# Patient Record
Sex: Female | Born: 1950
Health system: Southern US, Community
[De-identification: ages and names within clinical notes are randomized; demographics above are authoritative.]

## PROBLEM LIST (undated history)

## (undated) DIAGNOSIS — R51 Headache: Secondary | ICD-10-CM

## (undated) DIAGNOSIS — F419 Anxiety disorder, unspecified: Secondary | ICD-10-CM

## (undated) DIAGNOSIS — K219 Gastro-esophageal reflux disease without esophagitis: Secondary | ICD-10-CM

## (undated) DIAGNOSIS — R0602 Shortness of breath: Secondary | ICD-10-CM

## (undated) DIAGNOSIS — M1990 Unspecified osteoarthritis, unspecified site: Secondary | ICD-10-CM

## (undated) DIAGNOSIS — E079 Disorder of thyroid, unspecified: Secondary | ICD-10-CM

## (undated) HISTORY — DX: Disorder of thyroid, unspecified: E07.9

## (undated) HISTORY — DX: Gastro-esophageal reflux disease without esophagitis: K21.9

## (undated) HISTORY — DX: Unspecified osteoarthritis, unspecified site: M19.90

---
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
## 1998-07-25 ENCOUNTER — Emergency Department (HOSPITAL_COMMUNITY): Admission: EM | Admit: 1998-07-25 | Discharge: 1998-07-25 | Payer: Self-pay | Admitting: Emergency Medicine

## 1998-07-26 ENCOUNTER — Observation Stay (HOSPITAL_COMMUNITY): Admission: EM | Admit: 1998-07-26 | Discharge: 1998-07-27 | Payer: Self-pay | Admitting: Emergency Medicine

## 1998-10-12 ENCOUNTER — Ambulatory Visit (HOSPITAL_COMMUNITY): Admission: RE | Admit: 1998-10-12 | Discharge: 1998-10-12 | Payer: Self-pay | Admitting: Cardiology

## 1998-11-12 ENCOUNTER — Ambulatory Visit (HOSPITAL_COMMUNITY): Admission: RE | Admit: 1998-11-12 | Discharge: 1998-11-12 | Payer: Self-pay | Admitting: Family Medicine

## 1998-12-15 HISTORY — PX: MYOMECTOMY: SHX85

## 2000-03-03 ENCOUNTER — Other Ambulatory Visit: Admission: RE | Admit: 2000-03-03 | Discharge: 2000-03-03 | Payer: Self-pay | Admitting: *Deleted

## 2000-04-15 ENCOUNTER — Ambulatory Visit (HOSPITAL_COMMUNITY): Admission: RE | Admit: 2000-04-15 | Discharge: 2000-04-15 | Payer: Self-pay | Admitting: Family Medicine

## 2000-04-15 ENCOUNTER — Encounter: Payer: Self-pay | Admitting: Family Medicine

## 2002-05-23 ENCOUNTER — Encounter: Payer: Self-pay | Admitting: Family Medicine

## 2002-05-23 ENCOUNTER — Ambulatory Visit (HOSPITAL_COMMUNITY): Admission: RE | Admit: 2002-05-23 | Discharge: 2002-05-23 | Payer: Self-pay | Admitting: Family Medicine

## 2003-03-03 ENCOUNTER — Ambulatory Visit (HOSPITAL_COMMUNITY): Admission: RE | Admit: 2003-03-03 | Discharge: 2003-03-03 | Payer: Self-pay | Admitting: Gynecology

## 2003-03-03 ENCOUNTER — Encounter (INDEPENDENT_AMBULATORY_CARE_PROVIDER_SITE_OTHER): Payer: Self-pay

## 2003-09-04 ENCOUNTER — Ambulatory Visit (HOSPITAL_COMMUNITY): Admission: RE | Admit: 2003-09-04 | Discharge: 2003-09-04 | Payer: Self-pay | Admitting: Family Medicine

## 2003-09-04 ENCOUNTER — Encounter: Payer: Self-pay | Admitting: Family Medicine

## 2004-08-16 ENCOUNTER — Other Ambulatory Visit: Admission: RE | Admit: 2004-08-16 | Discharge: 2004-08-16 | Payer: Self-pay | Admitting: *Deleted

## 2005-11-20 ENCOUNTER — Other Ambulatory Visit: Admission: RE | Admit: 2005-11-20 | Discharge: 2005-11-20 | Payer: Self-pay | Admitting: *Deleted

## 2006-12-24 ENCOUNTER — Other Ambulatory Visit: Admission: RE | Admit: 2006-12-24 | Discharge: 2006-12-24 | Payer: Self-pay | Admitting: *Deleted

## 2008-01-04 ENCOUNTER — Other Ambulatory Visit: Admission: RE | Admit: 2008-01-04 | Discharge: 2008-01-04 | Payer: Self-pay | Admitting: *Deleted

## 2009-01-25 ENCOUNTER — Other Ambulatory Visit: Admission: RE | Admit: 2009-01-25 | Discharge: 2009-01-25 | Payer: Self-pay | Admitting: Family Medicine

## 2010-01-31 ENCOUNTER — Other Ambulatory Visit: Admission: RE | Admit: 2010-01-31 | Discharge: 2010-01-31 | Payer: Self-pay | Admitting: Family Medicine

## 2010-02-19 ENCOUNTER — Other Ambulatory Visit: Admission: RE | Admit: 2010-02-19 | Discharge: 2010-02-19 | Payer: Self-pay | Admitting: Family Medicine

## 2011-01-06 ENCOUNTER — Other Ambulatory Visit: Payer: Self-pay | Admitting: Family Medicine

## 2011-01-06 ENCOUNTER — Other Ambulatory Visit
Admission: RE | Admit: 2011-01-06 | Discharge: 2011-01-06 | Payer: Self-pay | Source: Home / Self Care | Admitting: Family Medicine

## 2011-01-29 ENCOUNTER — Other Ambulatory Visit: Payer: Self-pay | Admitting: Family Medicine

## 2011-01-29 DIAGNOSIS — Z1231 Encounter for screening mammogram for malignant neoplasm of breast: Secondary | ICD-10-CM

## 2011-01-29 DIAGNOSIS — Z780 Asymptomatic menopausal state: Secondary | ICD-10-CM

## 2011-05-02 NOTE — H&P (Signed)
NAME:  Kimberly Ryan, Kimberly Ryan                     ACCOUNT NO.:  [id number]   MEDICAL RECORD NO.:  [id number]                   PATIENT TYPE:   LOCATION:                                       FACILITY:   PHYSICIAN:  Ivor Costa. Farrel Gobble, M.D.              DATE OF BIRTH:   DATE OF ADMISSION:  DATE OF DISCHARGE:                                HISTORY & PHYSICAL   CHIEF COMPLAINT:  Postmenopausal bleeding.   HISTORY OF PRESENT ILLNESS:  The patient is a 60 year old [place] who had  been menopausal since December of 2002 and began having scattered red  painless spotting in the ensuing year and then began to have a very heavy  cycle back in November of 2003, which lasted for eight days. She had an  ultrasound performed that showed two uterine fibroids that measured 2.4 and  2.2 cm. She also was noted to have slightly thickened endometrium. She had  an ultrasound with a sono histogram performed in our office which confirmed  the presence of the two fibroids. The sono histogram, however, showed an  anterior wall polyp that measured 25 x 10 x 12 mm with a thickened base. ENB  performed and she had proliferative endometrium with areas of hyperplasia  without atypia and benign polyp. The patient had presented to the office for  laminaria placement on the 18th, in order to undergo D&C hysteroscopy on the  19th.   OB/GYN HISTORY:  She had regular menses and menopause. She has never been  pregnant. She has never been on hormone replacement.   PAST MEDICAL HISTORY:  Significant for some insomnia and left leg numbness.   PAST SURGICAL HISTORY:  Tonsillectomy in 1953 and rhinoplasty in 1952.   MEDICATIONS:  Remeron, Ambien, Zantac, Xanax, multivitamin, calcium, fish  oil and zinc.   ALLERGIES:  No known drug allergies.   SOCIAL HISTORY:  She is married. She drinks alcohol socially. No tobacco.  Light caffeine. Exercises five times per week.   FAMILY HISTORY:  Negative for breast, ovarian,  uterine, and colon cancer.   PHYSICAL EXAMINATION:  GENERAL: A well appearing female in no acute  distress.  HEART: Regular rate and rhythm.  LUNGS: Clear to auscultation and percussion.  ABDOMEN: Soft without mass or hernia.  GU: On GYN examination, she has normal female genitalia with slight  hypopigmentation. The BUS is negative. Vagina pink and moist. Bladder was  supported. Cervix without lesions. Bimanual examination reveals the uterus  is slightly bulky, mobile and nontender. Perineum is intact. By ultrasound,  her uterus measures 9.4 x 4.8. It is anteverted. Adnexa are negative.    ASSESSMENT:  Postmenopausal bleeding with evidence of an endometrial polyp.  The patient will present for [facility], hysteroscopy on the 19th. All questions  were addressed. Again, she had laminaria placed the day before.  Ivor Costa. Farrel Gobble, M.D.    THL/MEDQ  D:  03/02/2003  T:  03/02/2003  Job:  [id number]

## 2011-05-02 NOTE — Op Note (Signed)
NAME:  Kimberly Ryan, Kimberly Ryan                   ACCOUNT NO.:  [id number]   MEDICAL RECORD NO.:  [id number]                   PATIENT TYPE:  AMB   LOCATION:  [facility]                                  FACILITY:  [facility]   PHYSICIAN:  Ivor Costa. Farrel Gobble, M.D.              DATE OF BIRTH:  February 11, 1951   DATE OF PROCEDURE:  03/03/2003  DATE OF DISCHARGE:                                 OPERATIVE REPORT   PREOPERATIVE DIAGNOSES:  1. Postmenopausal bleeding.  2. Simple hyperplasia without atypia.  3. Endometrial polyp.   POSTOPERATIVE DIAGNOSES:  1. Postmenopausal bleeding.  2. Simple hyperplasia without atypia.  3. Endometrial polyp.   PROCEDURE:  1. Dilatation and curettage.  2. Hysteroscopy with resectoscope.   SURGEON:  Ivor Costa. Farrel Gobble, M.D.   ANESTHESIA:  General.   I&O DEFICIT:  3% sorbitol solution was 20 mL.   FINDINGS:  The uterine cavity was noted to be hyperemic sounding to 10.  There was a large anterior wall polyp.   PATHOLOGY:  Endometrial curettings and endometrial polyp.   COMPLICATIONS:  None.   PROCEDURE:  The patient was taken to the operating room.  Placed in a dorsal  lithotomy position prior to anesthesia secondary to leg/hip issue.  Once  comfortable then general anesthesia was induced.  Prior to prepping the  vagina the laminaria placed the night before was removed.  She was prepped  and draped in usual sterile fashion.  A sterile weighted speculum was placed  in the vagina.  The cervix was visualized, stabilized with a single tooth  tenaculum.  The cervix was noted to be dilated beyond the 22 [place] required  for the operative scope.  Therefore, the operative scope was advanced  through the cervix.  Immediately upon entering the cavity a large  endometrial polyp was noted on the anterior wall.  We were unable to  visualize the fundus.  The polyp was quite obstructive.  The polyp was then  removed with double loop cut cautery system.  After inspection and  confirmation that the polyp had been removed, the resectoscope was placed  back in the cavity.  The cavity was noted to be markedly hyperemic to the  point that the uterine fundus could not be visualized.  Unfortunately,  suboptimal seal because of over dilation from the laminaria would not allow  for good clearance of the blood with good uterine distention but the lining  did appear to be quite thickened.  An endometrial curetting was then  performed using a curette.  The resectoscope was advanced back through.  However, the cavity still remained dark and hyperemic.  However, the polyp  appeared to have been resected in its entirety and there was no bleeding  from the operative site.  Because we were unable to  clear the blood adequately and adequate samples were taken, we decided to  abort procedure at this point.  The instruments were removed.  The cervix  was stable.  The patient was extubated in the OR and transferred to the PACU  in stable condition.                                               Ivor Costa. Farrel Gobble, M.D.    THL/MEDQ  D:  03/03/2003  T:  03/03/2003  Job:  [id number]

## 2011-11-05 ENCOUNTER — Other Ambulatory Visit: Payer: Self-pay | Admitting: Family Medicine

## 2011-11-05 DIAGNOSIS — R599 Enlarged lymph nodes, unspecified: Secondary | ICD-10-CM

## 2011-11-12 ENCOUNTER — Ambulatory Visit
Admission: RE | Admit: 2011-11-12 | Discharge: 2011-11-12 | Disposition: A | Discharge status: Home / Self Care | Payer: BC Managed Care – PPO | Source: Ambulatory Visit | Attending: Family Medicine | Admitting: Family Medicine

## 2011-11-12 DIAGNOSIS — R599 Enlarged lymph nodes, unspecified: Secondary | ICD-10-CM

## 2011-12-04 ENCOUNTER — Ambulatory Visit (INDEPENDENT_AMBULATORY_CARE_PROVIDER_SITE_OTHER): Payer: BC Managed Care – PPO | Admitting: General Surgery

## 2011-12-17 ENCOUNTER — Encounter (INDEPENDENT_AMBULATORY_CARE_PROVIDER_SITE_OTHER): Payer: Self-pay | Admitting: General Surgery

## 2011-12-18 ENCOUNTER — Telehealth (INDEPENDENT_AMBULATORY_CARE_PROVIDER_SITE_OTHER): Payer: Self-pay

## 2011-12-18 NOTE — Telephone Encounter (Signed)
I have patient's ultrasound in my notes for tomorrow. US shows thyroid gland normal in size with multiple nodules, largest 1.3 cm. Palpable abnormality, normal lymph node. No adenopathy. This will be available for clinic tomorrow.

## 2011-12-18 NOTE — Telephone Encounter (Signed)
Dr. Andrey Campanile is requesting you try to track down a thyroid US report on patient done end of November.  Completed results are not in Epic.

## 2011-12-19 ENCOUNTER — Encounter (INDEPENDENT_AMBULATORY_CARE_PROVIDER_SITE_OTHER): Payer: Self-pay | Admitting: General Surgery

## 2011-12-19 ENCOUNTER — Ambulatory Visit (INDEPENDENT_AMBULATORY_CARE_PROVIDER_SITE_OTHER): Payer: BC Managed Care – PPO | Admitting: General Surgery

## 2011-12-19 VITALS — BP 112/82 | HR 72 | Temp 97.4°F | Resp 18 | Ht 66.5 in | Wt 220.0 lb

## 2011-12-19 DIAGNOSIS — E042 Nontoxic multinodular goiter: Secondary | ICD-10-CM

## 2011-12-19 NOTE — Progress Notes (Signed)
Patient ID: Kimberly Ryan, female   DOB: [birth year], 61 y.o.   MRN: [id number]  Chief Complaint  Patient presents with  . Other    new pt- eval thyroid nodules    HPI Kimberly Ryan is a 61 y.o. female.   HPI 61 year old Caucasian female referred by Dr. Juluis Rainier for evaluation of thyroid nodules. The patient states that it all started back in August when she noticed a knot on the back of her neck at the base of her hairline. She was told it was an enlarged lymph node. She was given a trial of antibiotics for 10 days which decreased the size of the lymph node however it never went back down to normal size. She ultimately underwent an ultrasound of her neck which did not reveal any lymphadenopathy however, it did show bilateral thyroid nodules.  She denies any fevers, chills, weight change, night sweats, trauma to the area, palpitations, diarrhea, constipation, voice changes, trouble swallowing, neck or chest irradiation, or family history of thyroid cancer. She states that she had a hyperfunctioning thyroid several years ago and was seen by an endocrinologist. She states that endocrinologist told her that her thyroid level was abnormal because of the iodine in her multivitamin. Her thyroid function normalized in several months after she stop taking that multivitamin.   Past Medical History  Diagnosis Date  . Arthritis   . GERD (gastroesophageal reflux disease)   . Thyroid disease     Past Surgical History  Procedure Date  . Rhinoplasty 1953  . Tonsillectomy 1954  . Myomectomy 2000    Family History  Problem Relation Age of Onset  . Heart disease Father     Social History History  Substance Use Topics  . Smoking status: Former Games developer  . Smokeless tobacco: Not on file   Comment: quit 25 yrs ago  . Alcohol Use: Yes    Allergies  Allergen Reactions  . Keflex     Current Outpatient Prescriptions  Medication Sig Dispense Refill  . ALPRAZolam (XANAX)  0.5 MG tablet Take 0.5 mg by mouth as needed.        Marland Kitchen b complex vitamins tablet Take 1 tablet by mouth daily.        . cetirizine (ZYRTEC) 10 MG tablet Take 10 mg by mouth daily.        . cholecalciferol (VITAMIN D) 400 UNITS TABS Take by mouth.        . diphenhydrAMINE (SOMINEX) 25 MG tablet Take 25 mg by mouth as needed.        . fish oil-omega-3 fatty acids 1000 MG capsule Take 2 g by mouth daily.        Marland Kitchen ibandronate (BONIVA) 150 MG tablet Take 150 mg by mouth every 30 (thirty) days. Take in the morning with a full glass of water, on an empty stomach, and do not take anything else by mouth or lie down for the next 30 min.       Marland Kitchen ibuprofen (ADVIL,MOTRIN) 400 MG tablet Take 400 mg by mouth every 6 (six) hours as needed.        . mirtazapine (REMERON) 7.5 MG tablet Take 7.5 mg by mouth at bedtime.        . Multiple Vitamin (MULTIVITAMIN) tablet Take 1 tablet by mouth daily.        . ranitidine (ZANTAC) 150 MG tablet Take 150 mg by mouth 2 (two) times daily.        . Testosterone Propionate  2 % CREA Place 1 application onto the skin 3 (three) times a week.        . vitamin C (ASCORBIC ACID) 500 MG tablet Take 500 mg by mouth daily.        Marland Kitchen zolpidem (AMBIEN) 10 MG tablet Take 10 mg by mouth at bedtime as needed.          Review of Systems Review of Systems  Constitutional: Negative for fever, chills, activity change, appetite change, fatigue and unexpected weight change.  HENT: Negative for congestion, sneezing, trouble swallowing, neck pain, neck stiffness and voice change.   Eyes: Negative for photophobia, discharge and visual disturbance.  Respiratory: Negative for apnea, chest tightness and shortness of breath.   Cardiovascular: Negative for chest pain and palpitations.  Gastrointestinal: Negative for nausea, abdominal pain, diarrhea, constipation and abdominal distention.  Genitourinary: Negative for frequency and difficulty urinating.  Musculoskeletal: Positive for back pain (some  intermittent low back pain).  Skin: Negative for color change and rash.  Neurological: Negative for dizziness, tremors, seizures, weakness and light-headedness.  Hematological: Negative for adenopathy.  Psychiatric/Behavioral: The patient is nervous/anxious (h/o of anxiety ).     Blood pressure 112/82, pulse 72, temperature 97.4 F (36.3 C), temperature source Temporal, resp. rate 18, height 5' 6.5" (1.689 m), weight 220 lb (99.791 kg).  Physical Exam Physical Exam  Vitals reviewed. Constitutional: She is oriented to person, place, and time. She appears well-developed and well-nourished. No distress.  HENT:  Head: Normocephalic and atraumatic.  Nose: Nose normal.  Mouth/Throat: No oropharyngeal exudate.  Eyes: Conjunctivae and EOM are normal. No scleral icterus.  Neck: Normal range of motion. Neck supple. No JVD present. No tracheal deviation present. No thyromegaly present.         At base of Rt hairline is where pt reports enlarged lump- no overlying skin changes, no palpable abnormality  Cardiovascular: Normal rate, regular rhythm, normal heart sounds and intact distal pulses.   Pulmonary/Chest: Effort normal and breath sounds normal. No respiratory distress. She has no wheezes.  Abdominal: Soft. Bowel sounds are normal. She exhibits no distension. There is no tenderness.       obese  Musculoskeletal: Normal range of motion. She exhibits no edema and no tenderness.  Lymphadenopathy:       Head (right side): No posterior auricular adenopathy present.       Head (left side): No posterior auricular adenopathy present.    She has no cervical adenopathy.    She has no axillary adenopathy.       Right: No inguinal and no supraclavicular adenopathy present.       Left: No inguinal and no supraclavicular adenopathy present.  Neurological: She is alert and oriented to person, place, and time. She exhibits normal muscle tone.  Skin: Skin is warm and dry. No rash noted. She is not  diaphoretic. No erythema.  Psychiatric: She has a normal mood and affect. Her behavior is normal. Judgment and thought content normal.    Data Reviewed Dr Zachery Dauer note from 11/21  THYROID ULTRASOUND  Technique: Ultrasound examination of the thyroid gland and adjacent  soft tissues was performed.  Comparison: None.  Findings:  Right thyroid lobe: 6.0 x 1.1 x 1.8 cm.  Left thyroid lobe: 5.3 x 1.6 x 1.9 cm.  Isthmus: 3 mm in thickness.  Focal nodules: The echogenicity of the thyroid gland is slightly  inhomogeneous. There are thyroid nodules bilaterally. The largest  solid nodule is in the lower pole of the  left lobe measuring 1.3 x  1.0 x 1.2 cm. Additional nodules on the left are present of no  more than 8 mm in diameter. On the right the largest solid nodule  is in the lower pole measuring 1.3 x 0.7 x 1.0 cm with possible  small calcifications. Multiple small nodular areas are present  throughout the right lobe of less than 6 mm in diameter.  Lymphadenopathy: There is an apparent lymph node at the palpable  site measuring no more than 4 mm in short axis diameter. No  enlarged nodes are seen.  IMPRESSION:  1. The thyroid gland is normal in size with multiple nodules, the  largest measuring 1.3 cm as described above.  2. The palpable abnormality represents a normal-sized lymph node.  No adenopathy is seen.  Labs from 2/12  Assessment    B/l thyroid nodules    Plan    We discussed thyroid disease. The patient was given education material. We discussed the management and workup and evaluation of thyroid nodules. The patient has 2 nodules that are larger than 1 cm in size. The one on the right is possibly associated with some microcalcifications. We discussed several management options.  Regardless, the patient needs her thyroid function level checked. She states that she is due to have a complete physical in several weeks. I will ask Dr. Zachery Dauer to check her TSH level at that  time.  We also discussed proceeding with an FNA biopsy of the right sided thyroid nodule versus observation. We discussed the risks and benefits of fine needle aspiration. My suspicion for a malignancy is low. However, we discussed that since her thyroid nodule is larger than 1 cm as well as the fact that there are some possible microcalcifications she will need either to proceed with an FNA biopsy or a short interval followup ultrasound of her neck. The patient has elected for short interval followup ultrasound of her neck. We will schedule her for a neck ultrasound in 6 months to monitor her thyroid nodules.  If her TSH level is abnormal then she will need to be referred back to Dr. Talmage Nap for evaluation and management.  [name]. Andrey Campanile, MD, FACS General, Bariatric, & Minimally Invasive Surgery [facility], [place]        [facility] M 12/19/2011, 10:21 AM

## 2011-12-19 NOTE — Patient Instructions (Signed)
We will ask Dr Zachery Dauer to check your TSH level when you have your physical performed in several weeks to make sure your thyroid is functioning normally and not causing the thyroid nodules  Thyroid Diseases Your thyroid is a butterfly-shaped gland in your neck. It is located just above your collarbone. It is one of your endocrine glands, which make hormones. The thyroid helps set your metabolism. Metabolism is how your body gets energy from the foods you eat.  Millions of people have thyroid diseases. Women experience thyroid problems more often than men. In fact, overactive thyroid problems (hyperthyroidism) occur in 1% of all women. If you have a thyroid disease, your body may use energy more slowly or quickly than it should.  Thyroid problems also include an immune disease where your body reacts against your thyroid gland (called thyroiditis). A different problem involves lumps and bumps (called nodules) that develop in the gland. The nodules are usually, but not always, noncancerous. THE MOST COMMON THYROID PROBLEMS AND CAUSES ARE DISCUSSED BELOW There are many causes for thyroid problems. Treatment depends upon the exact diagnosis and includes trying to reset your body's metabolism to a normal rate. Hyperthyroidism Too much thyroid hormone from an overactive thyroid gland is called hyperthyroidism. In hyperthyroidism, the body's metabolism speeds up. One of the most frequent forms of hyperthyroidism is known as Graves' disease. Graves' disease tends to run in families. Although Graves' is thought to be caused by a problem with the immune system, the exact nature of the genetic problem is unknown. Hypothyroidism Too little thyroid hormone from an underactive thyroid gland is called hypothyroidism. In hypothyroidism, the body's metabolism is slowed. Several things can cause this condition. Most causes affect the thyroid gland directly and hurt its ability to make enough hormone.  Rarely, there may be a  pituitary gland tumor (located near the base of the brain). The tumor can block the pituitary from producing thyroid-stimulating hormone (TSH). Your body makes TSH to stimulate the thyroid to work properly. If the pituitary does not make enough TSH, the thyroid fails to make enough hormones needed for good health. Whether the problem is caused by thyroid conditions or by the pituitary gland, the result is that the thyroid is not making enough hormones. Hypothyroidism causes many physical and mental processes to become sluggish. The body consumes less oxygen and produces less body heat. Thyroid Nodules A thyroid nodule is a small swelling or lump in the thyroid gland. They are common. These nodules represent either a growth of thyroid tissue or a fluid-filled cyst. Both form a lump in the thyroid gland. Almost half of all people will have tiny thyroid nodules at some point in their lives. Typically, these are not noticeable until they become large and affect normal thyroid size. Larger nodules that are greater than a half inch across (about 1 centimeter) occur in about 5 percent of people. Although most nodules are not cancerous, people who have them should seek medical care to rule out cancer. Also, some thyroid nodules may produce too much thyroid hormone or become too large. Large nodules or a large gland can interfere with breathing or swallowing or may cause neck discomfort. Other problems Other thyroid problems include cancer and thyroiditis. Thyroiditis is a malfunction of the body's immune system. Normally, the immune system works to defend the body against infection and other problems. When the immune system is not working properly, it may mistakenly attack normal cells, tissues, and organs. Examples of autoimmune diseases are Hashimoto's thyroiditis (  which causes low thyroid function) and Graves' disease (which causes excess thyroid function). SYMPTOMS  Symptoms vary greatly depending upon the exact  type of problem with the thyroid. Hyperthyroidism-is when your thyroid is too active and makes more thyroid hormone than your body needs. The most common cause is Graves' Disease. Too much thyroid hormone can cause some or all of the following symptoms:  Anxiety.   Irritability.   Difficulty sleeping.   Fatigue.   A rapid or irregular heartbeat.   A fine tremor of your hands or fingers.   An increase in perspiration.   Sensitivity to heat.   Weight loss, despite normal food intake.   Brittle hair.   Enlargement of your thyroid gland (goiter).   Light menstrual periods.   Frequent bowel movements.  Graves' disease can specifically cause eye and skin problems. The skin problems involve reddening and swelling of the skin, often on your shins and on the top of your feet. Eye problems can include the following:  Excess tearing and sensation of grit or sand in either or both eyes.   Reddened or inflamed eyes.   Widening of the space between your eyelids.   Swelling of the lids and tissues around the eyes.   Light sensitivity.   Ulcers on the cornea.   Double vision.   Limited eye movements.   Blurred or reduced vision.  Hypothyroidism- is when your thyroid gland is not active enough. This is more common than hyperthyroidism. Symptoms can vary a lot depending of the severity of the hormone deficiency. Symptoms may develop over a long period of time and can include several of the following:  Fatigue.   Sluggishness.   Increased sensitivity to cold.   Constipation.   Pale, dry skin.   A puffy face.   Hoarse voice.   High blood cholesterol level.   Unexplained weight gain.   Muscle aches, tenderness and stiffness.   Pain, stiffness or swelling in your joints.   Muscle weakness.   Heavier than normal menstrual periods.   Brittle fingernails and hair.   Depression.  Thyroid Nodules - most do not cause signs or symptoms. Occasionally, some may become  so large that you can feel or even see the swelling at the base of your neck. You may realize a lump or swelling is there when you are shaving or putting on makeup. Men might become aware of a nodule when shirt collars suddenly feel too tight. Some nodules produce too much thyroid hormone. This can produce the same symptoms as hyperthyroidism (see above). Thyroid nodules are seldom cancerous. However, a nodule is more likely to be malignant (cancerous) if it:  Grows quickly or feels hard.   Causes you to become hoarse or to have trouble swallowing or breathing.   Causes enlarged lymph nodes under your jaw or in your neck.  DIAGNOSIS  Because there are so many possible thyroid conditions, your caregiver may ask for a number of tests. They will do this in order to narrow down the exact diagnosis. These tests can include:  Blood and antibody tests.   Special thyroid scans using small, safe amounts of radioactive iodine.   Ultrasound of the thyroid gland (particularly if there is a nodule or lump).   Biopsy. This is usually done with a special needle. A needle biopsy is a procedure to obtain a sample of cells from the thyroid. The tissue will be tested in a lab and examined under a microscope.  TREATMENT  Treatment  depends on the exact diagnosis. Hyperthyroidism  Beta-blockers help relieve many of the symptoms.   Anti-thyroid medications prevent the thyroid from making excess hormones.   Radioactive iodine treatment can destroy overactive thyroid cells. The iodine can permanently decrease the amount of hormone produced.   Surgery to remove the thyroid gland.   Treatments for eye problems that come from Graves' disease also include medications and special eye surgery, if felt to be appropriate.  Hypothyroidism Thyroid replacement with levothyroxine is the mainstay of treatment. Treatment with thyroid replacement is usually lifelong and will require monitoring and adjustment from time to  time. Thyroid Nodules  Watchful waiting. If a small nodule causes no symptoms or signs of cancer on biopsy, then no treatment may be chosen at first. Re-exam and re-checking blood tests would be the recommended follow-up.   Anti-thyroid medications or radioactive iodine treatment may be recommended if the nodules produce too much thyroid hormone (see Treatment for Hyperthyroidism above).   Alcohol ablation. Injections of small amounts of ethyl alcohol (ethanol) can cause a non-cancerous nodule to shrink in size.   Surgery (see Treatment for Hyperthyroidism above).  HOME CARE INSTRUCTIONS   Take medications as instructed.   Follow through on recommended testing.  SEEK MEDICAL CARE IF:   You feel that you are developing symptoms of Hyperthyroidism or Hypothyroidism as described above.   You develop a new lump/nodule in the neck/thyroid area that you had not noticed before.   You feel that you are having side effects from medicines prescribed.   You develop trouble breathing or swallowing.  SEEK IMMEDIATE MEDICAL CARE IF:   You develop a fever of 102 F (38.9 C) or higher.   You develop severe sweating.   You develop palpitations and/or rapid heart beat.   You develop shortness of breath.   You develop nausea and vomiting.   You develop extreme shakiness.   You develop agitation.   You develop lightheadedness or have a fainting episode.  Document Released: 09/28/2007 Document Revised: 08/13/2011 Document Reviewed: 09/28/2007 [employer] Patient Information 2012 [place], [place].

## 2011-12-23 ENCOUNTER — Encounter (INDEPENDENT_AMBULATORY_CARE_PROVIDER_SITE_OTHER): Payer: Self-pay

## 2012-01-16 ENCOUNTER — Encounter (INDEPENDENT_AMBULATORY_CARE_PROVIDER_SITE_OTHER): Payer: Self-pay

## 2012-05-17 ENCOUNTER — Encounter (INDEPENDENT_AMBULATORY_CARE_PROVIDER_SITE_OTHER): Payer: Self-pay | Admitting: General Surgery

## 2012-06-28 ENCOUNTER — Telehealth (INDEPENDENT_AMBULATORY_CARE_PROVIDER_SITE_OTHER): Payer: Self-pay | Admitting: General Surgery

## 2012-06-28 ENCOUNTER — Ambulatory Visit
Admission: RE | Admit: 2012-06-28 | Discharge: 2012-06-28 | Disposition: A | Discharge status: Home / Self Care | Payer: BC Managed Care – PPO | Source: Ambulatory Visit | Attending: General Surgery | Admitting: General Surgery

## 2012-06-28 DIAGNOSIS — E041 Nontoxic single thyroid nodule: Secondary | ICD-10-CM

## 2012-06-28 NOTE — Telephone Encounter (Signed)
Message copied by Liliana Cline on Mon Jun 28, 2012  5:55 PM ------      Message from: Andrey Campanile, ERIC M      Created: Mon Jun 28, 2012  4:51 PM       Overall Thyroid is same except one of the nodules on the left has grown slightly. It now measures 1.6cm. I recommend u/s guided FNA of this nodule since it is greater than 1.

## 2012-06-29 NOTE — Telephone Encounter (Signed)
Patient aware we are recommending biopsy of left thyroid nodule due to slight increase in size. Order placed and message left with Tami at [facility]. Patient prefers Wed 07/07/12 am or Thursday 07/08/12 am.

## 2012-06-30 ENCOUNTER — Other Ambulatory Visit: Payer: BC Managed Care – PPO

## 2012-07-07 ENCOUNTER — Other Ambulatory Visit (HOSPITAL_COMMUNITY)
Admission: RE | Admit: 2012-07-07 | Discharge: 2012-07-07 | Disposition: A | Discharge status: Home / Self Care | Payer: BC Managed Care – PPO | Source: Ambulatory Visit | Attending: Interventional Radiology | Admitting: Interventional Radiology

## 2012-07-07 ENCOUNTER — Ambulatory Visit
Admission: RE | Admit: 2012-07-07 | Discharge: 2012-07-07 | Disposition: A | Discharge status: Home / Self Care | Payer: BC Managed Care – PPO | Source: Ambulatory Visit | Attending: General Surgery | Admitting: General Surgery

## 2012-07-07 DIAGNOSIS — E049 Nontoxic goiter, unspecified: Secondary | ICD-10-CM | POA: Insufficient documentation

## 2012-07-07 DIAGNOSIS — E041 Nontoxic single thyroid nodule: Secondary | ICD-10-CM

## 2012-07-09 ENCOUNTER — Telehealth (INDEPENDENT_AMBULATORY_CARE_PROVIDER_SITE_OTHER): Payer: Self-pay | Admitting: General Surgery

## 2012-07-09 NOTE — Telephone Encounter (Signed)
Had message to contact pt regarding thyroid bx results. Got voicemail. I LM that I would call her on Monday.

## 2012-07-15 ENCOUNTER — Ambulatory Visit (INDEPENDENT_AMBULATORY_CARE_PROVIDER_SITE_OTHER): Payer: BC Managed Care – PPO | Admitting: General Surgery

## 2012-07-15 ENCOUNTER — Encounter (INDEPENDENT_AMBULATORY_CARE_PROVIDER_SITE_OTHER): Payer: Self-pay | Admitting: General Surgery

## 2012-07-15 VITALS — BP 118/70 | HR 76 | Temp 97.9°F | Resp 19 | Ht 66.5 in | Wt 225.0 lb

## 2012-07-15 DIAGNOSIS — D449 Neoplasm of uncertain behavior of unspecified endocrine gland: Secondary | ICD-10-CM

## 2012-07-15 DIAGNOSIS — D440 Neoplasm of uncertain behavior of thyroid gland: Secondary | ICD-10-CM

## 2012-07-15 NOTE — Addendum Note (Signed)
Addended byLiliana Cline on: 07/15/2012 01:53 PM   Modules accepted: Orders

## 2012-07-15 NOTE — Progress Notes (Signed)
Patient ID: Kimberly Ryan, female   DOB: Apr 06, 1951, 61 y.o.   MRN: [id number]  Chief Complaint  Patient presents with  . Thyroid Nodule    HPI Kimberly Ryan is a 60 y.o. female.   HPI 61 year old Caucasian female comes in for followup after undergoing a repeat thyroid ultrasound in July. I initially met her in January 2013 after she had a thyroid ultrasound which revealed bilateral thyroid nodules. She had one nodule on each side that was a little bit over 1 cm in size. She had no lymphadenopathy. There is no other concerning ultrasound features. She also had normal thyroid function at that time. We decided that we would perform a six-month followup ultrasound to monitor the nodules. The left nodule increased slightly in size now 1.6 cm. She underwent an ultrasound-guided FNA biopsy which revealed a follicular lesion with hyperplastic cells. She is here today to discuss the results. She denies any new medical problems. She denies any sore throat, voice change, lymphadenopathy, fever, chills, difficulty swallowing, depression, constipation, sweats, or palpitations.   Past Medical History  Diagnosis Date  . Arthritis   . GERD (gastroesophageal reflux disease)   . Thyroid disease     Past Surgical History  Procedure Date  . Rhinoplasty 1953  . Tonsillectomy 1954  . Myomectomy 2000    Family History  Problem Relation Age of Onset  . Heart disease Father     Social History History  Substance Use Topics  . Smoking status: Former Games developer  . Smokeless tobacco: Not on file   Comment: quit 25 yrs ago  . Alcohol Use: Yes    Allergies  Allergen Reactions  . Cephalexin     Current Outpatient Prescriptions  Medication Sig Dispense Refill  . ALPRAZolam (XANAX) 0.5 MG tablet Take 0.5 mg by mouth as needed.        Marland Kitchen b complex vitamins tablet Take 1 tablet by mouth daily.        . cetirizine (ZYRTEC) 10 MG tablet Take 10 mg by mouth daily.        . cholecalciferol  (VITAMIN D) 400 UNITS TABS Take by mouth.        . diphenhydrAMINE (SOMINEX) 25 MG tablet Take 25 mg by mouth as needed.        . fish oil-omega-3 fatty acids 1000 MG capsule Take 2 g by mouth daily.        Marland Kitchen ibuprofen (ADVIL,MOTRIN) 400 MG tablet Take 400 mg by mouth every 6 (six) hours as needed.        . mirtazapine (REMERON) 7.5 MG tablet Take 7.5 mg by mouth at bedtime.        . Multiple Vitamin (MULTIVITAMIN) tablet Take 1 tablet by mouth daily.        . ranitidine (ZANTAC) 150 MG tablet Take 150 mg by mouth 2 (two) times daily.        . Testosterone Propionate 2 % CREA Place 1 application onto the skin 3 (three) times a week.        . vitamin C (ASCORBIC ACID) 500 MG tablet Take 500 mg by mouth daily.        Marland Kitchen zolpidem (AMBIEN) 10 MG tablet Take 10 mg by mouth at bedtime as needed.          Review of Systems Review of Systems  Constitutional: Negative for fever, chills and unexpected weight change.  HENT: Negative for hearing loss, congestion, sore throat, trouble swallowing and voice change.  Eyes: Negative for visual disturbance.  Respiratory: Negative for cough and wheezing.   Cardiovascular: Negative for chest pain, palpitations and leg swelling.  Gastrointestinal: Negative for nausea, vomiting, abdominal pain, diarrhea, constipation, blood in stool, abdominal distention and anal bleeding.  Genitourinary: Negative for hematuria, vaginal bleeding and difficulty urinating.  Musculoskeletal: Negative for arthralgias.  Skin: Negative for rash and wound.  Neurological: Negative for seizures, syncope and headaches.  Hematological: Negative for adenopathy. Does not bruise/bleed easily.  Psychiatric/Behavioral: Negative for confusion.    Blood pressure 118/70, pulse 76, temperature 97.9 F (36.6 C), temperature source Temporal, resp. rate 19, height 5' 6.5" (1.689 m), weight 225 lb (102.059 kg).  Physical Exam Physical Exam  Vitals reviewed. Constitutional: She is oriented to  person, place, and time. She appears well-developed and well-nourished. No distress.  HENT:  Head: Normocephalic and atraumatic.  Right Ear: External ear normal.  Left Ear: External ear normal.  Eyes: Conjunctivae are normal. No scleral icterus.  Neck: Normal range of motion. Neck supple. No tracheal deviation present. No thyromegaly present.  Cardiovascular: Normal rate, regular rhythm and normal heart sounds.   Pulmonary/Chest: Effort normal and breath sounds normal. No respiratory distress. She has no wheezes.  Abdominal: She exhibits no distension.  Musculoskeletal: She exhibits no edema and no tenderness.  Lymphadenopathy:       Head (right side): No submental, no submandibular, no preauricular and no posterior auricular adenopathy present.       Head (left side): No submental, no submandibular, no preauricular and no posterior auricular adenopathy present.    She has no cervical adenopathy.       Right: No supraclavicular adenopathy present.       Left: No supraclavicular adenopathy present.  Neurological: She is alert and oriented to person, place, and time.  Skin: Skin is warm and dry. No rash noted. She is not diaphoretic. No erythema. No pallor.  Psychiatric: She has a normal mood and affect. Her behavior is normal. Judgment and thought content normal.    Data Reviewed My note from Jan 2013 Labs from PCP - normal TSH, FT4  THYROID ULTRASOUND 06/2012  Technique: Ultrasound examination of the thyroid gland and adjacent  soft tissues was performed.   Comparison: Ultrasound of the thyroid of 11/12/2011   Findings:   Right thyroid lobe: 5.9 x 1.3 x 2.0 cm. (Previously 6.0 x 1.1 x  1.8 cm).  Left thyroid lobe: 5.3 x 1.6 x 1.7 cm. (Previously 5.3 x 1.6 x  1.9 cm).  Isthmus: 2.6 mm in thickness.   Focal nodules: The echogenicity of the thyroid gland is  inhomogeneous. There are multiple nodules bilaterally. The  dominant solid nodule is in the lower pole of the left lobe    measuring 1.6 x 1.1 x 1.3 cm compared to prior measurements of 1.3  x 1.0 x 1.2 cm. Findings meet consensus criteria for biopsy.  Ultrasound-guided fine needle aspiration should be considered, as  per the consensus statement: Management of Thyroid Nodules Detected  at [place]: Society of Radiologists in Ultrasound Consensus Conference  Statement. Radiology 2005; [id number]. A dominant solid nodule in  the lower pole of the right measures 1.4 x 0.8 x 1.3 cm compared to  prior measurements of 1.3 x 0.7 x 1.0 cm. Multiple smaller nodules  are present bilaterally of no more than 10 mm in diameter.  Lymphadenopathy: None visualized.   IMPRESSION:  The dominant nodule in the lower pole of the left lobe now measures  1.6 cm in  maximum diameter and does meet criteria for biopsy.  Consider percutaneous biopsy.   Assessment    Bilateral thyroid nodules Left Follicular lesion - FNA c/w hyperplastic cells    Plan    We reviewed the ultrasound findings from January as well as from July. We also discussed the results of her FNA biopsy. Based on her biopsy results with hyperplastic cells, I recommended a left thyroid lobectomy. We discussed the risk and benefits of surgery. We discussed the risk of bleeding, infection, injury to surrounding structures such as the laryngeal  Nerve, hematoma formation, blood clot formation, general anesthesia risk, scarring, and possible need for additional procedures. We also discussed the possibility of having to return to the operating room for completion thyroidectomy if her pathology report confirms an aggressive cancer. We also discussed the option of continued observation with a short followup ultrasound and repeat biopsy. I explained that her FNA biopsy did reveal some abnormal cells. I explained that this does raise her risk of a malignancy. We discussed the typical postoperative care. we also discussed getting a second opinion from an endocrinologist. She is leaning  toward surgery however she is interested in hearing and listening to an endocrinologist. We will refer her to endocrinology. She has requested that we go ahead and schedule her surgery but she would like to see the endocrinologist prior to her surgical date.  [name]. Andrey Campanile, MD, FACS General, Bariatric, & Minimally Invasive Surgery [facility], [place]        [facility] M 07/15/2012, 1:02 PM

## 2012-07-15 NOTE — Patient Instructions (Signed)
Please call our office to schedule your surgery once you have learned the date of your endocrine appt

## 2012-08-03 ENCOUNTER — Encounter (HOSPITAL_COMMUNITY): Payer: Self-pay | Admitting: Pharmacy Technician

## 2012-08-06 ENCOUNTER — Ambulatory Visit (INDEPENDENT_AMBULATORY_CARE_PROVIDER_SITE_OTHER): Payer: BC Managed Care – PPO | Admitting: Endocrinology

## 2012-08-06 ENCOUNTER — Encounter: Payer: Self-pay | Admitting: Endocrinology

## 2012-08-06 VITALS — BP 102/62 | HR 80 | Temp 97.8°F | Ht 66.0 in | Wt 226.0 lb

## 2012-08-06 DIAGNOSIS — E042 Nontoxic multinodular goiter: Secondary | ICD-10-CM

## 2012-08-06 NOTE — Patient Instructions (Addendum)
i would undergo the surgery if i was you. i would be happy to see you back here whenever you want.  The ultrasound of the remaining right lobe should be rechecked in 1 year.

## 2012-08-06 NOTE — Progress Notes (Signed)
Subjective:    Patient ID: Kimberly Ryan, female    DOB: [birth year], 61 y.o.   MRN: [id number]  HPI Pt was noted to have 1 year of a slight nodule at the right posterior neck, but no assoc pain.  Dr Zachery Dauer noted a nodular thyroid.  She had ultrasound then.  A repeat US recently noted interval growth of the largest nodule, and bx was then done.  Past Medical History  Diagnosis Date  . Arthritis   . GERD (gastroesophageal reflux disease)   . Thyroid disease     Past Surgical History  Procedure Date  . Rhinoplasty 1953  . Tonsillectomy 1954  . Myomectomy 2000    History   Social History  . Marital Status: Married    Spouse Name: N/A    Number of Children: N/A  . Years of Education: N/A   Occupational History  . Not on file.   Social History Main Topics  . Smoking status: Former Games developer  . Smokeless tobacco: Not on file   Comment: quit 25 yrs ago  . Alcohol Use: Yes  . Drug Use: No  . Sexually Active:    Other Topics Concern  . Not on file   Social History Narrative  . No narrative on file    Current Outpatient Prescriptions on File Prior to Visit  Medication Sig Dispense Refill  . ALPRAZolam (XANAX) 0.5 MG tablet Take 0.5 mg by mouth 2 (two) times daily as needed. Once during the day and at bedtime for anxiety and sleep      . b complex vitamins tablet Take 1 tablet by mouth daily.        . beta carotene w/minerals (OCUVITE) tablet Take 1 tablet by mouth daily.      . calcium carbonate (OS-CAL) 600 MG TABS Take 600 mg by mouth daily.      . cholecalciferol (VITAMIN D) 1000 UNITS tablet Take 1,000 Units by mouth daily.      Marland Kitchen desonide (DESOWEN) 0.05 % ointment Apply 1 application topically 2 (two) times daily. Apply to vulva region      . diphenhydrAMINE (BENADRYL) 25 mg capsule Take 25 mg by mouth at bedtime as needed. For sleep      . Docosahexaenoic Acid (DHA OMEGA 3 PO) Take 2 capsules by mouth.      Marland Kitchen ibuprofen (ADVIL,MOTRIN) 400 MG tablet Take 400 mg  by mouth every 6 (six) hours as needed. For pain      . mirtazapine (REMERON) 7.5 MG tablet Take 7.5 mg by mouth at bedtime.       . Multiple Vitamin (MULTIVITAMIN) tablet Take 0.5 tablets by mouth daily.       Marland Kitchen OVER THE COUNTER MEDICATION Take 1 tablet by mouth daily. Ultra jarro-dophilus      . OVER THE COUNTER MEDICATION Take 1 tablet by mouth 4 (four) times daily. Immpower immune booster - mushroom      . ranitidine (ZANTAC) 150 MG tablet Take 150 mg by mouth at bedtime as needed. For acid reflux at bedtime      . vitamin C (ASCORBIC ACID) 500 MG tablet Take 500 mg by mouth daily as needed.       . zolpidem (AMBIEN) 10 MG tablet Take 5 mg by mouth at bedtime as needed. For sleep        Allergies  Allergen Reactions  . Cephalexin Itching, Rash and Other (See Comments)    Eyes were tearing up    Family  History  Problem Relation Age of Onset  . Heart disease Father   both parents had hypothyroidism (both deceased)  BP 102/62  Pulse 80  Temp 97.8 F (36.6 C) (Oral)  Ht 5\' 6"  (1.676 m)  Wt 226 lb (102.513 kg)  BMI 36.48 kg/m2  SpO2 95%  Review of Systems  Constitutional: Negative for fever and unexpected weight change.  HENT:       Recent sore throat is better now  Eyes: Negative for visual disturbance.  Respiratory: Negative for shortness of breath.   Cardiovascular: Negative for chest pain.  Gastrointestinal: Negative for anal bleeding.  Genitourinary: Negative for hematuria.  denies headache, hoarseness, palpitations, sob, diarrhea, polyuria, myalgias, excessive diaphoresis, numbness, tremor, anxiety, menopausal sxs, easy bruising, and rhinorrhea.    Objective:   Physical Exam VS: see vs page GEN: no distress HEAD: head: no deformity eyes: no periorbital swelling, no proptosis external nose and ears are normal mouth: no lesion seen NECK: pt may have a palpable goiter, but i can't tell details.  i do not appreciate the swollen area at the right posterior neck.    CHEST WALL: no deformity LUNGS:  Clear to auscultation CV: reg rate and rhythm, no murmur MUSCULOSKELETAL: muscle bulk and strength are grossly normal.  no obvious joint swelling.  gait is normal and steady EXTEMITIES: no deformity.  no edema. PULSES: dorsalis pedis intact bilat.   NEURO:  cn 2-12 grossly intact.   readily moves all 4's.  sensation is intact to touch on all 4's.  No tremor.   SKIN:  Normal texture and temperature.  No rash or suspicious lesion is visible.   NODES:  None palpable at the neck. PSYCH: alert, oriented x3.  Does not appear anxious nor depressed.   (i reviewed [place] and cytology reports) (pt says thyroid blood test was normal in late 2012, at [place]).      Assessment & Plan:  Multinodular goiter.  Due to inconclusive cytology and interval increase in size, i recommend diagnostic left lobectomy. Sore throat, not thyroid-related Right posterior neck nodule, not thyroid-related

## 2012-08-10 ENCOUNTER — Telehealth (INDEPENDENT_AMBULATORY_CARE_PROVIDER_SITE_OTHER): Payer: Self-pay | Admitting: General Surgery

## 2012-08-10 NOTE — Telephone Encounter (Signed)
Message copied by Liliana Cline on Tue Aug 10, 2012  9:33 AM ------      Message from: [place], [place]      Created: Mon Aug 09, 2012  3:10 PM      Regarding: Dr Andrey Campanile      Contact: [phone]       Pt has question regarding her sx please call her concerning this matter.            Thanks

## 2012-08-10 NOTE — Telephone Encounter (Signed)
Patient called after speaking with Dr Everardo All and wanted to make sure that during her thyroid surgery they will perform a frozen section on the left and if it looks like cancer they will go ahead and remove the right. I told her based on Dr Tawana Scale last office note that looks like the plan and she should sign a consent form at her pre-op appt that states this. She will call with any other questions.

## 2012-08-11 NOTE — Patient Instructions (Signed)
20 Kimberly Ryan  08/11/2012   Your procedure is scheduled on:  08/17/12 [id number]  Report to [facility] at 0715 AM.  Call this number if you have problems the morning of surgery: [phone]   Remember:   Do not eat food:After Midnight.  May have clear liquids:until Midnight .  Marland Kitchen  Take these medicines the morning of surgery with A SIP OF WATER:    Do not wear jewelry, make-up or nail polish.  Do not wear lotions, powders, or perfumes. .  Do not shave 48 hours prior to surgery. .  Do not bring valuables to the hospital.  Contacts, dentures or bridgework may not be worn into surgery.  Leave suitcase in the car. After surgery it may be brought to your room.  For patients admitted to the hospital, checkout time is 11:00 AM the day of discharge.     Special Instructions: CHG Shower Use Special Wash: 1/2 bottle night before surgery and 1/2 bottle morning of surgery. shower chin to toes with CHG. Wash face and private parts with regular soap.    Please read over the following fact sheets that you were given: MRSA Information, coughing and deep breathing exercises, leg exercises

## 2012-08-12 ENCOUNTER — Encounter (HOSPITAL_COMMUNITY)
Admission: RE | Admit: 2012-08-12 | Discharge: 2012-08-12 | Disposition: A | Discharge status: Home / Self Care | Payer: BC Managed Care – PPO | Source: Ambulatory Visit | Attending: General Surgery | Admitting: General Surgery

## 2012-08-12 ENCOUNTER — Ambulatory Visit (HOSPITAL_COMMUNITY)
Admission: RE | Admit: 2012-08-12 | Discharge: 2012-08-12 | Disposition: A | Discharge status: Home / Self Care | Payer: BC Managed Care – PPO | Source: Ambulatory Visit | Attending: General Surgery | Admitting: General Surgery

## 2012-08-12 ENCOUNTER — Encounter (HOSPITAL_COMMUNITY): Payer: Self-pay

## 2012-08-12 DIAGNOSIS — E041 Nontoxic single thyroid nodule: Secondary | ICD-10-CM | POA: Insufficient documentation

## 2012-08-12 DIAGNOSIS — Z01812 Encounter for preprocedural laboratory examination: Secondary | ICD-10-CM | POA: Insufficient documentation

## 2012-08-12 DIAGNOSIS — K219 Gastro-esophageal reflux disease without esophagitis: Secondary | ICD-10-CM | POA: Insufficient documentation

## 2012-08-12 DIAGNOSIS — Z79899 Other long term (current) drug therapy: Secondary | ICD-10-CM | POA: Insufficient documentation

## 2012-08-12 HISTORY — DX: Anxiety disorder, unspecified: F41.9

## 2012-08-12 HISTORY — DX: Headache: R51

## 2012-08-12 HISTORY — DX: Shortness of breath: R06.02

## 2012-08-12 LAB — BASIC METABOLIC PANEL
GFR calc Af Amer: 85 mL/min — ABNORMAL LOW (ref >90)
GFR calc non Af Amer: 74 mL/min — ABNORMAL LOW (ref >90)
Potassium: 4 mEq/L (ref 3.5–5.1)
Sodium: 139 mEq/L (ref 135–145)

## 2012-08-12 LAB — CBC WITH DIFFERENTIAL/PLATELET
Hemoglobin: 13 g/dL (ref 12.0–15.0)
Lymphs Abs: 1.6 10*3/uL (ref 0.7–4.0)
Monocytes Relative: 8 % (ref 3–12)
Neutro Abs: 5.1 10*3/uL (ref 1.7–7.7)
Neutrophils Relative %: 69 % (ref 43–77)
RBC: 4.23 MIL/uL (ref 3.87–5.11)
WBC: 7.4 10*3/uL (ref 4.0–10.5)

## 2012-08-12 LAB — SURGICAL PCR SCREEN: Staphylococcus aureus: NEGATIVE

## 2012-08-17 ENCOUNTER — Encounter (HOSPITAL_COMMUNITY): Payer: Self-pay | Admitting: Anesthesiology

## 2012-08-17 ENCOUNTER — Ambulatory Visit (HOSPITAL_COMMUNITY)
Admission: RE | Admit: 2012-08-17 | Discharge: 2012-08-18 | Disposition: A | Discharge status: Home / Self Care | Payer: BC Managed Care – PPO | Source: Ambulatory Visit | Attending: General Surgery | Admitting: General Surgery

## 2012-08-17 ENCOUNTER — Ambulatory Visit (HOSPITAL_COMMUNITY): Payer: BC Managed Care – PPO | Admitting: Anesthesiology

## 2012-08-17 ENCOUNTER — Encounter (HOSPITAL_COMMUNITY): Payer: Self-pay | Admitting: *Deleted

## 2012-08-17 ENCOUNTER — Encounter (HOSPITAL_COMMUNITY)
Admission: RE | Disposition: A | Discharge status: Home / Self Care | Payer: Self-pay | Source: Ambulatory Visit | Attending: General Surgery

## 2012-08-17 DIAGNOSIS — Z79899 Other long term (current) drug therapy: Secondary | ICD-10-CM | POA: Insufficient documentation

## 2012-08-17 DIAGNOSIS — E063 Autoimmune thyroiditis: Secondary | ICD-10-CM

## 2012-08-17 DIAGNOSIS — M1990 Unspecified osteoarthritis, unspecified site: Secondary | ICD-10-CM | POA: Insufficient documentation

## 2012-08-17 DIAGNOSIS — D34 Benign neoplasm of thyroid gland: Secondary | ICD-10-CM

## 2012-08-17 DIAGNOSIS — K219 Gastro-esophageal reflux disease without esophagitis: Secondary | ICD-10-CM | POA: Insufficient documentation

## 2012-08-17 DIAGNOSIS — D440 Neoplasm of uncertain behavior of thyroid gland: Secondary | ICD-10-CM

## 2012-08-17 HISTORY — PX: THYROID LOBECTOMY: SHX420

## 2012-08-17 SURGERY — LOBECTOMY, THYROID
Anesthesia: General | Site: Neck | Laterality: Left | Wound class: Clean

## 2012-08-17 MED ORDER — MENTHOL 3 MG MT LOZG: 1.0000 | LOZENGE | OROMUCOSAL | Status: DC | PRN

## 2012-08-17 MED ORDER — CHLORHEXIDINE GLUCONATE 4 % EX LIQD
1.0000 "application " | Freq: Once | CUTANEOUS | Status: DC
  Filled 2012-08-17: qty 15

## 2012-08-17 MED ORDER — KCL IN DEXTROSE-NACL 20-5-0.45 MEQ/L-%-% IV SOLN
INTRAVENOUS | Status: DC
  Administered 2012-08-17: 15:00:00 via INTRAVENOUS
  Filled 2012-08-17 (×3): qty 1000

## 2012-08-17 MED ORDER — PROPOFOL 10 MG/ML IV BOLUS
INTRAVENOUS | Status: DC | PRN
  Administered 2012-08-17: 200 mg via INTRAVENOUS

## 2012-08-17 MED ORDER — NEOSTIGMINE METHYLSULFATE 1 MG/ML IJ SOLN
INTRAMUSCULAR | Status: DC | PRN
  Administered 2012-08-17: 3 mg via INTRAVENOUS

## 2012-08-17 MED ORDER — CALCIUM CITRATE 950 (200 CA) MG PO TABS
1.0000 | ORAL_TABLET | Freq: Three times a day (TID) | ORAL | Status: DC
  Administered 2012-08-17 – 2012-08-18 (×3): 200 mg via ORAL
  Filled 2012-08-17 (×5): qty 1

## 2012-08-17 MED ORDER — LACTATED RINGERS IV SOLN
INTRAVENOUS | Status: DC
  Administered 2012-08-17: 1000 mL via INTRAVENOUS

## 2012-08-17 MED ORDER — FAMOTIDINE 10 MG PO TABS
10.0000 mg | ORAL_TABLET | Freq: Every day | ORAL | Status: DC
  Administered 2012-08-17: 10 mg via ORAL
  Filled 2012-08-17 (×2): qty 1

## 2012-08-17 MED ORDER — HYDROMORPHONE HCL PF 1 MG/ML IJ SOLN
INTRAMUSCULAR | Status: DC | PRN
  Administered 2012-08-17: 1 mg via INTRAVENOUS

## 2012-08-17 MED ORDER — 0.9 % SODIUM CHLORIDE (POUR BTL) OPTIME
TOPICAL | Status: DC | PRN
  Administered 2012-08-17: 1000 mL

## 2012-08-17 MED ORDER — ONDANSETRON HCL 4 MG/2ML IJ SOLN
INTRAMUSCULAR | Status: DC | PRN
  Administered 2012-08-17: 4 mg via INTRAVENOUS

## 2012-08-17 MED ORDER — MIRTAZAPINE 7.5 MG PO TABS
3.7500 mg | ORAL_TABLET | Freq: Every day | ORAL | Status: DC
  Administered 2012-08-17: 3.75 mg via ORAL
  Filled 2012-08-17 (×2): qty 1

## 2012-08-17 MED ORDER — ACETAMINOPHEN 10 MG/ML IV SOLN
INTRAVENOUS | Status: AC
  Filled 2012-08-17: qty 100

## 2012-08-17 MED ORDER — KETAMINE HCL 10 MG/ML IJ SOLN
INTRAMUSCULAR | Status: DC | PRN
  Administered 2012-08-17 (×3): 1 mg via INTRAVENOUS
  Administered 2012-08-17: 25 mg via INTRAVENOUS
  Administered 2012-08-17 (×2): 1 mg via INTRAVENOUS

## 2012-08-17 MED ORDER — HYDROMORPHONE HCL PF 1 MG/ML IJ SOLN: 0.2500 mg | INTRAMUSCULAR | Status: DC | PRN

## 2012-08-17 MED ORDER — VITAMIN D3 25 MCG (1000 UNIT) PO TABS
1000.0000 [IU] | ORAL_TABLET | Freq: Every day | ORAL | Status: DC
  Administered 2012-08-17 – 2012-08-18 (×2): 1000 [IU] via ORAL
  Filled 2012-08-17 (×2): qty 1

## 2012-08-17 MED ORDER — ONDANSETRON HCL 4 MG/2ML IJ SOLN
4.0000 mg | Freq: Four times a day (QID) | INTRAMUSCULAR | Status: DC | PRN
  Administered 2012-08-17: 4 mg via INTRAVENOUS
  Filled 2012-08-17: qty 2

## 2012-08-17 MED ORDER — ALPRAZOLAM 0.5 MG PO TABS
0.5000 mg | ORAL_TABLET | Freq: Every day | ORAL | Status: DC
  Administered 2012-08-17: 0.5 mg via ORAL
  Filled 2012-08-17: qty 1

## 2012-08-17 MED ORDER — ONDANSETRON HCL 4 MG PO TABS: 4.0000 mg | ORAL_TABLET | Freq: Four times a day (QID) | ORAL | Status: DC | PRN

## 2012-08-17 MED ORDER — PROMETHAZINE HCL 25 MG/ML IJ SOLN: 6.2500 mg | INTRAMUSCULAR | Status: DC | PRN

## 2012-08-17 MED ORDER — OCUVITE PO TABS
1.0000 | ORAL_TABLET | Freq: Every day | ORAL | Status: DC
  Administered 2012-08-17 – 2012-08-18 (×2): 1 via ORAL
  Filled 2012-08-17 (×2): qty 1

## 2012-08-17 MED ORDER — ACETAMINOPHEN 10 MG/ML IV SOLN
INTRAVENOUS | Status: DC | PRN
  Administered 2012-08-17: 1000 mg via INTRAVENOUS

## 2012-08-17 MED ORDER — BUPIVACAINE-EPINEPHRINE PF 0.25-1:200000 % IJ SOLN
INTRAMUSCULAR | Status: DC | PRN
  Administered 2012-08-17: 30 mL

## 2012-08-17 MED ORDER — METOCLOPRAMIDE HCL 5 MG/ML IJ SOLN
INTRAMUSCULAR | Status: DC | PRN
  Administered 2012-08-17: 10 mg via INTRAVENOUS

## 2012-08-17 MED ORDER — MIDAZOLAM HCL 5 MG/5ML IJ SOLN
INTRAMUSCULAR | Status: DC | PRN
  Administered 2012-08-17: 2 mg via INTRAVENOUS

## 2012-08-17 MED ORDER — CIPROFLOXACIN IN D5W 400 MG/200ML IV SOLN
INTRAVENOUS | Status: AC
  Filled 2012-08-17: qty 200

## 2012-08-17 MED ORDER — OXYCODONE-ACETAMINOPHEN 5-325 MG PO TABS: 1.0000 | ORAL_TABLET | ORAL | Status: DC | PRN

## 2012-08-17 MED ORDER — BUPIVACAINE-EPINEPHRINE PF 0.25-1:200000 % IJ SOLN
INTRAMUSCULAR | Status: AC
  Filled 2012-08-17: qty 30

## 2012-08-17 MED ORDER — DEXAMETHASONE SODIUM PHOSPHATE 4 MG/ML IJ SOLN
INTRAMUSCULAR | Status: DC | PRN
  Administered 2012-08-17: 10 mg via INTRAVENOUS

## 2012-08-17 MED ORDER — GLYCOPYRROLATE 0.2 MG/ML IJ SOLN
INTRAMUSCULAR | Status: DC | PRN
  Administered 2012-08-17: 0.4 mg via INTRAVENOUS

## 2012-08-17 MED ORDER — FENTANYL CITRATE 0.05 MG/ML IJ SOLN
INTRAMUSCULAR | Status: DC | PRN
  Administered 2012-08-17 (×2): 50 ug via INTRAVENOUS

## 2012-08-17 MED ORDER — MORPHINE SULFATE 2 MG/ML IJ SOLN: 1.0000 mg | INTRAMUSCULAR | Status: DC | PRN

## 2012-08-17 MED ORDER — CIPROFLOXACIN IN D5W 400 MG/200ML IV SOLN
400.0000 mg | INTRAVENOUS | Status: AC
  Administered 2012-08-17: 400 mg via INTRAVENOUS

## 2012-08-17 MED ORDER — ZOLPIDEM TARTRATE 5 MG PO TABS
5.0000 mg | ORAL_TABLET | Freq: Every evening | ORAL | Status: DC | PRN
  Administered 2012-08-17: 5 mg via ORAL
  Filled 2012-08-17: qty 1

## 2012-08-17 MED ORDER — PHENOL 1.4 % MT LIQD
1.0000 | OROMUCOSAL | Status: DC | PRN
  Filled 2012-08-17: qty 177

## 2012-08-17 MED ORDER — ROCURONIUM BROMIDE 100 MG/10ML IV SOLN
INTRAVENOUS | Status: DC | PRN
  Administered 2012-08-17: 50 mg via INTRAVENOUS

## 2012-08-17 SURGICAL SUPPLY — 49 items
APL SKNCLS STERI-STRIP NONHPOA (GAUZE/BANDAGES/DRESSINGS) ×1
ATTRACTOMAT 16X20 MAGNETIC DRP (DRAPES) ×2 IMPLANT
BENZOIN TINCTURE PRP APPL 2/3 (GAUZE/BANDAGES/DRESSINGS) ×2 IMPLANT
BLADE HEX COATED 2.75 (ELECTRODE) ×1 IMPLANT
BLADE SURG 15 STRL LF DISP TIS (BLADE) ×1 IMPLANT
BLADE SURG 15 STRL SS (BLADE) ×2
CANISTER SUCTION 2500CC (MISCELLANEOUS) ×2 IMPLANT
CHLORAPREP W/TINT 10.5 ML (MISCELLANEOUS) ×2 IMPLANT
CLIP TI MEDIUM 6 (CLIP) ×4 IMPLANT
CLIP TI WIDE RED SMALL 6 (CLIP) ×5 IMPLANT
CLOTH BEACON ORANGE TIMEOUT ST (SAFETY) ×2 IMPLANT
CLSR STERI-STRIP ANTIMIC 1/2X4 (GAUZE/BANDAGES/DRESSINGS) ×1 IMPLANT
DISSECTOR ROUND CHERRY 3/8 STR (MISCELLANEOUS) ×2 IMPLANT
DRAPE PED LAPAROTOMY (DRAPES) ×2 IMPLANT
DRAPE UTILITY XL STRL (DRAPES) ×3 IMPLANT
DRESSING SURGICEL FIBRLLR 1X2 (HEMOSTASIS) ×1 IMPLANT
DRSG SURGICEL FIBRILLAR 1X2 (HEMOSTASIS) ×2
DRSG TEGADERM 4X4.75 (GAUZE/BANDAGES/DRESSINGS) ×1 IMPLANT
ELECT COATED BLADE 2.86 ST (ELECTRODE) ×2 IMPLANT
ELECT REM PT RETURN 9FT ADLT (ELECTROSURGICAL) ×2
ELECTRODE REM PT RTRN 9FT ADLT (ELECTROSURGICAL) ×1 IMPLANT
GAUZE SPONGE 4X4 16PLY XRAY LF (GAUZE/BANDAGES/DRESSINGS) ×3 IMPLANT
GLOVE BIOGEL M STRL SZ7.5 (GLOVE) ×2 IMPLANT
GLOVE BIOGEL PI IND STRL 7.0 (GLOVE) ×1 IMPLANT
GLOVE BIOGEL PI INDICATOR 7.0 (GLOVE)
GLOVE INDICATOR 8.0 STRL GRN (GLOVE) ×2 IMPLANT
GLOVE SURG SIGNA 7.5 PF LTX (GLOVE) ×1 IMPLANT
GOWN STRL NON-REIN LRG LVL3 (GOWN DISPOSABLE) ×2 IMPLANT
GOWN STRL REIN XL XLG (GOWN DISPOSABLE) ×4 IMPLANT
KIT BASIN OR (CUSTOM PROCEDURE TRAY) ×2 IMPLANT
NEEDLE HYPO 22GX1.5 SAFETY (NEEDLE) ×2 IMPLANT
NS IRRIG 1000ML POUR BTL (IV SOLUTION) ×2 IMPLANT
PACK BASIC VI WITH GOWN DISP (CUSTOM PROCEDURE TRAY) ×2 IMPLANT
PEN SKIN MARKING BROAD (MISCELLANEOUS) ×2 IMPLANT
PENCIL BUTTON HOLSTER BLD 10FT (ELECTRODE) ×2 IMPLANT
SHEARS HARMONIC 9CM CVD (BLADE) ×2 IMPLANT
SPONGE GAUZE 4X4 12PLY (GAUZE/BANDAGES/DRESSINGS) ×2 IMPLANT
STAPLER VISISTAT 35W (STAPLE) ×1 IMPLANT
STRIP CLOSURE SKIN 1/2X4 (GAUZE/BANDAGES/DRESSINGS) ×2 IMPLANT
SUT MNCRL AB 4-0 PS2 18 (SUTURE) ×2 IMPLANT
SUT SILK 2 0 (SUTURE) ×2
SUT SILK 2-0 18XBRD TIE 12 (SUTURE) ×1 IMPLANT
SUT SILK 3 0 (SUTURE)
SUT SILK 3-0 18XBRD TIE 12 (SUTURE) IMPLANT
SUT VIC AB 3-0 SH 18 (SUTURE) ×3 IMPLANT
SYR BULB IRRIGATION 50ML (SYRINGE) ×2 IMPLANT
SYR CONTROL 10ML LL (SYRINGE) ×2 IMPLANT
TOWEL OR 17X26 10 PK STRL BLUE (TOWEL DISPOSABLE) ×2 IMPLANT
YANKAUER SUCT BULB TIP 10FT TU (MISCELLANEOUS) ×2 IMPLANT

## 2012-08-17 NOTE — Anesthesia Postprocedure Evaluation (Signed)
  Anesthesia Post-op Note  Patient: Kimberly Ryan  Procedure(s) Performed: Procedure(s) (LRB): THYROID LOBECTOMY (Left)  Patient Location: PACU  Anesthesia Type: General  Level of Consciousness: awake and alert   Airway and Oxygen Therapy: Patient Spontanous Breathing  Post-op Pain: mild  Post-op Assessment: Post-op Vital signs reviewed, Patient's Cardiovascular Status Stable, Respiratory Function Stable, Patent Airway and No signs of Nausea or vomiting  Post-op Vital Signs: stable  Complications: No apparent anesthesia complications

## 2012-08-17 NOTE — Preoperative (Signed)
Beta Blockers   Reason not to administer Beta Blockers:Not Applicable, not on home BB 

## 2012-08-17 NOTE — Transfer of Care (Signed)
Immediate Anesthesia Transfer of Care Note  Patient: Kimberly Ryan  Procedure(s) Performed: Procedure(s) (LRB) with comments: THYROID LOBECTOMY (Left) - Left Thyroid Lobectomy  Patient Location: PACU  Anesthesia Type: General  Level of Consciousness: awake, follows commands  Airway & Oxygen Therapy: Patient Spontanous Breathing and Patient connected to face mask oxygen  Post-op Assessment: Report given to PACU RN, Post -op Vital signs reviewed and stable and Patient moving all extremities  Post vital signs: Reviewed and stable  Complications: No apparent anesthesia complications

## 2012-08-17 NOTE — H&P (Signed)
Kimberly Ryan is an 61 y.o. female.   Chief Complaint: here for surgery HPI: 61 year old Caucasian female comes in for followup after undergoing a repeat thyroid ultrasound in July. I initially met her in January 2013 after she had a thyroid ultrasound which revealed bilateral thyroid nodules. She had one nodule on each side that was a little bit over 1 cm in size. She had no lymphadenopathy. There is no other concerning ultrasound features. She also had normal thyroid function at that time. We decided that we would perform a six-month followup ultrasound to monitor the nodules. The left nodule increased slightly in size now 1.6 cm. She underwent an ultrasound-guided FNA biopsy which revealed a follicular lesion with hyperplastic cells. She is here today to discuss the results. She denies any new medical problems. She denies any sore throat, voice change, lymphadenopathy, fever, chills, difficulty swallowing, depression, constipation, sweats, or palpitations.   Past Medical History  Diagnosis Date  . Arthritis   . GERD (gastroesophageal reflux disease)   . Thyroid disease   . Anxiety   . Shortness of breath     with exertion   . Headache     Past Surgical History  Procedure Date  . Rhinoplasty 1953  . Tonsillectomy 1954  . Myomectomy 2000    Family History  Problem Relation Age of Onset  . Heart disease Father    Social History:  reports that she quit smoking about 26 years ago. She has never used smokeless tobacco. She reports that she drinks alcohol. She reports that she does not use illicit drugs.  Allergies:  Allergies  Allergen Reactions  . Cephalexin Itching, Rash and Other (See Comments)    Eyes were tearing up    Medications Prior to Admission  Medication Sig Dispense Refill  . ALPRAZolam (XANAX) 0.5 MG tablet Take 0.5 mg by mouth at bedtime. Once at bed time      . b complex vitamins tablet Take 1 tablet by mouth 3 (three) times a week.       . beta carotene  w/minerals (OCUVITE) tablet Take 1 tablet by mouth daily.      . calcium carbonate (OS-CAL) 600 MG TABS Take 200 mg by mouth 3 (three) times daily.       . calcium citrate (CALCITRATE - DOSED IN MG ELEMENTAL CALCIUM) 950 MG tablet Take 1 tablet by mouth 3 (three) times daily.      . cholecalciferol (VITAMIN D) 1000 UNITS tablet Take 1,000 Units by mouth daily.      Marland Kitchen desonide (DESOWEN) 0.05 % ointment Apply 1 application topically 2 (two) times daily. Apply to vulva region      . diphenhydrAMINE (BENADRYL) 25 mg capsule Take 25 mg by mouth at bedtime as needed. For sleep      . Docosahexaenoic Acid (DHA OMEGA 3 PO) Take 1 capsule by mouth.       Marland Kitchen ibuprofen (ADVIL,MOTRIN) 400 MG tablet Take 400 mg by mouth every 6 (six) hours as needed. For pain      . mirtazapine (REMERON) 7.5 MG tablet Take 3.75 mg by mouth at bedtime. Pt takes 1/4 of 15 mg pill at bedtime      . Multiple Vitamin (MULTIVITAMIN) tablet Take 0.5 tablets by mouth daily.       Marland Kitchen OVER THE COUNTER MEDICATION Take 1 tablet by mouth 3 (three) times a week. Ultra jarro-dophilus      . OVER THE COUNTER MEDICATION Take 1 tablet by mouth 4 (  four) times daily. Immpower immune booster - mushroom pt takes only as needed      . ranitidine (ZANTAC) 150 MG tablet Take 150 mg by mouth at bedtime as needed. For acid reflux at bedtime      . vitamin C (ASCORBIC ACID) 500 MG tablet Take 500 mg by mouth daily as needed.       . zolpidem (AMBIEN) 10 MG tablet Take 5 mg by mouth at bedtime as needed. For sleep        No results found for this or any previous visit (from the past 48 hour(s)). No results found.  Review of Systems  Constitutional: Negative for fever and chills.  HENT: Negative for sore throat.   Respiratory: Negative for shortness of breath, wheezing and stridor.   Cardiovascular: Negative for chest pain and leg swelling.  All other systems reviewed and are negative.    Blood pressure 128/79, pulse 83, temperature 97.7 F (36.5  C), resp. rate 20, SpO2 96.00%. Physical Exam  Vitals reviewed. Constitutional: She is oriented to person, place, and time. She appears well-developed and well-nourished. No distress.  HENT:  Head: Normocephalic and atraumatic.  Right Ear: External ear normal.  Left Ear: External ear normal.  Eyes: Conjunctivae are normal. No scleral icterus.  Neck: Normal range of motion. Neck supple. No tracheal deviation present. No thyromegaly present.  Cardiovascular: Normal rate.   Respiratory: Effort normal. No stridor.  GI: Soft. She exhibits no distension.  Musculoskeletal: She exhibits no edema and no tenderness.  Lymphadenopathy:    She has no cervical adenopathy.  Neurological: She is alert and oriented to person, place, and time. She exhibits normal muscle tone.  Skin: Skin is warm and dry. No rash noted. She is not diaphoretic. No erythema. No pallor.  Psychiatric: She has a normal mood and affect. Her behavior is normal. Judgment and thought content normal.     Assessment/Plan Left thyroid nodule - FNA c/w follicular lesion with hyperplastic cells  To OR for left thyroid lobectomy All questions asked and answered.  Discussed rare possibility of total thyroidectomy  [name]. Andrey Campanile, MD, FACS General, Bariatric, & Minimally Invasive Surgery [facility] Surgery, [place]   [facility] M 08/17/2012, 9:21 AM

## 2012-08-17 NOTE — Brief Op Note (Signed)
08/17/2012  11:16 AM  PATIENT:  Kimberly Ryan  61 y.o. female  PRE-OPERATIVE DIAGNOSIS:  LEFT THYROID NODULE (Follicular lesion with hyperplastic cells on FNA)  POST-OPERATIVE DIAGNOSIS:  same  PROCEDURE:  Procedure(s) (LRB) with comments: THYROID LOBECTOMY (Left) - Left Thyroid Lobectomy  SURGEON:  Surgeon(s) and Role:    * Atilano Ina, MD,FACS - Primary    * Velora Heckler, MD - Assisting  PHYSICIAN ASSISTANT: none  ASSISTANTS: see above   ANESTHESIA:   general  EBL:  Total I/O In: 600 [I.V.:600] Out: -   BLOOD ADMINISTERED:none  DRAINS: none   LOCAL MEDICATIONS USED:  MARCAINE     SPECIMEN:  Source of Specimen:  left thyroid lobe - stitch superior pole  DISPOSITION OF SPECIMEN:  PATHOLOGY  COUNTS:  YES  TOURNIQUET:  * No tourniquets in log *  DICTATION: .Other Dictation: Dictation Number [id number]  PLAN OF CARE: Admit for overnight observation  PATIENT DISPOSITION:  PACU - hemodynamically stable.   Delay start of Pharmacological VTE agent (>24hrs) due to surgical blood loss or risk of bleeding: no [name]. Andrey Campanile, MD, FACS General, Bariatric, & Minimally Invasive Surgery [facility], [place]

## 2012-08-17 NOTE — Anesthesia Preprocedure Evaluation (Signed)
Anesthesia Evaluation  Patient identified by MRN, date of birth, ID band Patient awake    Reviewed: Allergy & Precautions, H&P , NPO status , Patient's Chart, lab work & pertinent test results  Airway Mallampati: II TM Distance: <3 FB Neck ROM: Full    Dental No notable dental hx.    Pulmonary neg pulmonary ROS,  breath sounds clear to auscultation  Pulmonary exam normal       Cardiovascular negative cardio ROS  Rhythm:Regular Rate:Normal     Neuro/Psych negative neurological ROS  negative psych ROS   GI/Hepatic Neg liver ROS, GERD-  Medicated,  Endo/Other  Morbid obesity  Renal/GU negative Renal ROS  negative genitourinary   Musculoskeletal negative musculoskeletal ROS (+)   Abdominal   Peds negative pediatric ROS (+)  Hematology negative hematology ROS (+)   Anesthesia Other Findings   Reproductive/Obstetrics negative OB ROS                           Anesthesia Physical Anesthesia Plan  ASA: II  Anesthesia Plan: General   Post-op Pain Management:    Induction: Intravenous  Airway Management Planned: Oral ETT  Additional Equipment:   Intra-op Plan:   Post-operative Plan: Extubation in OR  Informed Consent: I have reviewed the patients History and Physical, chart, labs and discussed the procedure including the risks, benefits and alternatives for the proposed anesthesia with the patient or authorized representative who has indicated his/her understanding and acceptance.   Dental advisory given  Plan Discussed with: CRNA and Surgeon  Anesthesia Plan Comments:         Anesthesia Quick Evaluation

## 2012-08-18 ENCOUNTER — Telehealth (INDEPENDENT_AMBULATORY_CARE_PROVIDER_SITE_OTHER): Payer: Self-pay | Admitting: General Surgery

## 2012-08-18 ENCOUNTER — Encounter (HOSPITAL_COMMUNITY): Payer: Self-pay | Admitting: General Surgery

## 2012-08-18 MED ORDER — OXYCODONE-ACETAMINOPHEN 5-325 MG PO TABS: 1.0000 | ORAL_TABLET | ORAL | Status: AC | PRN

## 2012-08-18 NOTE — Op Note (Signed)
NAMEMARIONETTE, MESKILL         ACCOUNT NO.:  [id number]  MEDICAL RECORD NO.:  [id number]  LOCATION:  1526                         FACILITY:  [facility]  PHYSICIAN:  [name]. Andrey Campanile, MD, FACSDATE OF BIRTH:  1951-08-07  DATE OF PROCEDURE:  08/17/2012 DATE OF DISCHARGE:                              OPERATIVE REPORT   PREOPERATIVE DIAGNOSIS:  Left thyroid nodule (follicular lesion with hyperplastic cells on fine-needle aspiration).  POSTOPERATIVE DIAGNOSIS:  Left thyroid nodule (follicular lesion with hyperplastic cells on fine-needle aspiration).  PROCEDURE:  Left thyroid lobectomy.  SURGEON:  [name]. Andrey Campanile, MD, FACS.  ASSISTANT SURGEON:  Velora Heckler, MD FACS  ANESTHESIA:  General plus local consisting of 4% Marcaine with epi.  ESTIMATED BLOOD LOSS:  Minimal.  SPECIMEN:  Left thyroid lobe, stitch marks superior pole.  DRAINS:  None.  INDICATIONS FOR PROCEDURE:  The patient is a very pleasant 61 year old female, who initially complained of a right posterior lateral neck nodule.  An ultrasound was done of her neck, which revealed bilateral thyroid nodules, one on the left, was slightly larger than 1 cm. Followup ultrasound in early July revealed that this lesion had an interval increase in size.  She underwent ultrasound-guided FNA biopsy of the left thyroid nodule, which demonstrated follicular lesion with hyperplastic cells.  Based on the finding of hyperplastic cells, we discussed pros and cons of continued observation versus surgical left thyroid lobectomy.  We discussed the risks and benefits of surgery in detail.  The patient also had a second opinion with an endocrinologist, who also concurred with a left thyroid lobectomy.  The patient elected to proceed to the operating room for left thyroid lobectomy.  DESCRIPTION OF PROCEDURE:  After obtaining informed consent and marking the patient's left side of the neck, she was taken to operating room 6 at [facility].  She was placed in supine on the operating table.  General endotracheal anesthesia was established. Both arms were tucked by her side.  She had appropriate padding.  Her neck was slightly hyperextended.  She also had a roll placed between her shoulder blades.  Her neck and upper chest were prepped and draped with ChloraPrep.  She received ciprofloxacin prior to skin incision.  Surgical time-out was performed. The surgical incision was drawn with a marking pen about 1 fingerbreadth below above the sternal notch and making a total length of 5 cm skin incision, extending more towards the left with a 15 blade.  The subcutaneous tissue was divided with electrocautery.  The platysma was divided with electrocautery.  We then raised superior and inferior flaps.  A Weitlaner retractor was placed.  The median raphae was divided with electrocautery.  We then lifted up the strap muscles off the left thyroid lobe.  It was palpated.  There was no suspicious findings.  The jugular chain on gross palpation felt normal as well as  the right lobe of the thyroid.  An [employer] retractor was placed underneath the strap muscles and lifted up.  I then dissected started dissecting around the superior pole of the thyroid hugging and staying very close to the thyroid gland.  The superior pole was taken down in sequential manner with a combination of small  clips, the medium clips as well as the Harmonic scalpel.  I then took down the middle thyroid vein with clips and Harmonic scalpel.  I then turned my attention down to the inferior pole and isolated it from surrounding tissues again staying very close to the thyroid lobe and again isolated that taking it down with a combination of clips and Harmonic scalpel.  I had identified the superior parathyroid gland, was able to preserve it and it was dissected out away from the thyroid lobe.  We were then able to start medializing the thyroid lobe towards the  midline.  I continued gentle tedious dissection with the aid of the right angle staying right on the thyroid capsule.  The recurrent laryngeal nerve was identified and we stayed well above and away from it.  Then using the Bovie electrocautery, I lifted up the lobe from the trachea and was able to get it past the midline and lifted the isthmus up off the thyroid off the trachea as well.  We completely mobilized the left lobe of the thyroid as well as the isthmus.  I then divided the isthmus with the Harmonic scalpel thus freeing the thyroid gland.  A marking stitch was used to mark the superior pole.  A 4x4 was placed into the left neck for few minutes.  We then removed it and irrigated the left neck with saline.  There was no evidence of bleeding.  Several pieces of Fibrillar were placed into the left neck.  I then reapproximated the muscles in the midline with interrupted figure-of-eight 3-0 Vicryl sutures.  I then infiltrated local in the subcutaneous tissue.  The platysma was then reapproximated with inverted interrupted 3-0 Vicryl sutures.  The skin was reapproximated with 4-0 Monocryl in a subcuticular fashion.  I then used benzoin Steri-Strips, 4 x 4, and a Tegaderm as a dressing.  The patient was extubated and taken to the recovery room in stable condition.  All needle, instrument, sponge counts were correct x2.  There were no immediate complications.  The patient tolerated the procedure well.     [name]. Andrey Campanile, MD, FACS     EMW/MEDQ  D:  08/17/2012  T:  08/18/2012  Job:  [id number]

## 2012-08-18 NOTE — Discharge Summary (Signed)
Physician Discharge Summary  Patient ID: Kimberly Ryan MRN: [id number] DOB/AGE: Aug 14, 1951 61 y.o.  Admit date: 08/17/2012 Discharge date: 08/18/2012  Admission Diagnoses: Left follicular thyroid nodule with hyperplastic cells OA GERD  Discharge Diagnoses:  Same S/p Left thyroid lobectomy  Discharged Condition: good  Hospital Course: 61 yo WF admitted for plan Left thyroid lobectomy for FNA bx consistent with follicular lesion with hyperplastic cells. Her postoperative course was unremarkable. She was kept overnight for observation. On POD 1, she was tolerating a regular diet, vitals were stable, pain controlled, ambulating without difficulty. She had no complaints regarding her voice. She was deemed stable for discharge  Consults: None  Significant Diagnostic Studies: none  Treatments: IV hydration, analgesia: acetaminophen, Morphine and percocet and surgery: LEFT THYROID LOBECTOMY 08/17/12  Discharge Exam: Blood pressure 111/60, pulse 76, temperature 98.6 F (37 C), temperature source Oral, resp. rate 18, height 5\' 7"  (1.702 m), weight 225 lb 6.4 oz (102.241 kg), SpO2 94.00%. Alert, nad, smiling, eating cta Reg Neck - dressing c/d/i. No hematoma  Disposition:   Discharge Orders    Future Appointments: Provider: Department: Dept Phone: Center:   09/01/2012 9:30 AM Atilano Ina, MD,FACS [facility] [phone] None     Future Orders Please Complete By Expires   Diet - low sodium heart healthy      Increase activity slowly        Medication List  As of 08/18/2012  7:52 AM   TAKE these medications         ALPRAZolam 0.5 MG tablet   Commonly known as: XANAX   Take 0.5 mg by mouth at bedtime. Once at bed time      b complex vitamins tablet   Take 1 tablet by mouth 3 (three) times a week.      beta carotene w/minerals tablet   Take 1 tablet by mouth daily.      calcium carbonate 600 MG Tabs   Commonly known as: OS-CAL   Take 200 mg by mouth 3 (three) times  daily.      calcium citrate 950 MG tablet   Commonly known as: CALCITRATE - dosed in mg elemental calcium   Take 1 tablet by mouth 3 (three) times daily.      cholecalciferol 1000 UNITS tablet   Commonly known as: VITAMIN D   Take 1,000 Units by mouth daily.      desonide 0.05 % ointment   Commonly known as: DESOWEN   Apply 1 application topically 2 (two) times daily. Apply to vulva region      DHA OMEGA 3 PO   Take 1 capsule by mouth.      diphenhydrAMINE 25 mg capsule   Commonly known as: BENADRYL   Take 25 mg by mouth at bedtime as needed. For sleep      ibuprofen 400 MG tablet   Commonly known as: ADVIL,MOTRIN   Take 400 mg by mouth every 6 (six) hours as needed. For pain      mirtazapine 7.5 MG tablet   Commonly known as: REMERON   Take 3.75 mg by mouth at bedtime. Pt takes 1/4 of 15 mg pill at bedtime      multivitamin tablet   Take 0.5 tablets by mouth daily.      OVER THE COUNTER MEDICATION   Take 1 tablet by mouth 3 (three) times a week. Ultra jarro-dophilus      OVER THE COUNTER MEDICATION   Take 1 tablet by mouth 4 (four)  times daily. Immpower immune booster - mushroom pt takes only as needed      oxyCODONE-acetaminophen 5-325 MG per tablet   Commonly known as: PERCOCET/ROXICET   Take 1-2 tablets by mouth every 4 (four) hours as needed.      ranitidine 150 MG tablet   Commonly known as: ZANTAC   Take 150 mg by mouth at bedtime as needed. For acid reflux at bedtime      vitamin C 500 MG tablet   Commonly known as: ASCORBIC ACID   Take 500 mg by mouth daily as needed.      zolpidem 10 MG tablet   Commonly known as: AMBIEN   Take 5 mg by mouth at bedtime as needed. For sleep           Follow-up Information    Follow up with Atilano Ina, MD,FACS on 09/01/2012. (9:30 AM)    Contact information:   [address] [place] [address] [phone]          Signed: Atilano Ina 08/18/2012, 7:52 AM  [name]. Andrey Campanile, MD,  FACS General, Bariatric, & Minimally Invasive Surgery [facility] Surgery, [place]

## 2012-08-18 NOTE — Telephone Encounter (Signed)
Attempted to reach pt regarding surgical path. No answer. Path - benign. No cancer. Will ask nurse to contact pt.

## 2012-08-19 ENCOUNTER — Telehealth (INDEPENDENT_AMBULATORY_CARE_PROVIDER_SITE_OTHER): Payer: Self-pay | Admitting: General Surgery

## 2012-08-19 NOTE — Telephone Encounter (Signed)
Message copied by Liliana Cline on Thu Aug 19, 2012  8:48 AM ------      Message from: Andrey Campanile, ERIC M      Created: Wed Aug 18, 2012  3:41 PM       pls call pt with path report - benign - no cancer. i couldn't reach her

## 2012-08-19 NOTE — Telephone Encounter (Signed)
Patient made aware of path results. Will follow up at appt and call with any questions prior.  

## 2012-09-01 ENCOUNTER — Ambulatory Visit (INDEPENDENT_AMBULATORY_CARE_PROVIDER_SITE_OTHER): Payer: BC Managed Care – PPO | Admitting: General Surgery

## 2012-09-01 ENCOUNTER — Encounter (INDEPENDENT_AMBULATORY_CARE_PROVIDER_SITE_OTHER): Payer: Self-pay | Admitting: General Surgery

## 2012-09-01 VITALS — BP 124/74 | HR 84 | Temp 97.7°F | Resp 16 | Ht 66.5 in | Wt 222.0 lb

## 2012-09-01 DIAGNOSIS — E041 Nontoxic single thyroid nodule: Secondary | ICD-10-CM

## 2012-09-01 DIAGNOSIS — Z9889 Other specified postprocedural states: Secondary | ICD-10-CM

## 2012-09-01 DIAGNOSIS — Z09 Encounter for follow-up examination after completed treatment for conditions other than malignant neoplasm: Secondary | ICD-10-CM

## 2012-09-01 NOTE — Patient Instructions (Signed)
Get your TSH level checked

## 2012-09-01 NOTE — Addendum Note (Signed)
Addended byLiliana Cline on: 09/01/2012 11:27 AM   Modules accepted: Orders

## 2012-09-01 NOTE — Progress Notes (Signed)
Subjective:     Patient ID: Kimberly Ryan, female   DOB: 02/16/1951, 61 y.o.   MRN: [id number]  HPI 61 year old Caucasian female comes in for postoperative check after undergoing a left thyroid lobectomy for follicular lesion with hyperplastic cells based on an FNA biopsy. Her surgery was performed on September 3. She was discharged home the following day. This is her first followup appointment. She states that she has done well. She denies any fever or chills. She denies any difficulty swallowing. She reports a good appetite. She states that her energy level is almost back to normal. She states that she has not been able to hit high notes. She denies any hoarseness in her voice.  Review of Systems     Objective:   Physical Exam BP 124/74  Pulse 84  Temp 97.7 F (36.5 C) (Temporal)  Resp 16  Ht 5' 6.5" (1.689 m)  Wt 222 lb (100.699 kg)  BMI 35.30 kg/m2 Alert, no apparent distress Pulmonary-lungs are clear Cardiac-regular rhythm Neck-well healed Kocher incision. No cellulitis, induration or fluctuance. No hematoma.    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 She was given a copy of that for her records. There is no atypia or malignancy. I explained that we do need to followup on the right thyroid nodule in 6 months with a followup ultrasound. I would also like to check her TSH level next few days to evaluate her remaining thyroid function. We discussed the change in the pitch of her voice. I explained that that was a known potential side effect of surgery. I believe it's too early to know for sure whether or not the change in the pitch of her voice is permanent. Followup 6 months after neck ultrasound  [name]. Andrey Campanile, MD, FACS General, Bariatric, & Minimally Invasive Surgery [facility], [place]

## 2012-10-01 ENCOUNTER — Telehealth (INDEPENDENT_AMBULATORY_CARE_PROVIDER_SITE_OTHER): Payer: Self-pay | Admitting: General Surgery

## 2012-10-01 NOTE — Telephone Encounter (Signed)
Message copied by Liliana Cline on Fri Oct 01, 2012  2:37 PM ------      Message from: Andrey Campanile, ERIC M      Created: Fri Oct 01, 2012 12:37 PM       pls call pt and let her know that dr ellison's office will be contacting her to arrange f/u - based on her TSH level - it looks like she may need a low dose of synthroid for replacement since we took out part of her gland

## 2012-10-01 NOTE — Telephone Encounter (Signed)
Patient made aware. She will await call from Dr George Hugh office for an appt.

## 2012-10-04 ENCOUNTER — Ambulatory Visit (INDEPENDENT_AMBULATORY_CARE_PROVIDER_SITE_OTHER): Payer: BC Managed Care – PPO | Admitting: Endocrinology

## 2012-10-04 ENCOUNTER — Encounter: Payer: Self-pay | Admitting: Endocrinology

## 2012-10-04 VITALS — BP 110/72 | HR 80 | Temp 97.7°F | Resp 16 | Wt 221.1 lb

## 2012-10-04 DIAGNOSIS — Z23 Encounter for immunization: Secondary | ICD-10-CM

## 2012-10-04 DIAGNOSIS — E042 Nontoxic multinodular goiter: Secondary | ICD-10-CM

## 2012-10-04 MED ORDER — LEVOTHYROXINE SODIUM 50 MCG PO TABS: 50.0000 ug | ORAL_TABLET | Freq: Every day | ORAL | Status: DC

## 2012-10-04 NOTE — Patient Instructions (Addendum)
i have sent a prescription to your pharmacy, for a small amount of thyroid hormone Please redo the blood test in 1 month.   Please return in 1 year. most of the time, a "lumpy thyroid" will eventually become overactive.  this is usually a slow process, happening over the span of many years.

## 2012-10-04 NOTE — Progress Notes (Signed)
Subjective:    Patient ID: Kimberly Ryan, female    DOB: Feb 01, 1951, 61 y.o.   MRN: [id number]  HPI 7 weeks ago, pt underwent a left thyroid lobectomy, for a nodule which was suspicious on fine-needle cytology.  pt states she feels well in general. Past Medical History  Diagnosis Date  . Arthritis   . GERD (gastroesophageal reflux disease)   . Thyroid disease   . Anxiety   . Shortness of breath     with exertion   . Headache     Past Surgical History  Procedure Date  . Rhinoplasty 1953  . Tonsillectomy 1954  . Myomectomy 2000  . Thyroid lobectomy 08/17/2012    Procedure: THYROID LOBECTOMY;  Surgeon: Atilano Ina, MD,FACS;  Location: [facility];  Service: General;  Laterality: Left;  Left Thyroid Lobectomy    History   Social History  . Marital Status: Married    Spouse Name: N/A    Number of Children: N/A  . Years of Education: N/A   Occupational History  . Not on file.   Social History Main Topics  . Smoking status: Former Smoker    Quit date: 12/15/1985  . Smokeless tobacco: Never Used   Comment: quit 25 yrs ago  . Alcohol Use: Yes     3 cocktails per week   . Drug Use: No  . Sexually Active:    Other Topics Concern  . Not on file   Social History Narrative  . No narrative on file    Current Outpatient Prescriptions on File Prior to Visit  Medication Sig Dispense Refill  . ALPRAZolam (XANAX) 0.5 MG tablet Take 0.5 mg by mouth at bedtime. Once at bed time      . b complex vitamins tablet Take 1 tablet by mouth 3 (three) times a week.       . beta carotene w/minerals (OCUVITE) tablet Take 1 tablet by mouth daily.      . calcium carbonate (OS-CAL) 600 MG TABS Take 200 mg by mouth 3 (three) times daily.       . calcium citrate (CALCITRATE - DOSED IN MG ELEMENTAL CALCIUM) 950 MG tablet Take 1 tablet by mouth 3 (three) times daily.      . cholecalciferol (VITAMIN D) 1000 UNITS tablet Take 1,000 Units by mouth daily.      Marland Kitchen desonide (DESOWEN) 0.05 %  ointment Apply 1 application topically 2 (two) times daily. Apply to vulva region      . diphenhydrAMINE (BENADRYL) 25 mg capsule Take 25 mg by mouth at bedtime as needed. For sleep      . Docosahexaenoic Acid (DHA OMEGA 3 PO) Take 1 capsule by mouth.       Marland Kitchen ibuprofen (ADVIL,MOTRIN) 400 MG tablet Take 400 mg by mouth every 6 (six) hours as needed. For pain      . mirtazapine (REMERON) 7.5 MG tablet Take 3.75 mg by mouth at bedtime. Pt takes 1/4 of 15 mg pill at bedtime      . Multiple Vitamin (MULTIVITAMIN) tablet Take 0.5 tablets by mouth daily.       Marland Kitchen OVER THE COUNTER MEDICATION Take 1 tablet by mouth 3 (three) times a week. Ultra jarro-dophilus      . OVER THE COUNTER MEDICATION Take 1 tablet by mouth 4 (four) times daily. Immpower immune booster - mushroom pt takes only as needed      . ranitidine (ZANTAC) 150 MG tablet Take 150 mg by mouth at bedtime  as needed. For acid reflux at bedtime      . vitamin C (ASCORBIC ACID) 500 MG tablet Take 500 mg by mouth daily as needed.       . zolpidem (AMBIEN) 10 MG tablet Take 5 mg by mouth at bedtime as needed. For sleep        Allergies  Allergen Reactions  . Cephalexin Itching, Rash and Other (See Comments)    Eyes were tearing up    Family History  Problem Relation Age of Onset  . Heart disease Father     BP 110/72  Pulse 80  Temp 97.7 F (36.5 C) (Oral)  Resp 16  Wt 221 lb 1 oz (100.273 kg)  SpO2 95%  Review of Systems No weight change    Objective:   Physical Exam VITAL SIGNS:  See vs page GENERAL: no distress Neck: a healing scar is present.  i do not appreciate a nodule in the thyroid or elsewhere in the neck.   Lab Results  Component Value Date   TSH 4.726* 09/01/2012      Assessment & Plan:  S/p lobectomy for what proved on pathol to be a benign adenoma, with mildly elev TSH

## 2012-10-06 ENCOUNTER — Other Ambulatory Visit: Payer: Self-pay | Admitting: Endocrinology

## 2012-10-06 ENCOUNTER — Ambulatory Visit: Payer: BC Managed Care – PPO | Admitting: Endocrinology

## 2012-10-06 ENCOUNTER — Telehealth: Payer: Self-pay | Admitting: Endocrinology

## 2012-10-06 MED ORDER — LEVOTHYROXINE SODIUM 50 MCG PO TABS: 50.0000 ug | ORAL_TABLET | Freq: Every day | ORAL | Status: DC

## 2012-10-06 NOTE — Telephone Encounter (Signed)
Pt would like to know if she can have a paper script for her levothyroxine mailed to her house. She uses a [facility], but wants to fill the first bottle at a local drug store. Please call and advise.

## 2012-10-06 NOTE — Telephone Encounter (Signed)
i printed 

## 2012-10-06 NOTE — Telephone Encounter (Signed)
Mailed to pt

## 2012-11-04 ENCOUNTER — Telehealth: Payer: Self-pay | Admitting: Endocrinology

## 2012-11-04 DIAGNOSIS — E890 Postprocedural hypothyroidism: Secondary | ICD-10-CM | POA: Insufficient documentation

## 2012-11-04 NOTE — Telephone Encounter (Signed)
Pt stated she is coming in today for labs

## 2012-11-04 NOTE — Telephone Encounter (Signed)
i ordered

## 2012-11-04 NOTE — Telephone Encounter (Signed)
Pt states that during last appointment, Dr. Everardo All had her start Synthroid and told her that in about a month she would need to have blood work done. Her PCP told her that they needed orders in the system before they could draw her blood. She wonders if she should just come to our office to have blood work done? Please advise.

## 2012-12-21 ENCOUNTER — Telehealth (INDEPENDENT_AMBULATORY_CARE_PROVIDER_SITE_OTHER): Payer: Self-pay | Admitting: General Surgery

## 2012-12-21 ENCOUNTER — Ambulatory Visit
Admission: RE | Admit: 2012-12-21 | Discharge: 2012-12-21 | Disposition: A | Discharge status: Home / Self Care | Payer: BC Managed Care – PPO | Source: Ambulatory Visit | Attending: General Surgery | Admitting: General Surgery

## 2012-12-21 DIAGNOSIS — E041 Nontoxic single thyroid nodule: Secondary | ICD-10-CM

## 2012-12-21 DIAGNOSIS — Z9889 Other specified postprocedural states: Secondary | ICD-10-CM

## 2012-12-21 NOTE — Telephone Encounter (Signed)
Left message for patient to call back and ask for me. To make her aware ultrasound is stable.

## 2012-12-21 NOTE — Telephone Encounter (Signed)
Spoke with patient and made her aware ultrasound is stable. She will keep follow up with Dr Andrey Campanile for a 6 month physical exam.

## 2012-12-21 NOTE — Telephone Encounter (Signed)
Message copied by Liliana Cline on Tue Dec 21, 2012 12:56 PM ------      Message from: Andrey Campanile, ERIC M      Created: Tue Dec 21, 2012 12:10 PM       Neck u/s ok. Nodules on right are stable and same size

## 2013-01-21 ENCOUNTER — Encounter (INDEPENDENT_AMBULATORY_CARE_PROVIDER_SITE_OTHER): Payer: BC Managed Care – PPO | Admitting: General Surgery

## 2013-01-27 ENCOUNTER — Encounter (INDEPENDENT_AMBULATORY_CARE_PROVIDER_SITE_OTHER): Payer: BC Managed Care – PPO | Admitting: General Surgery

## 2013-02-04 ENCOUNTER — Telehealth: Payer: Self-pay | Admitting: Endocrinology

## 2013-02-04 MED ORDER — LEVOTHYROXINE SODIUM 50 MCG PO TABS: 50.0000 ug | ORAL_TABLET | Freq: Every day | ORAL | Status: DC

## 2013-02-04 NOTE — Telephone Encounter (Signed)
The patient called to request a 3 month rx for Levothyroxine 50 mcg mailed to her home address or printed for her to pick up.  The patient may be reached at [phone].

## 2013-02-10 ENCOUNTER — Encounter (INDEPENDENT_AMBULATORY_CARE_PROVIDER_SITE_OTHER): Payer: BC Managed Care – PPO | Admitting: General Surgery

## 2013-03-03 ENCOUNTER — Encounter (INDEPENDENT_AMBULATORY_CARE_PROVIDER_SITE_OTHER): Payer: BC Managed Care – PPO | Admitting: General Surgery

## 2013-03-07 ENCOUNTER — Other Ambulatory Visit: Payer: Self-pay | Admitting: *Deleted

## 2013-03-07 MED ORDER — LEVOTHYROXINE SODIUM 50 MCG PO TABS: 50.0000 ug | ORAL_TABLET | Freq: Every day | ORAL | Status: DC

## 2013-04-20 ENCOUNTER — Ambulatory Visit (INDEPENDENT_AMBULATORY_CARE_PROVIDER_SITE_OTHER): Payer: BC Managed Care – PPO | Admitting: General Surgery

## 2013-04-20 ENCOUNTER — Encounter (INDEPENDENT_AMBULATORY_CARE_PROVIDER_SITE_OTHER): Payer: Self-pay | Admitting: General Surgery

## 2013-04-20 VITALS — BP 108/72 | HR 75 | Temp 97.2°F | Resp 16 | Ht 66.5 in | Wt 222.6 lb

## 2013-04-20 DIAGNOSIS — E042 Nontoxic multinodular goiter: Secondary | ICD-10-CM

## 2013-04-20 NOTE — Progress Notes (Signed)
Subjective:     Patient ID: Kimberly Ryan, female   DOB: 22-Apr-1951, 62 y.o.   MRN: [id number]  HPI 62 year old Caucasian female comes in for postoperative check after undergoing a left thyroid lobectomy for follicular lesion with hyperplastic cells based on an FNA biopsy. Her surgery was performed on September 3. She was last seen in office in 08/2012. She states that she has done well. She denies any fever or chills. She denies any difficulty swallowing. She reports a good appetite. She states her voice is back to normal. She had a followup neck ultrasound in Jan 2014. She is taking her synthroid.  She denies any hoarseness in her voice. She denies any constipation, palpitations, heat/cold intolerance  PMHx, PSHx, SOCHx, FAMHx, ALL reviewed  THYROID ULTRASOUND 12/2012 Technique: Ultrasound examination of the thyroid gland and adjacent  soft tissues was performed.  Comparison: Ultrasound of the thyroid of 06/28/2012  Findings:  Right thyroid lobe: 6.2 x 1.4 x 2.1 cm. (Previously 5.9 x 1.3 x  2.0 cm).  Left thyroid lobe: The left lobe has in the interval been  resected.  Isthmus: 2.6 mm in thickness.  Focal nodules: The echogenicity of the right lobe of thyroid is  inhomogeneous. Right-sided nodules again are noted. The dominant  nodule is in the lower pole measuring 1.4 x 0.8 x 1.3 cm compared  to prior measurements of 1.4 x 0.8 x 1.3 cm. Other smaller nodules  are present of no more than 4 mm in diameter.  Lymphadenopathy: None visualized.  IMPRESSION:  1. Left thyroidectomy.  2. Stable right thyroid nodules.   Review of Systems 10 point ROS performed and negative except for above    Objective:   Physical Exam BP 108/72  Pulse 75  Temp(Src) 97.2 F (36.2 C) (Temporal)  Resp 16  Ht 5' 6.5" (1.689 m)  Wt 222 lb 9.6 oz (100.971 kg)  BMI 35.39 kg/m2  SpO2 96% Alert, no apparent distress Pulmonary-lungs are clear Cardiac-regular rhythm Neck-well healed Kocher incision.  No cellulitis, induration or fluctuance. No hematoma. 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 F/u neck u/s in 1 yrs. Follow up with Dr Everardo All in fall to monitor TSH level. F/u with me 1 yr.   [name]. Andrey Campanile, MD, FACS General, Bariatric, & Minimally Invasive Surgery [facility], [place]

## 2013-04-20 NOTE — Patient Instructions (Signed)
You will need followup neck ultrasound in 1 year Follow up with Dr Everardo All in the fall

## 2013-04-20 NOTE — Addendum Note (Signed)
Addended byLiliana Cline on: 04/20/2013 01:45 PM   Modules accepted: Orders

## 2013-07-14 ENCOUNTER — Ambulatory Visit: Payer: BC Managed Care – PPO

## 2013-07-21 ENCOUNTER — Ambulatory Visit: Payer: Self-pay | Attending: Family Medicine | Admitting: Physical Therapy

## 2013-07-21 DIAGNOSIS — M25579 Pain in unspecified ankle and joints of unspecified foot: Secondary | ICD-10-CM | POA: Insufficient documentation

## 2013-07-21 DIAGNOSIS — M25673 Stiffness of unspecified ankle, not elsewhere classified: Secondary | ICD-10-CM | POA: Insufficient documentation

## 2013-07-21 DIAGNOSIS — IMO0001 Reserved for inherently not codable concepts without codable children: Secondary | ICD-10-CM | POA: Insufficient documentation

## 2013-07-21 DIAGNOSIS — M25676 Stiffness of unspecified foot, not elsewhere classified: Secondary | ICD-10-CM | POA: Insufficient documentation

## 2013-07-27 ENCOUNTER — Ambulatory Visit: Payer: Self-pay | Admitting: Physical Therapy

## 2013-07-28 ENCOUNTER — Ambulatory Visit: Payer: Self-pay | Admitting: Physical Therapy

## 2013-08-03 ENCOUNTER — Ambulatory Visit: Payer: Self-pay | Admitting: Physical Therapy

## 2013-08-10 ENCOUNTER — Ambulatory Visit: Payer: Self-pay | Admitting: Physical Therapy

## 2013-08-17 ENCOUNTER — Encounter: Payer: Self-pay | Admitting: Physical Therapy

## 2013-08-18 ENCOUNTER — Ambulatory Visit: Payer: BC Managed Care – PPO | Attending: Family Medicine | Admitting: Physical Therapy

## 2013-08-18 DIAGNOSIS — M25676 Stiffness of unspecified foot, not elsewhere classified: Secondary | ICD-10-CM | POA: Insufficient documentation

## 2013-08-18 DIAGNOSIS — M25673 Stiffness of unspecified ankle, not elsewhere classified: Secondary | ICD-10-CM | POA: Insufficient documentation

## 2013-08-18 DIAGNOSIS — M25579 Pain in unspecified ankle and joints of unspecified foot: Secondary | ICD-10-CM | POA: Insufficient documentation

## 2013-08-18 DIAGNOSIS — IMO0001 Reserved for inherently not codable concepts without codable children: Secondary | ICD-10-CM | POA: Insufficient documentation

## 2013-08-24 ENCOUNTER — Encounter: Payer: Self-pay | Admitting: Physical Therapy

## 2013-08-25 ENCOUNTER — Ambulatory Visit: Payer: BC Managed Care – PPO | Admitting: Physical Therapy

## 2013-08-31 ENCOUNTER — Ambulatory Visit: Payer: BC Managed Care – PPO | Admitting: Physical Therapy

## 2013-09-13 ENCOUNTER — Encounter (INDEPENDENT_AMBULATORY_CARE_PROVIDER_SITE_OTHER): Payer: Self-pay

## 2013-09-14 ENCOUNTER — Telehealth: Payer: Self-pay | Admitting: Endocrinology

## 2013-09-14 MED ORDER — LEVOTHYROXINE SODIUM 50 MCG PO TABS: 50.0000 ug | ORAL_TABLET | Freq: Every day | ORAL | Status: DC

## 2013-10-05 ENCOUNTER — Ambulatory Visit (INDEPENDENT_AMBULATORY_CARE_PROVIDER_SITE_OTHER): Payer: BC Managed Care – PPO | Admitting: Endocrinology

## 2013-10-05 ENCOUNTER — Ambulatory Visit: Payer: BC Managed Care – PPO | Admitting: Endocrinology

## 2013-10-05 ENCOUNTER — Encounter: Payer: Self-pay | Admitting: Endocrinology

## 2013-10-05 VITALS — BP 132/78 | HR 97 | Ht 66.0 in | Wt 225.0 lb

## 2013-10-05 DIAGNOSIS — E890 Postprocedural hypothyroidism: Secondary | ICD-10-CM

## 2013-10-05 MED ORDER — LEVOTHYROXINE SODIUM 50 MCG PO TABS: 50.0000 ug | ORAL_TABLET | Freq: Every day | ORAL | Status: DC

## 2013-10-05 NOTE — Progress Notes (Signed)
Subjective:    Patient ID: Kimberly Ryan, female    DOB: [birth year], 62 y.o.   MRN: [id number]  HPI In 2006, pt underwent a left thyroid lobectomy, for a nodule which was suspicious on fine-needle cytology.  Result was benign.  US-guided bx in the remaining right lobe in 2013 showed a low-risk result.  F/U US in early 2014 was unchanged.   Past Medical History  Diagnosis Date  . Arthritis   . GERD (gastroesophageal reflux disease)   . Thyroid disease   . Anxiety   . Shortness of breath     with exertion   . [id number](478.2)     Past Surgical History  Procedure Laterality Date  . Rhinoplasty  1953  . Tonsillectomy  1954  . Myomectomy  2000  . Thyroid lobectomy  08/17/2012    Procedure: THYROID LOBECTOMY;  Surgeon: Atilano Ina, MD,FACS;  Location: [facility];  Service: General;  Laterality: Left;  Left Thyroid Lobectomy    History   Social History  . Marital Status: Married    Spouse Name: N/A    Number of Children: N/A  . Years of Education: N/A   Occupational History  . Not on file.   Social History Main Topics  . Smoking status: Former Smoker    Quit date: 12/15/1985  . Smokeless tobacco: Never Used     Comment: quit 25 yrs ago  . Alcohol Use: Yes     Comment: 3 cocktails per week   . Drug Use: No  . Sexual Activity:    Other Topics Concern  . Not on file   Social History Narrative  . No narrative on file    Current Outpatient Prescriptions on File Prior to Visit  Medication Sig Dispense Refill  . ALPRAZolam (XANAX) 0.5 MG tablet Take 0.5 mg by mouth at bedtime. Once at bed time      . b complex vitamins tablet Take 1 tablet by mouth 3 (three) times a week.       . beta carotene w/minerals (OCUVITE) tablet Take 1 tablet by mouth daily.      . calcium carbonate (OS-CAL) 600 MG TABS Take 200 mg by mouth 3 (three) times daily.       . calcium citrate (CALCITRATE - DOSED IN MG ELEMENTAL CALCIUM) 950 MG tablet Take 1 tablet by mouth 3 (three) times daily.       . cholecalciferol (VITAMIN D) 1000 UNITS tablet Take 1,000 Units by mouth daily.      Marland Kitchen desonide (DESOWEN) 0.05 % ointment Apply 1 application topically 2 (two) times daily. Apply to vulva region      . diphenhydrAMINE (BENADRYL) 25 mg capsule Take 25 mg by mouth at bedtime as needed. For sleep      . Docosahexaenoic Acid (DHA OMEGA 3 PO) Take 1 capsule by mouth.       Marland Kitchen ibuprofen (ADVIL,MOTRIN) 400 MG tablet Take 400 mg by mouth every 6 (six) hours as needed. For pain      . mirtazapine (REMERON) 7.5 MG tablet Take 3.75 mg by mouth at bedtime. Pt takes 1/4 of 15 mg pill at bedtime      . Multiple Vitamin (MULTIVITAMIN) tablet Take 0.5 tablets by mouth daily.       Marland Kitchen OVER THE COUNTER MEDICATION Take 1 tablet by mouth 3 (three) times a week. Ultra jarro-dophilus      . OVER THE COUNTER MEDICATION Take 1 tablet by mouth 4 (four) times daily. Immpower  immune booster - mushroom pt takes only as needed      . ranitidine (ZANTAC) 150 MG tablet Take 150 mg by mouth at bedtime as needed. For acid reflux at bedtime      . vitamin C (ASCORBIC ACID) 500 MG tablet Take 500 mg by mouth daily as needed.       . zolpidem (AMBIEN) 10 MG tablet Take 5 mg by mouth at bedtime as needed. For sleep       No current facility-administered medications on file prior to visit.    Allergies  Allergen Reactions  . Cephalexin Itching, Rash and Other (See Comments)    Eyes were tearing up    Family History  Problem Relation Age of Onset  . Heart disease Father     BP 132/78  Pulse 97  Ht 5\' 6"  (1.676 m)  Wt 225 lb (102.059 kg)  BMI 36.33 kg/m2  SpO2 96%  Review of Systems Denies weight change    Objective:   Physical Exam VITAL SIGNS:  See vs page GENERAL: no distress Neck: a healed scar is present.  i do not appreciate a nodule in the thyroid or elsewhere in the neck   Lab Results  Component Value Date   TSH 2.29 10/05/2013  (i reviewed the 2014 [place] report)    Assessment & Plan:  Multinodular  goiter, clinically stable Post-i-131 hypothyroidism, well-replaced

## 2013-10-05 NOTE — Patient Instructions (Addendum)
blood tests are being requested for you today.  We'll contact you with results.  Please return in 1 year. most of the time, a "lumpy thyroid" will eventually become overactive.  this is usually a slow process, happening over the span of many years.  This would first be manifest as a decrease in your need for the medication you take.   When i get the results from today's blood test, i'll refill you [facility].

## 2013-10-10 ENCOUNTER — Ambulatory Visit: Payer: BC Managed Care – PPO | Admitting: Endocrinology

## 2013-11-29 ENCOUNTER — Telehealth (INDEPENDENT_AMBULATORY_CARE_PROVIDER_SITE_OTHER): Payer: Self-pay | Admitting: *Deleted

## 2013-11-29 NOTE — Telephone Encounter (Signed)
I spoke with pts husband to inform him of pts follow up thyroid US at [facility] on 12/16/13 with an arrival time of 10:45am.  He will relay this information to the pt.

## 2013-12-16 ENCOUNTER — Other Ambulatory Visit: Payer: BC Managed Care – PPO

## 2013-12-21 ENCOUNTER — Ambulatory Visit
Admission: RE | Admit: 2013-12-21 | Discharge: 2013-12-21 | Disposition: A | Discharge status: Home / Self Care | Payer: BC Managed Care – PPO | Source: Ambulatory Visit | Attending: General Surgery | Admitting: General Surgery

## 2013-12-21 DIAGNOSIS — E042 Nontoxic multinodular goiter: Secondary | ICD-10-CM

## 2013-12-23 ENCOUNTER — Telehealth (INDEPENDENT_AMBULATORY_CARE_PROVIDER_SITE_OTHER): Payer: Self-pay

## 2013-12-23 IMAGING — CR DG CHEST 2V
2 series · 2 of 2 positions shown · non-contrast
Comparison: None.

CLINICAL DATA: Preop.

CHEST - 2 VIEW

[w chest pa]
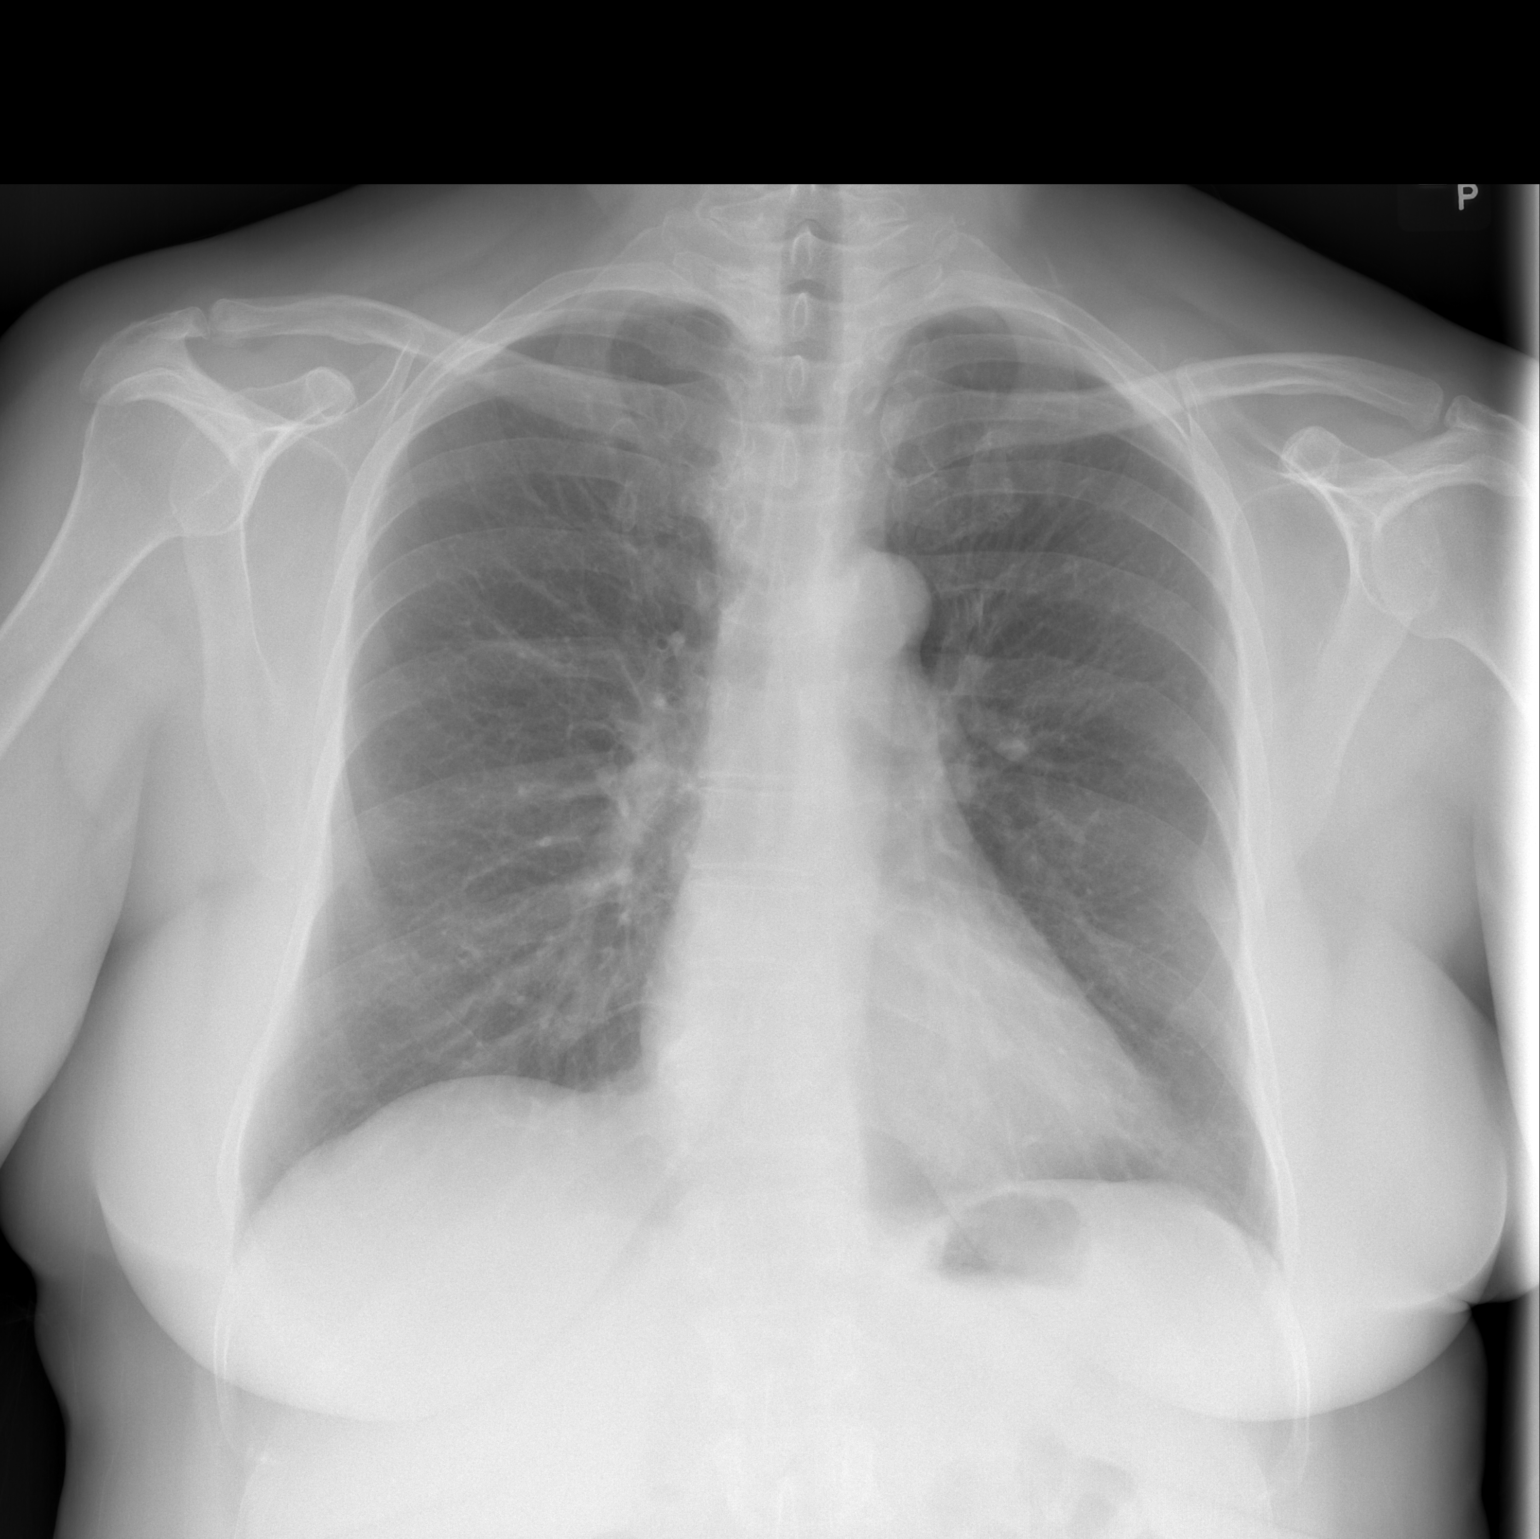

[w chest lat]
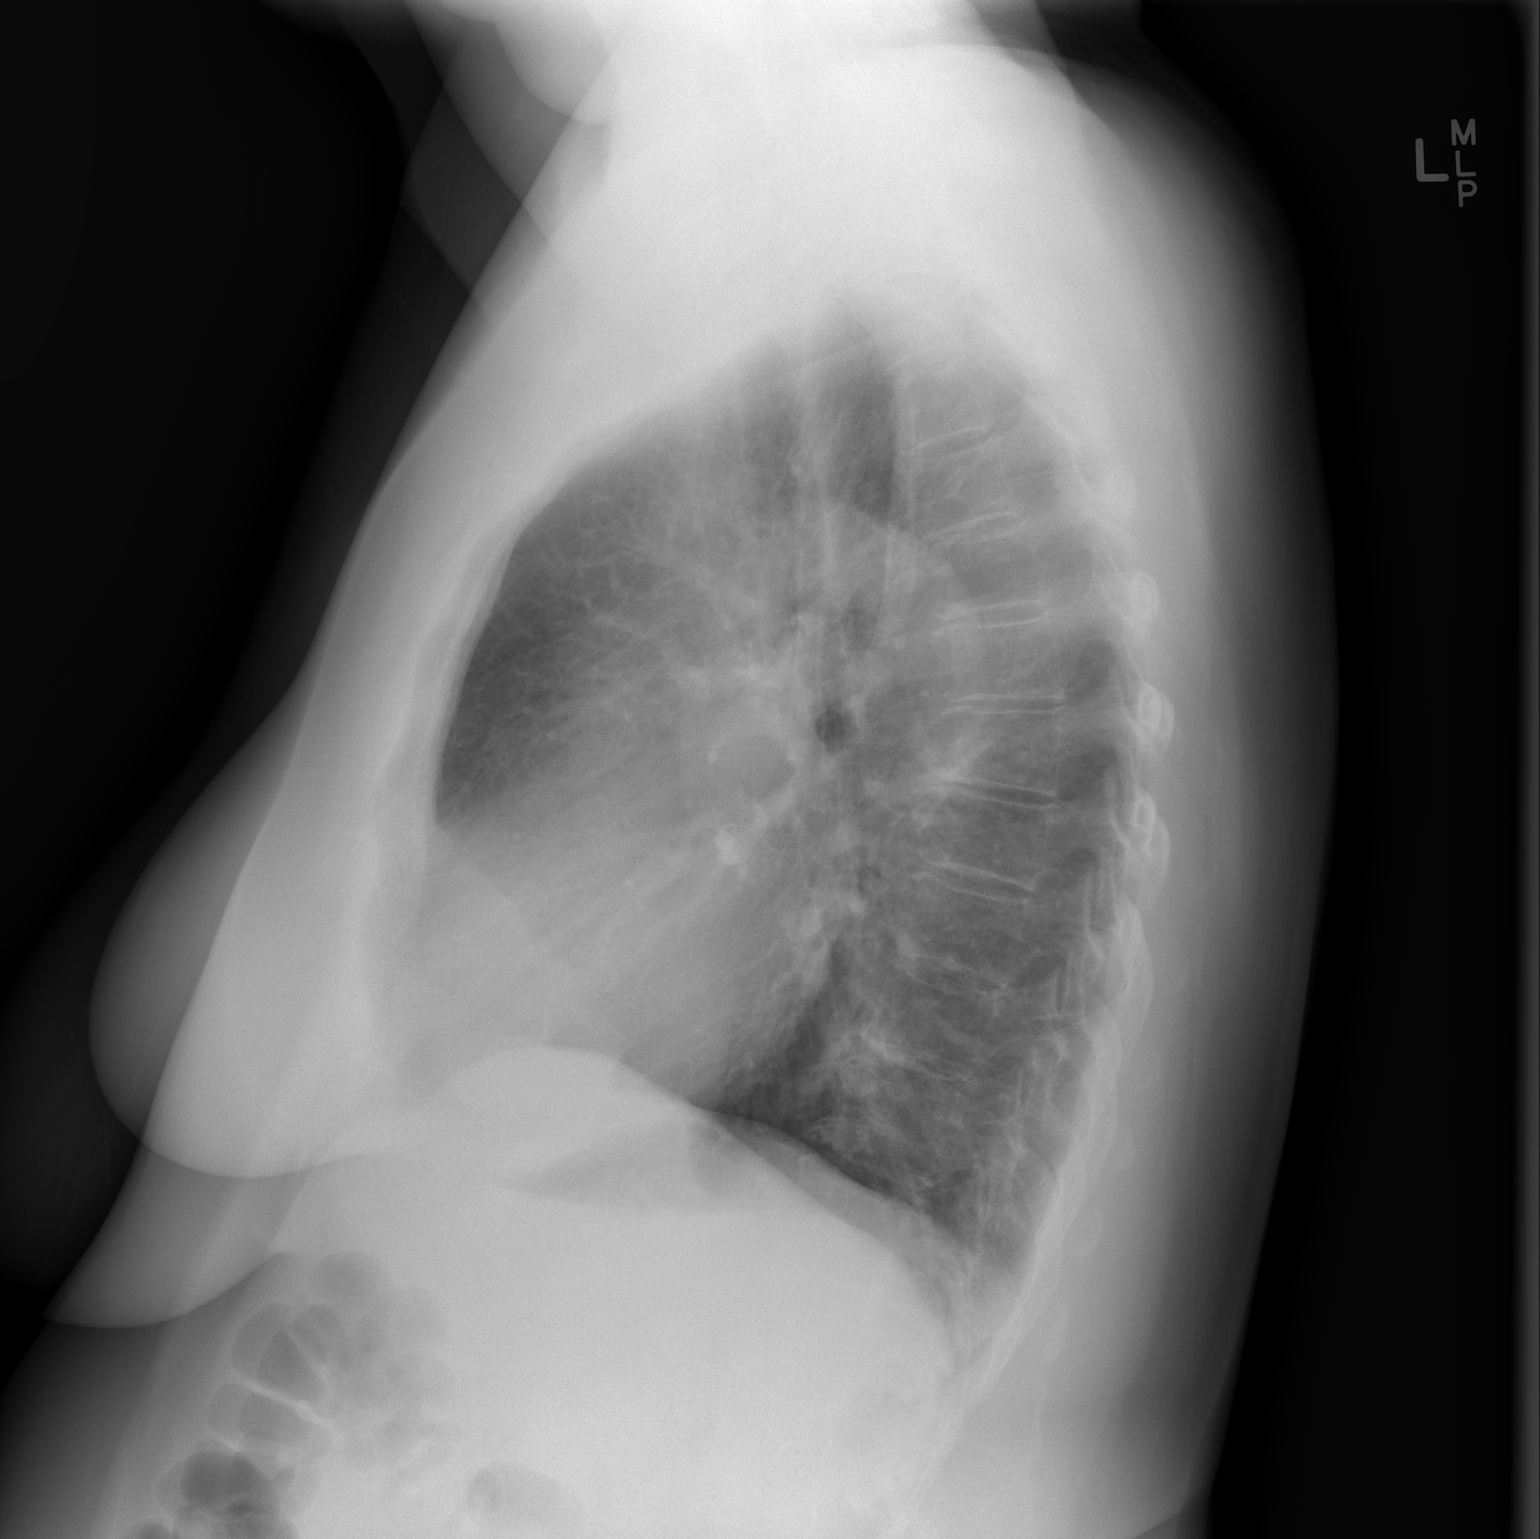

[2 of 2 positions shown; findings below may reference images not displayed]

FINDINGS: Trachea is midline.  Heart size normal.  Lungs are clear.
No pleural fluid.
IMPRESSION: No acute findings.

## 2013-12-23 NOTE — Telephone Encounter (Signed)
LMOM for pt to call back. I want to give her the results from the thyroid u/s that the right thyroid nodule is stable with no change in size per Dr Redmond Pulling.

## 2013-12-26 NOTE — Telephone Encounter (Signed)
Patient aware of thy nodule no change in size.

## 2014-02-20 ENCOUNTER — Telehealth: Payer: Self-pay | Admitting: Endocrinology

## 2014-02-20 DIAGNOSIS — E890 Postprocedural hypothyroidism: Secondary | ICD-10-CM

## 2014-02-20 NOTE — Telephone Encounter (Signed)
Pt informed and she states if she wants to do lab work she will call office back and let us know.

## 2014-02-20 NOTE — Telephone Encounter (Signed)
Pt states she may be having a reaction to her medication  Please call   Call back: [phone]  Thank You:)

## 2014-02-20 NOTE — Telephone Encounter (Signed)
You don't have to worry about the thyroid medication causing this, bu i would be happy to recheck the blood tests. Other options are: i could ref you to eye dr See your pcp. and ask her about this.

## 2014-02-20 NOTE — Telephone Encounter (Signed)
Pt states that she has been having issues with her eyes watering and multiple eye infections. She states that on Saturday she did not take her thyroid medication and she did not have any issues with her eyes watering.She wanted to know if she could come of the generic levothyroxine and go on the brand synthroid.  Please advise, Thanks!

## 2014-02-21 ENCOUNTER — Other Ambulatory Visit (INDEPENDENT_AMBULATORY_CARE_PROVIDER_SITE_OTHER): Payer: BC Managed Care – PPO

## 2014-02-21 DIAGNOSIS — E890 Postprocedural hypothyroidism: Secondary | ICD-10-CM

## 2014-02-21 NOTE — Telephone Encounter (Signed)
Orders placed, pt came to office on 3/10 to have labs.

## 2014-02-22 ENCOUNTER — Telehealth: Payer: Self-pay | Admitting: Endocrinology

## 2014-02-22 ENCOUNTER — Other Ambulatory Visit: Payer: BC Managed Care – PPO

## 2014-02-22 LAB — TSH: TSH: 1.85 u[IU]/mL (ref 0.35–5.50)

## 2014-02-22 NOTE — Telephone Encounter (Signed)
Pt would like lab results    Call back: [phone]   Thank you:)

## 2014-02-23 ENCOUNTER — Other Ambulatory Visit: Payer: Self-pay | Admitting: Endocrinology

## 2014-02-23 MED ORDER — LEVOTHYROXINE SODIUM 50 MCG PO TABS: 50.0000 ug | ORAL_TABLET | Freq: Every day | ORAL | Status: DC

## 2014-02-23 NOTE — Telephone Encounter (Signed)
Pt informed of lab results. 

## 2014-02-27 ENCOUNTER — Other Ambulatory Visit: Payer: Self-pay

## 2014-02-27 MED ORDER — LEVOTHYROXINE SODIUM 50 MCG PO TABS: 50.0000 ug | ORAL_TABLET | Freq: Every day | ORAL | Status: DC

## 2014-02-27 NOTE — Telephone Encounter (Signed)
Pt wants one month supply of brand name synthroid called into [facility]

## 2014-02-27 NOTE — Telephone Encounter (Signed)
Pt calling today wanted follow up regarding getting the brand name of synthroid.

## 2014-02-27 NOTE — Telephone Encounter (Signed)
Medication sent.

## 2014-03-15 ENCOUNTER — Other Ambulatory Visit (HOSPITAL_COMMUNITY)
Admission: RE | Admit: 2014-03-15 | Discharge: 2014-03-15 | Disposition: A | Discharge status: Home / Self Care | Payer: BC Managed Care – PPO | Source: Ambulatory Visit | Attending: Family Medicine | Admitting: Family Medicine

## 2014-03-15 ENCOUNTER — Other Ambulatory Visit: Payer: Self-pay | Admitting: Family Medicine

## 2014-03-15 DIAGNOSIS — Z124 Encounter for screening for malignant neoplasm of cervix: Secondary | ICD-10-CM | POA: Insufficient documentation

## 2014-03-27 NOTE — Telephone Encounter (Signed)
Error

## 2014-04-03 ENCOUNTER — Other Ambulatory Visit: Payer: Self-pay

## 2014-04-03 ENCOUNTER — Telehealth: Payer: Self-pay | Admitting: Endocrinology

## 2014-04-03 MED ORDER — LEVOTHYROXINE SODIUM 50 MCG PO TABS
50.0000 ug | ORAL_TABLET | Freq: Every day | ORAL | Status: DC
Start: 2014-04-03 — End: 2014-08-15

## 2014-05-03 IMAGING — US US SOFT TISSUE HEAD/NECK
1 series · 14 of 25 positions shown · non-contrast
Comparison: Ultrasound of the thyroid of 06/28/2012

CLINICAL DATA: Follow up thyroid nodule

THYROID ULTRASOUND
TECHNIQUE: Ultrasound examination of the thyroid gland and adjacent
soft tissues was performed.

[Series 1: us soft tissue head/neck · 0.10mm/px · 14 of 41 slices shown]
[im 1/41]
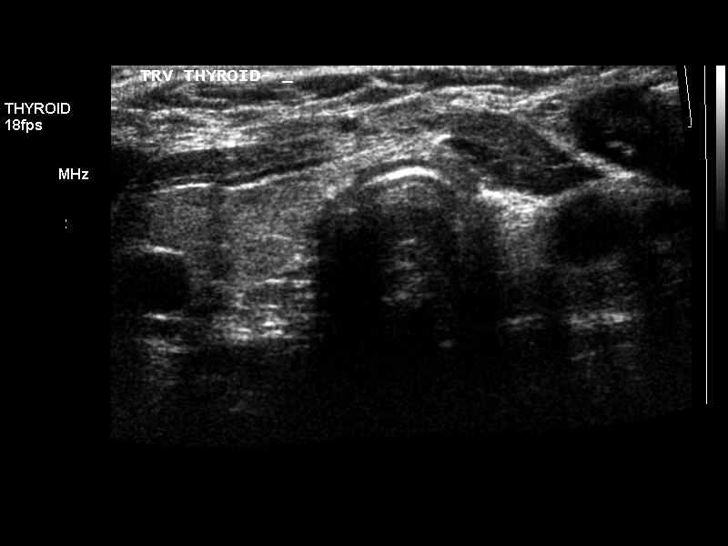
[im 4/41]
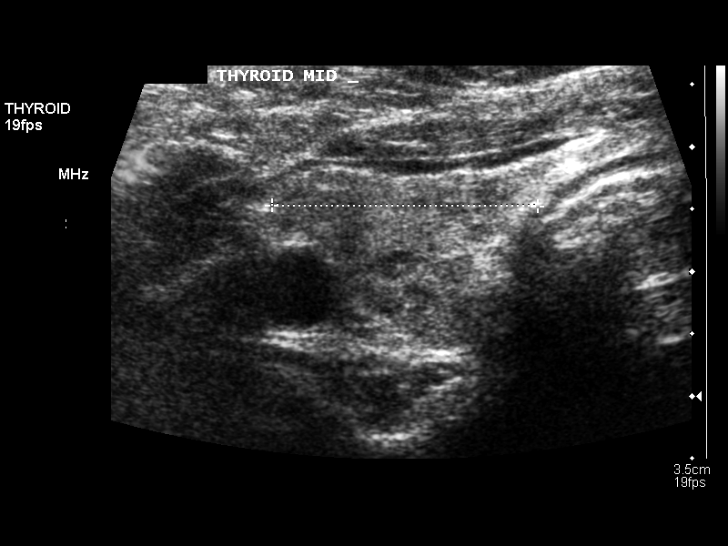
[im 7/41]
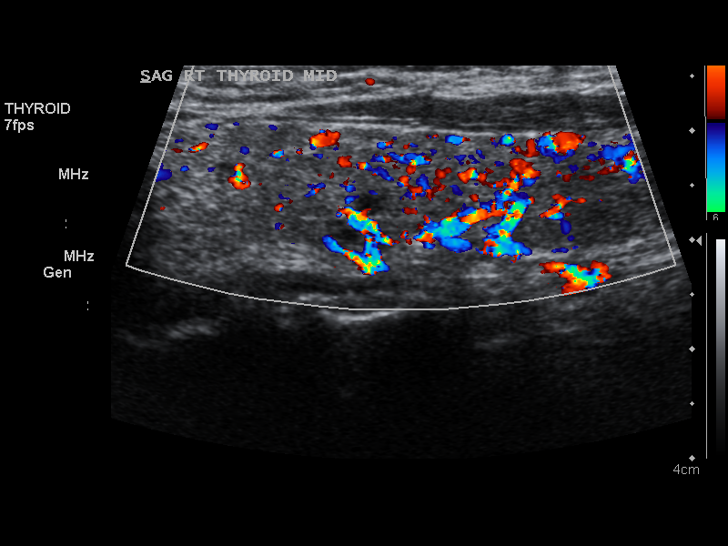
[im 11/41]
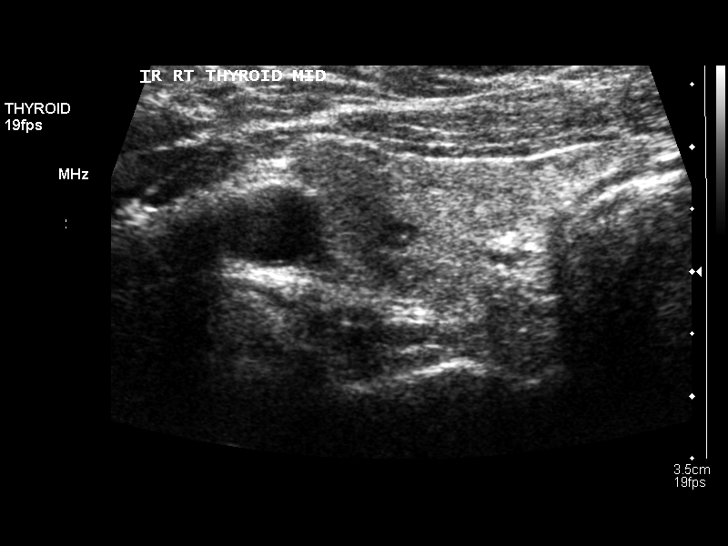
[im 14/41]
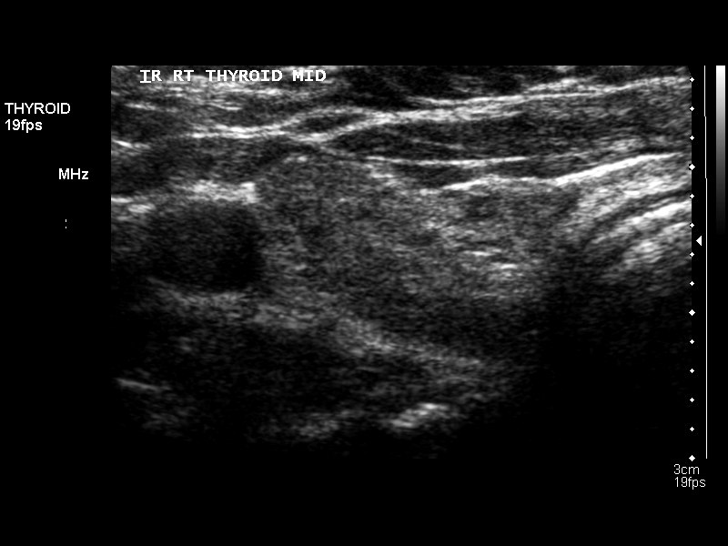
[im 16/41]
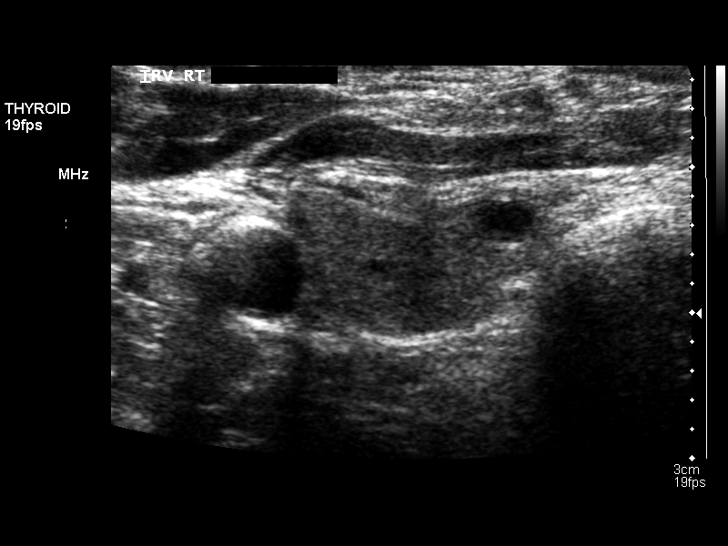
[im 19/41]
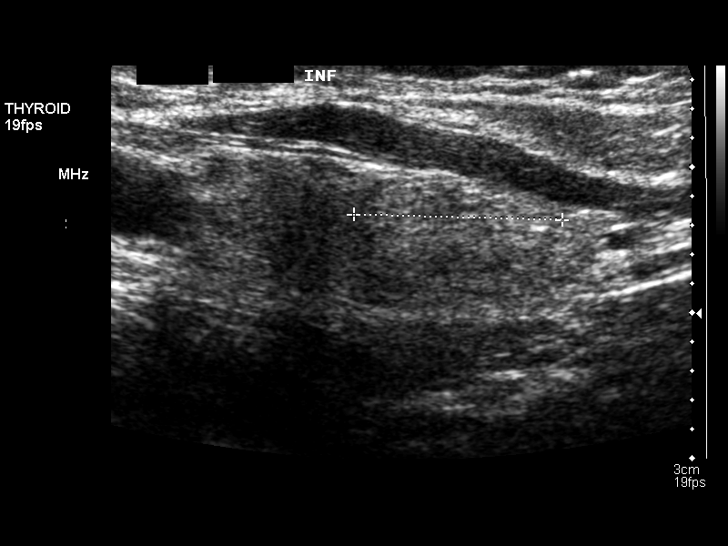
[im 22/41]
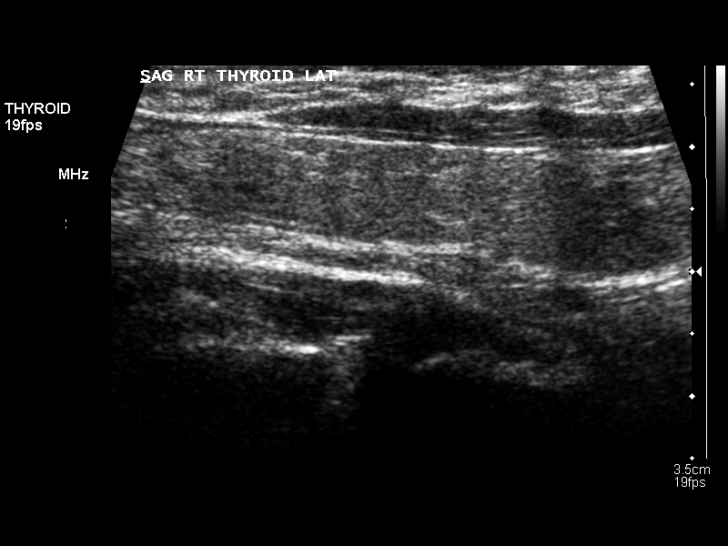
[im 26/41]
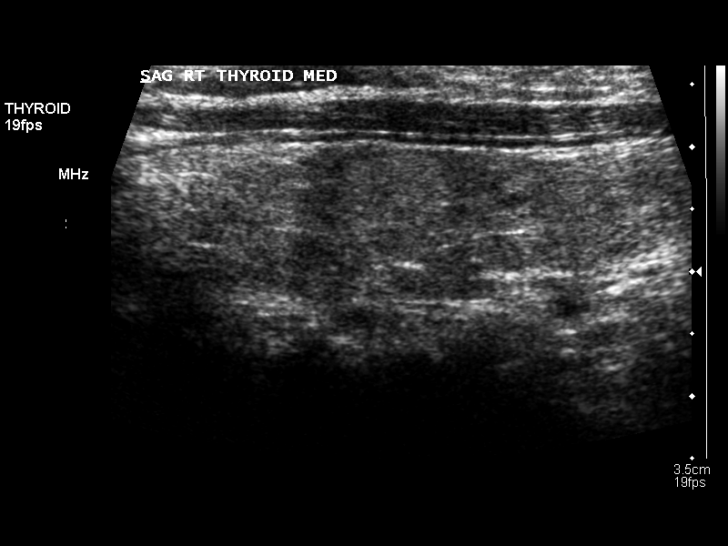
[im 27/41]
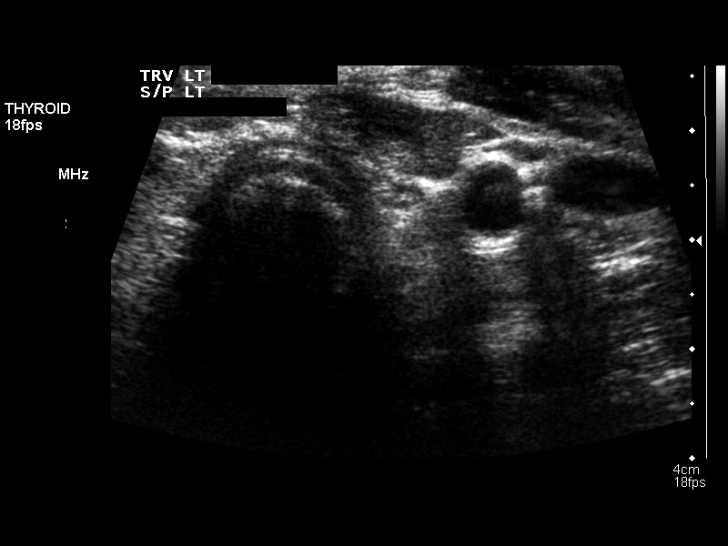
[im 31/41]
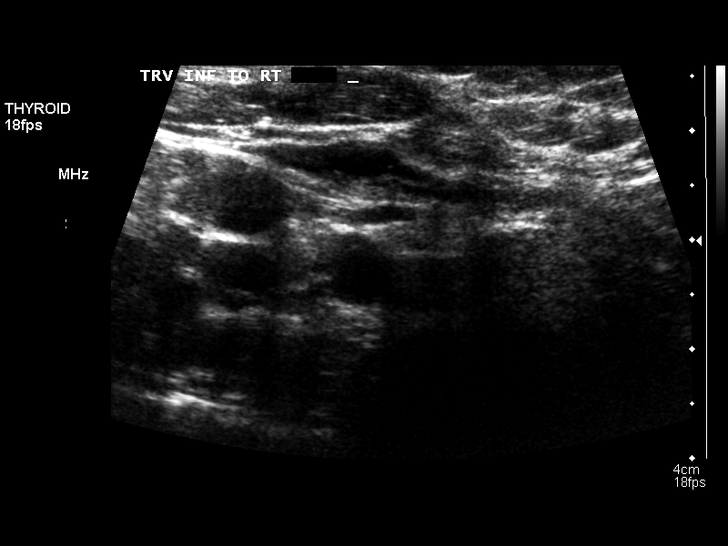
[im 34/41]
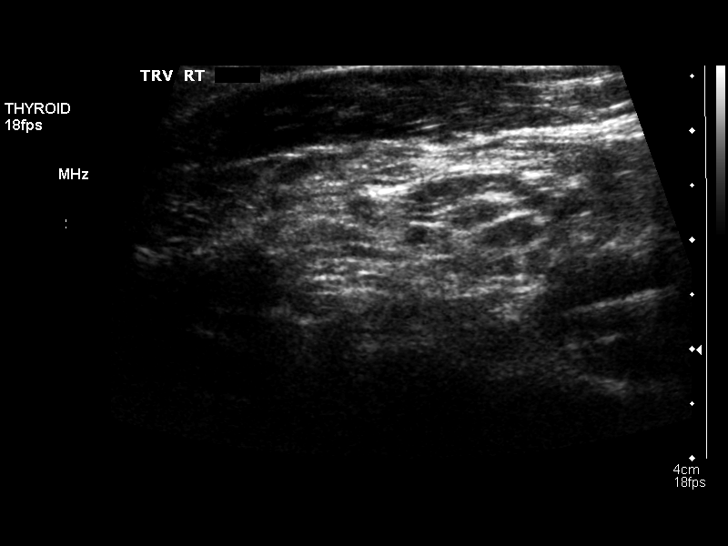
[im 37/41]
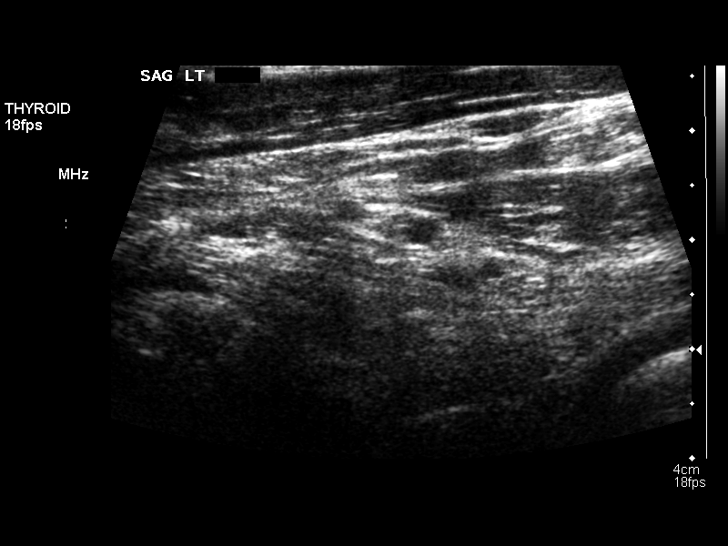
[im 41/41]
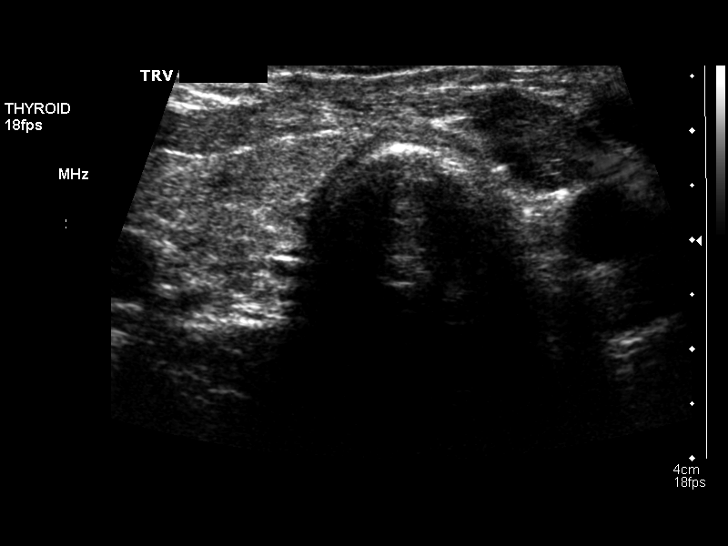

[14 of 25 positions shown; findings below may reference images not displayed]

FINDINGS: Right thyroid lobe:  6.2 x 1.4 x 2.1 cm.  (Previously 5.9 x 1.3 x
2.0 cm).
Left thyroid lobe:  The left lobe has in the interval been
resected.
Isthmus:  2.6 mm in thickness.

Focal nodules:  The echogenicity of the right lobe of thyroid is
inhomogeneous.  Right-sided nodules again are noted.  The dominant
nodule is in the lower pole measuring 1.4 x 0.8 x 1.3 cm compared
to prior measurements of 1.4 x 0.8 x 1.3 cm.  Other smaller nodules
are present of no more than 4 mm in diameter.

Lymphadenopathy:  None visualized.
IMPRESSION: 1.  Left thyroidectomy.
2.  Stable right thyroid nodules.

## 2014-05-10 ENCOUNTER — Ambulatory Visit (INDEPENDENT_AMBULATORY_CARE_PROVIDER_SITE_OTHER): Payer: BC Managed Care – PPO | Admitting: General Surgery

## 2014-05-10 ENCOUNTER — Encounter (INDEPENDENT_AMBULATORY_CARE_PROVIDER_SITE_OTHER): Payer: Self-pay | Admitting: General Surgery

## 2014-05-10 VITALS — BP 124/80 | HR 87 | Temp 97.1°F | Ht 66.0 in | Wt 223.0 lb

## 2014-05-10 DIAGNOSIS — E042 Nontoxic multinodular goiter: Secondary | ICD-10-CM

## 2014-05-10 NOTE — Patient Instructions (Signed)
Follow up with Dr. Ellison 

## 2014-05-11 NOTE — Progress Notes (Signed)
Subjective:     Patient ID: Kimberly Ryan, female   DOB: [birth year], 63 y.o.   MRN: [id number]  HPI 63 year old Caucasian female comes in for postoperative check after undergoing a left thyroid lobectomy for follicular lesion with hyperplastic cells based on an FNA biopsy. Her surgery was performed on August 17 2012. She was last seen in office in 04/2013. She states that she has done well. She denies any fever or chills. She denies any difficulty swallowing. She reports a good appetite. She states her voice is normal. She had a followup neck ultrasound in Jan 2015. She is taking her synthroid.  She denies any hoarseness in her voice. She denies any constipation, palpitations, heat/cold intolerance. She did have some eye discomfort and eye issues and ultimately found to have an allergy to a component of the gum that she was chewing  PMHx, PSHx, SOCHx, FAMHx, ALL reviewed  THYROID ULTRASOUND 12/22/13 TECHNIQUE:  Ultrasound examination of the thyroid gland and adjacent soft  tissues was performed.  COMPARISON: 12/21/2012  FINDINGS:  Right thyroid lobe  Measurements: 6.1 x 1.6 x 2.3 cm. Solid nodule in the inferior right  lobe shows stable morphology and size, measuring approximately 1.4 x  0.7 x 1.4 cm. 0.6 cm cyst has a benign appearance. Some other tiny  subcentimeter cystic areas are also identified.  Left thyroid lobe  Surgically absent.  Isthmus  Thickness: 0.1 cm. No nodules visualized.  Lymphadenopathy  None visualized.  IMPRESSION:  Stable right thyroid nodule.    Review of Systems 10 point ROS performed and negative except for above    Objective:   Physical Exam BP 124/80  Pulse 87  Temp(Src) 97.1 F (36.2 C)  Ht 5\' 6"  (1.676 m)  Wt 223 lb (101.152 kg)  BMI 36.01 kg/m2 Alert, no apparent distress Pulmonary-lungs are clear Cardiac-regular rhythm Neck-well healed Kocher incision. No cellulitis, induration or fluctuance. No hematoma. 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 She is almost 2 years out from surgery. There is no clinical evidence of disease. At this point I do not think she needs ongoing routine neck ultrasound. I encouraged her to continue follow Dr. Loanne Drilling for ongoing TSH monitoring. F/u prn  Leighton Ruff. Redmond Pulling, MD, FACS General, Bariatric, & Minimally Invasive Surgery [facility], [place]

## 2014-08-15 ENCOUNTER — Telehealth: Payer: Self-pay | Admitting: Endocrinology

## 2014-08-15 MED ORDER — LEVOTHYROXINE SODIUM 50 MCG PO TABS: 50.0000 ug | ORAL_TABLET | Freq: Every day | ORAL | Status: DC

## 2014-08-15 NOTE — Telephone Encounter (Signed)
Rx sent per pt's request.  

## 2014-08-15 NOTE — Telephone Encounter (Signed)
Patient would like a refill of Levothyroxine 50 mg, generic brand.and sent to [facility].

## 2014-08-16 ENCOUNTER — Telehealth: Payer: Self-pay | Admitting: Endocrinology

## 2014-08-16 MED ORDER — LEVOTHYROXINE SODIUM 50 MCG PO TABS: 50.0000 ug | ORAL_TABLET | Freq: Every day | ORAL | Status: DC

## 2014-08-16 NOTE — Telephone Encounter (Signed)
Rx sent in per pt's request.

## 2014-08-16 NOTE — Telephone Encounter (Signed)
Pt needs rx for generic levothyroxin called into [facility] 1 mon supply please quantity 30 50 mcg dosage

## 2014-10-02 ENCOUNTER — Other Ambulatory Visit: Payer: BC Managed Care – PPO

## 2014-10-02 ENCOUNTER — Other Ambulatory Visit: Payer: Self-pay

## 2014-10-02 ENCOUNTER — Other Ambulatory Visit (INDEPENDENT_AMBULATORY_CARE_PROVIDER_SITE_OTHER): Payer: BC Managed Care – PPO

## 2014-10-02 DIAGNOSIS — E890 Postprocedural hypothyroidism: Secondary | ICD-10-CM

## 2014-10-02 LAB — TSH: TSH: 0.9 u[IU]/mL (ref 0.35–4.50)

## 2014-10-05 ENCOUNTER — Ambulatory Visit (INDEPENDENT_AMBULATORY_CARE_PROVIDER_SITE_OTHER): Payer: BC Managed Care – PPO | Admitting: Endocrinology

## 2014-10-05 VITALS — BP 109/74 | HR 87 | Temp 97.7°F | Ht 66.0 in | Wt 223.0 lb

## 2014-10-05 DIAGNOSIS — E890 Postprocedural hypothyroidism: Secondary | ICD-10-CM

## 2014-10-05 MED ORDER — LEVOTHYROXINE SODIUM 50 MCG PO TABS
50.0000 ug | ORAL_TABLET | Freq: Every day | ORAL | Status: AC
Start: 2014-10-05 — End: ?

## 2014-10-05 NOTE — Patient Instructions (Addendum)
Please continue the same thyroid medication. Please return in 1 year.   most of the time, a "lumpy thyroid" will eventually become overactive.  this is usually a slow process, happening over the span of many years.  This would first be manifest as a decrease in your need for the medication you take.   You can take a year off from the ultrasound.

## 2014-10-05 NOTE — Progress Notes (Signed)
Subjective:    Patient ID: Kimberly Ryan, female    DOB: 1951-09-06, 63 y.o.   MRN: [id number]  HPI Pt returns for f/u of multinodular goiter and postsurgical hypothyroidism (In 2006, pt underwent a left thyroid lobectomy, for a nodule which was suspicious on fine-needle cytology; result was benign; in 2013, TSH was noted to be high, and she was started on synthroid; she has been on it since then.  US-guided bx in the remaining right lobe in 2013 showed a low-risk result.  F/U US in early 2014 was unchanged).  She does not notice any thyroid enlargement. Past Medical History  Diagnosis Date  . Arthritis   . GERD (gastroesophageal reflux disease)   . Thyroid disease   . Anxiety   . Shortness of breath     with exertion   . QPYPPJKD(326.7)     Past Surgical History  Procedure Laterality Date  . Rhinoplasty  1953  . Tonsillectomy  1954  . Myomectomy  2000  . Thyroid lobectomy  08/17/2012    Procedure: THYROID LOBECTOMY;  Surgeon: [name], MD,FACS;  Location: [facility];  Service: General;  Laterality: Left;  Left Thyroid Lobectomy    History   Social History  . Marital Status: Married    Spouse Name: N/A    Number of Children: N/A  . Years of Education: N/A   Occupational History  . Not on file.   Social History Main Topics  . Smoking status: Former Smoker    Quit date: 12/15/1985  . Smokeless tobacco: Never Used     Comment: quit 25 yrs ago  . Alcohol Use: Yes     Comment: 3 cocktails per week   . Drug Use: No  . Sexual Activity:    Other Topics Concern  . Not on file   Social History Narrative  . No narrative on file    Current Outpatient Prescriptions on File Prior to Visit  Medication Sig Dispense Refill  . ALPRAZolam (XANAX) 0.5 MG tablet Take 0.5 mg by mouth at bedtime. Once at bed time      . b complex vitamins tablet Take 1 tablet by mouth 3 (three) times a week.       . beta carotene w/minerals (OCUVITE) tablet Take 1 tablet by mouth daily.       . calcium carbonate (OS-CAL) 600 MG TABS Take 200 mg by mouth 3 (three) times daily.       . calcium citrate (CALCITRATE - DOSED IN MG ELEMENTAL CALCIUM) 950 MG tablet Take 1 tablet by mouth 3 (three) times daily.      . cholecalciferol (VITAMIN D) 1000 UNITS tablet Take 1,000 Units by mouth daily.      Marland Kitchen desonide (DESOWEN) 0.05 % ointment Apply 1 application topically 2 (two) times daily. Apply to vulva region      . diphenhydrAMINE (BENADRYL) 25 mg capsule Take 25 mg by mouth at bedtime as needed. For sleep      . Docosahexaenoic Acid (DHA OMEGA 3 PO) Take 1 capsule by mouth.       Marland Kitchen ibuprofen (ADVIL,MOTRIN) 400 MG tablet Take 400 mg by mouth every 6 (six) hours as needed. For pain      . mirtazapine (REMERON) 7.5 MG tablet Take 3.75 mg by mouth at bedtime. Pt takes 1/4 of 15 mg pill at bedtime      . Multiple Vitamin (MULTIVITAMIN) tablet Take 0.5 tablets by mouth daily.       Marland Kitchen  OVER THE COUNTER MEDICATION Take 1 tablet by mouth 3 (three) times a week. Ultra jarro-dophilus      . OVER THE COUNTER MEDICATION Take 1 tablet by mouth 4 (four) times daily. Immpower immune booster - mushroom pt takes only as needed      . ranitidine (ZANTAC) 150 MG tablet Take 150 mg by mouth at bedtime as needed. For acid reflux at bedtime      . vitamin C (ASCORBIC ACID) 500 MG tablet Take 500 mg by mouth daily as needed.       . zolpidem (AMBIEN) 10 MG tablet Take 5 mg by mouth at bedtime as needed. For sleep       No current facility-administered medications on file prior to visit.    Allergies  Allergen Reactions  . Cephalexin Itching, Rash and Other (See Comments)    Eyes were tearing up    Family History  Problem Relation Age of Onset  . Heart disease Father    BP 109/74  Pulse 87  Temp(Src) 97.7 F (36.5 C) (Oral)  Ht _0  (1.676 m)  Wt 223 lb (101.152 kg)  BMI 36.01 kg/m2  SpO2 94%  Review of Systems Denies tremor.     Objective:   Physical Exam VITAL SIGNS:  See vs page GENERAL:  no distress Neck: a healed scar is present.  i do not appreciate a nodule in the thyroid or elsewhere in the neck.      Assessment & Plan:  Postsurgical hypothyroidism.  Well-replaced.  Multinodular goiter: not clinically evident, but unchanged on [place] earlier this year.   (i told pt she may also ask Dr Drema Dallas to follow TSH, and ret here prn--fine with me)  Patient is advised the following: Patient Instructions  Please continue the same thyroid medication. Please return in 1 year.   most of the time, a "lumpy thyroid" will eventually become overactive.  this is usually a slow process, happening over the span of many years.  This would first be manifest as a decrease in your need for the medication you take.   You can take a year off from the ultrasound.

## 2015-03-15 NOTE — Telephone Encounter (Signed)
Error

## 2015-06-29 ENCOUNTER — Other Ambulatory Visit: Payer: Self-pay | Admitting: Family Medicine

## 2015-06-29 DIAGNOSIS — R0989 Other specified symptoms and signs involving the circulatory and respiratory systems: Secondary | ICD-10-CM

## 2015-07-02 ENCOUNTER — Other Ambulatory Visit: Payer: Self-pay

## 2015-07-12 ENCOUNTER — Encounter (INDEPENDENT_AMBULATORY_CARE_PROVIDER_SITE_OTHER): Payer: Self-pay

## 2015-07-12 ENCOUNTER — Ambulatory Visit
Admission: RE | Admit: 2015-07-12 | Discharge: 2015-07-12 | Disposition: A | Discharge status: Home / Self Care | Payer: 59 | Source: Ambulatory Visit | Attending: Family Medicine | Admitting: Family Medicine

## 2015-07-12 DIAGNOSIS — R0989 Other specified symptoms and signs involving the circulatory and respiratory systems: Secondary | ICD-10-CM

## 2016-08-15 DIAGNOSIS — R300 Dysuria: Secondary | ICD-10-CM | POA: Diagnosis not present

## 2016-08-15 DIAGNOSIS — Z8781 Personal history of (healed) traumatic fracture: Secondary | ICD-10-CM | POA: Diagnosis not present

## 2016-08-15 DIAGNOSIS — Z23 Encounter for immunization: Secondary | ICD-10-CM | POA: Diagnosis not present

## 2016-08-15 DIAGNOSIS — M810 Age-related osteoporosis without current pathological fracture: Secondary | ICD-10-CM | POA: Diagnosis not present

## 2016-09-11 DIAGNOSIS — L900 Lichen sclerosus et atrophicus: Secondary | ICD-10-CM | POA: Diagnosis not present

## 2016-09-11 DIAGNOSIS — L570 Actinic keratosis: Secondary | ICD-10-CM | POA: Diagnosis not present

## 2016-09-11 DIAGNOSIS — Z808 Family history of malignant neoplasm of other organs or systems: Secondary | ICD-10-CM | POA: Diagnosis not present

## 2016-09-11 DIAGNOSIS — Z23 Encounter for immunization: Secondary | ICD-10-CM | POA: Diagnosis not present

## 2016-09-11 DIAGNOSIS — L821 Other seborrheic keratosis: Secondary | ICD-10-CM | POA: Diagnosis not present

## 2016-09-11 DIAGNOSIS — B079 Viral wart, unspecified: Secondary | ICD-10-CM | POA: Diagnosis not present

## 2016-10-28 DIAGNOSIS — E559 Vitamin D deficiency, unspecified: Secondary | ICD-10-CM | POA: Diagnosis not present

## 2016-10-28 DIAGNOSIS — M810 Age-related osteoporosis without current pathological fracture: Secondary | ICD-10-CM | POA: Diagnosis not present

## 2016-11-21 IMAGING — US US CAROTID DUPLEX BILAT
1 series · 13 of 24 positions shown · non-contrast
Comparison: None.

CLINICAL DATA: Left-sided carotid bruit. History of hyperlipidemia,
and smoking

EXAM:
BILATERAL CAROTID DUPLEX ULTRASOUND
TECHNIQUE: Gray scale imaging, color Doppler and duplex ultrasound were
performed of bilateral carotid and vertebral arteries in the neck.

[Series 1: us carotid duplex bilat · 0.08mm/px · 13 of 60 slices shown]
[im 1/60]
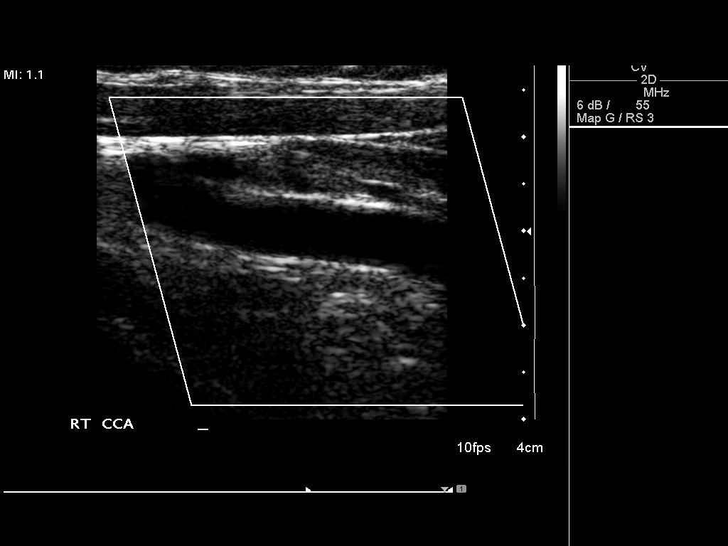
[im 6/60]
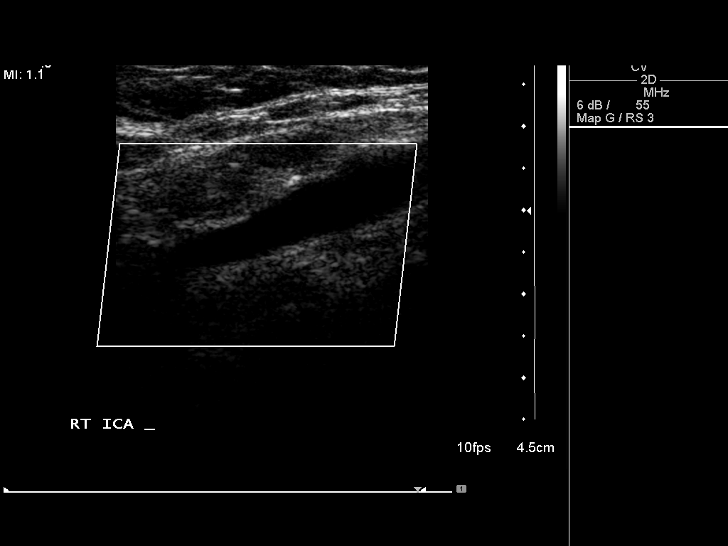
[im 11/60]
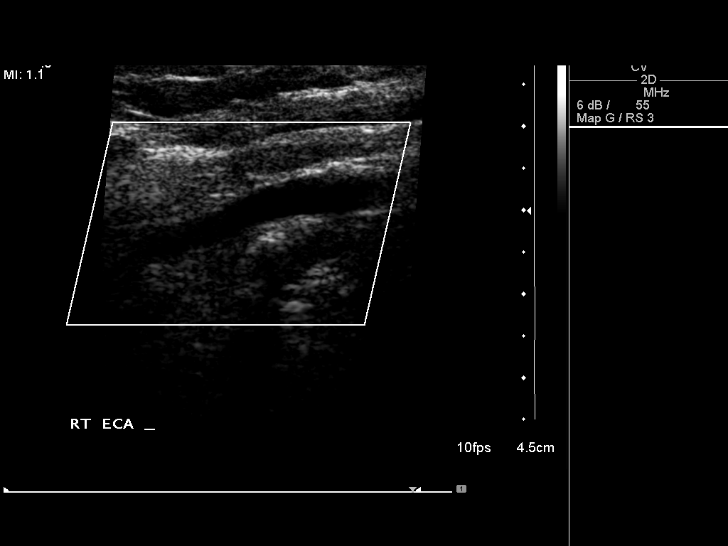
[im 16/60]
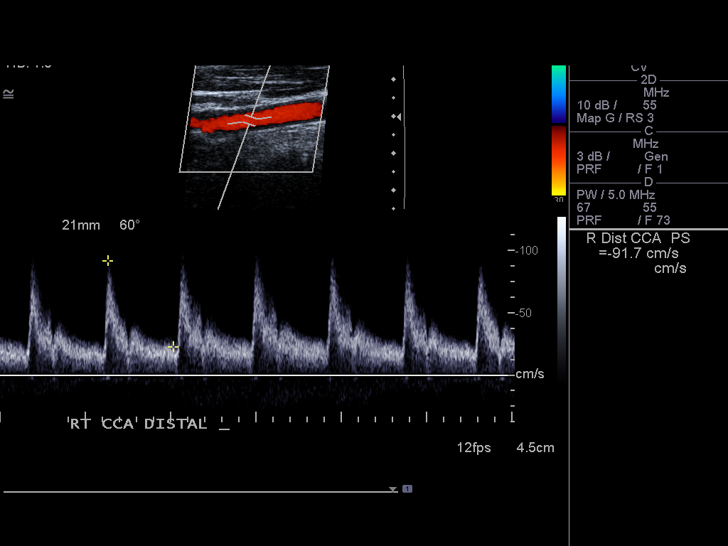
[im 21/60]
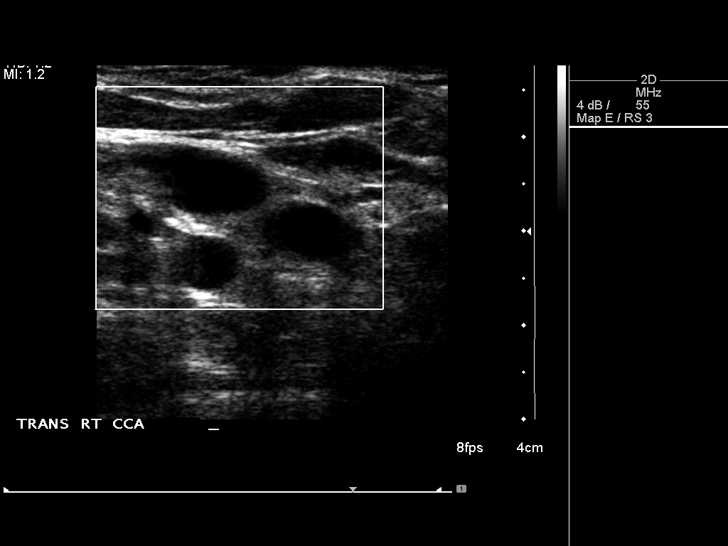
[im 26/60]
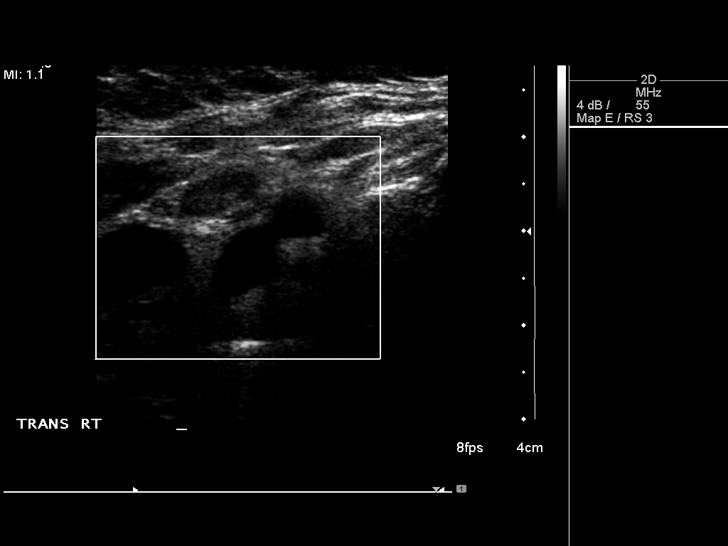
[im 31/60]
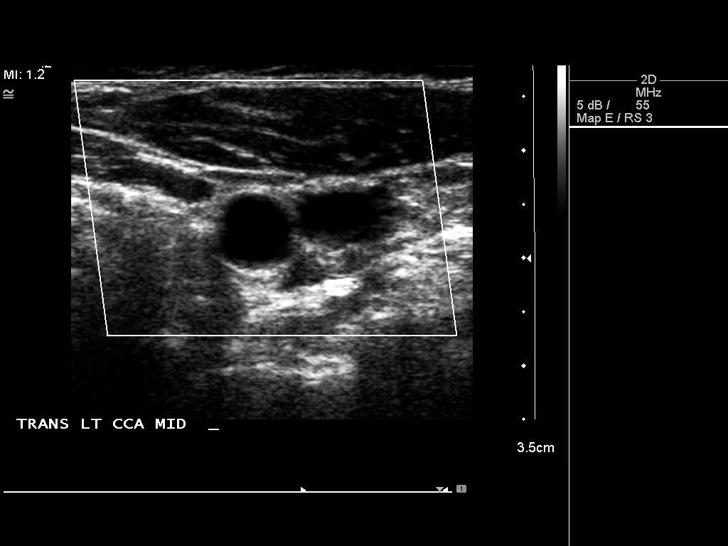
[im 34/60]
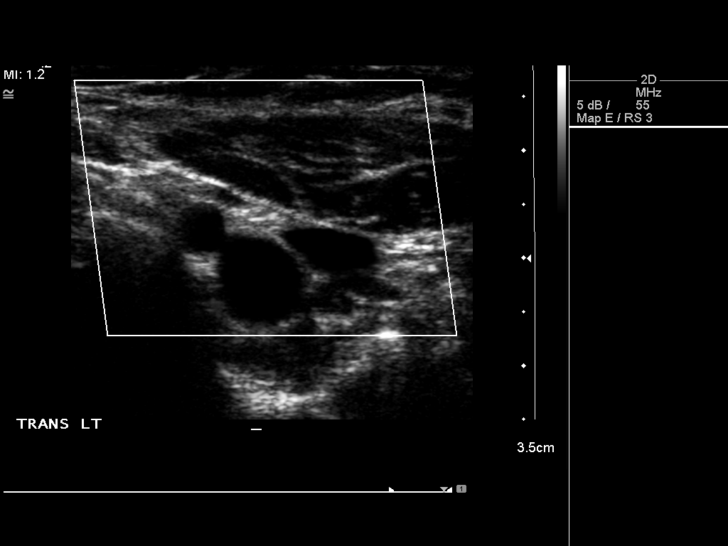
[im 39/60]
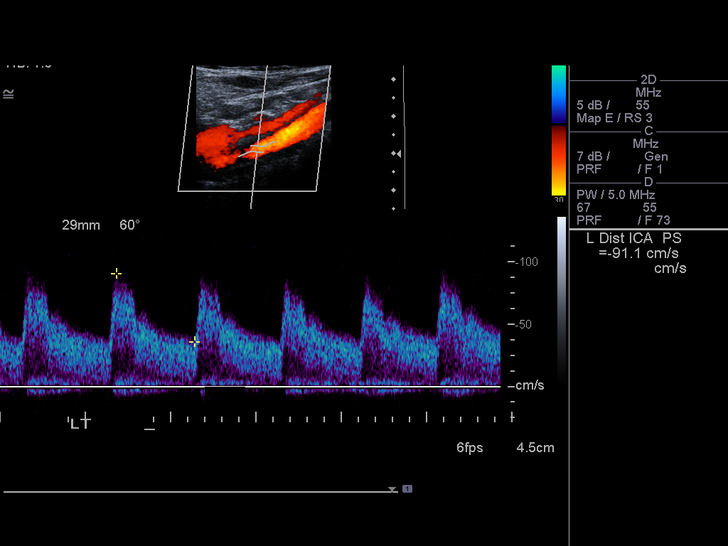
[im 44/60]
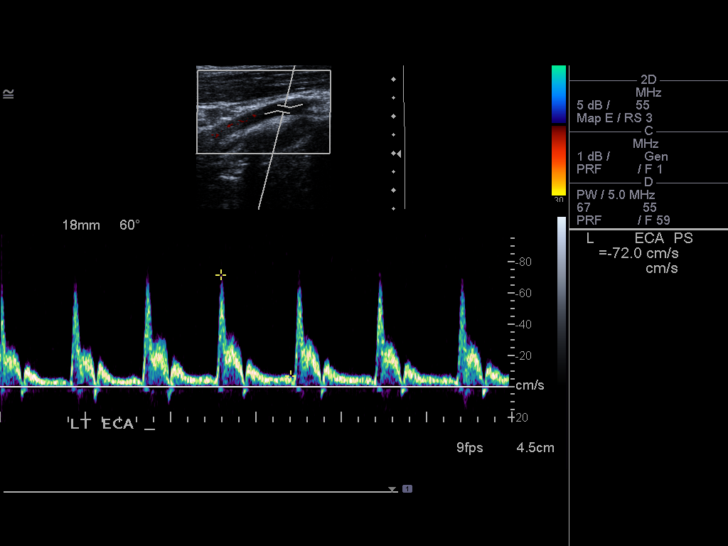
[im 49/60]
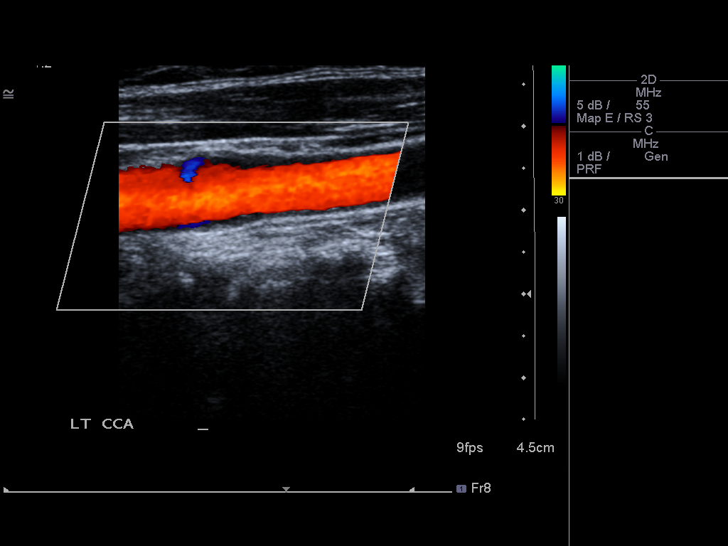
[im 54/60]
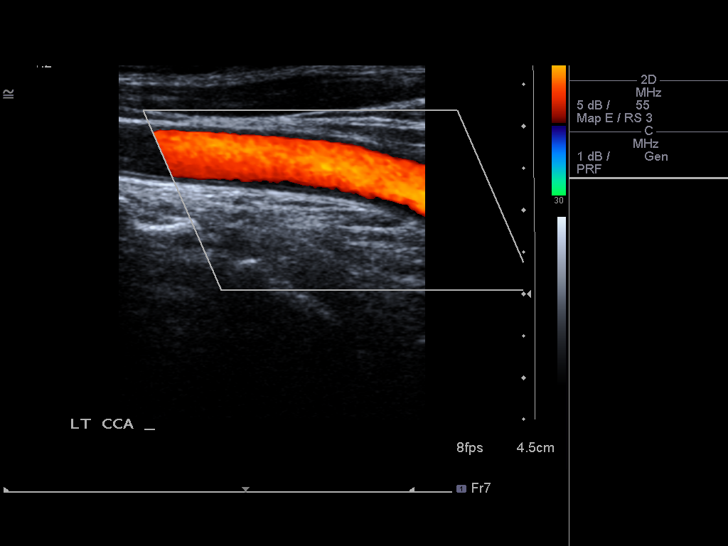
[im 60/60]
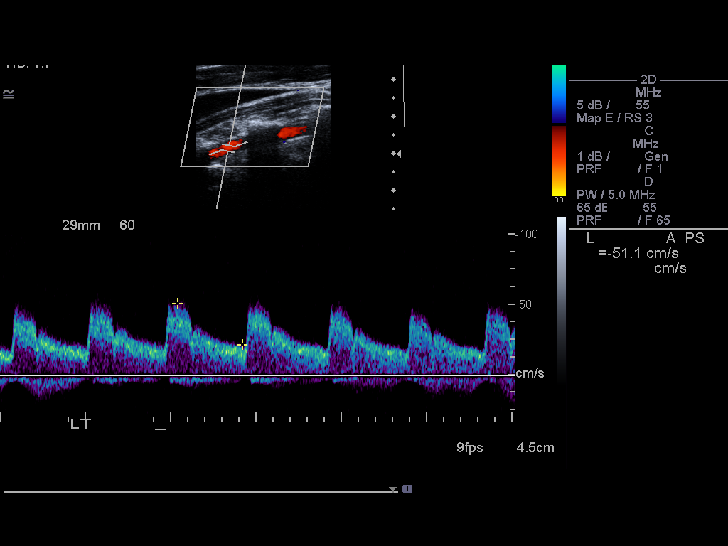

[13 of 24 positions shown; findings below may reference images not displayed]

FINDINGS: Criteria: Quantification of carotid stenosis is based on velocity
parameters that correlate the residual internal carotid diameter
with NASCET-based stenosis levels, using the diameter of the distal
internal carotid lumen as the denominator for stenosis measurement.

The following velocity measurements were obtained:

RIGHT

ICA:  119/46 cm/sec

CCA:  92/23 cm/sec

SYSTOLIC ICA/CCA RATIO:

DIASTOLIC ICA/CCA RATIO:

ECA:  84/11 cm/sec

LEFT

ICA:  100/44 cm/sec

CCA:  120/29 cm/sec

SYSTOLIC ICA/CCA RATIO:

DIASTOLIC ICA/CCA RATIO:

ECA:  72/7 cm/sec

RIGHT CAROTID ARTERY: There is no grayscale evidence of significant
intimal thickening or atherosclerotic plaque affecting the
interrogated portions of the right carotid system. There are no
elevated peak systolic velocities within the interrogated course of
the right internal carotid artery to suggest a hemodynamically
significant stenosis.

RIGHT VERTEBRAL ARTERY:  Antegrade flow

LEFT CAROTID ARTERY: There is no grayscale evidence of significant
intimal thickening or atherosclerotic plaque affecting the
interrogated portions of the left carotid system. There are no
elevated peak systolic velocities within the interrogated course of
the left internal carotid artery to suggest a hemodynamically
significant stenosis.

LEFT VERTEBRAL ARTERY:  Antegrade flow
IMPRESSION: Normal carotid Doppler ultrasound.

## 2017-02-02 ENCOUNTER — Other Ambulatory Visit: Payer: Self-pay | Admitting: Family Medicine

## 2017-02-02 DIAGNOSIS — Z1231 Encounter for screening mammogram for malignant neoplasm of breast: Secondary | ICD-10-CM

## 2017-02-05 DIAGNOSIS — H40053 Ocular hypertension, bilateral: Secondary | ICD-10-CM | POA: Diagnosis not present

## 2017-02-05 DIAGNOSIS — H40013 Open angle with borderline findings, low risk, bilateral: Secondary | ICD-10-CM | POA: Diagnosis not present

## 2017-02-05 DIAGNOSIS — Z83511 Family history of glaucoma: Secondary | ICD-10-CM | POA: Diagnosis not present

## 2017-02-19 ENCOUNTER — Ambulatory Visit
Admission: RE | Admit: 2017-02-19 | Discharge: 2017-02-19 | Disposition: A | Discharge status: Home / Self Care | Payer: PPO | Source: Ambulatory Visit | Attending: Family Medicine | Admitting: Family Medicine

## 2017-02-19 DIAGNOSIS — Z1231 Encounter for screening mammogram for malignant neoplasm of breast: Secondary | ICD-10-CM | POA: Diagnosis not present

## 2017-03-23 DIAGNOSIS — M810 Age-related osteoporosis without current pathological fracture: Secondary | ICD-10-CM | POA: Diagnosis not present

## 2017-07-06 DIAGNOSIS — S99929A Unspecified injury of unspecified foot, initial encounter: Secondary | ICD-10-CM | POA: Diagnosis not present

## 2017-07-06 DIAGNOSIS — S92911A Unspecified fracture of right toe(s), initial encounter for closed fracture: Secondary | ICD-10-CM | POA: Diagnosis not present

## 2017-07-17 DIAGNOSIS — M810 Age-related osteoporosis without current pathological fracture: Secondary | ICD-10-CM | POA: Diagnosis not present

## 2017-07-17 DIAGNOSIS — S92919A Unspecified fracture of unspecified toe(s), initial encounter for closed fracture: Secondary | ICD-10-CM | POA: Diagnosis not present

## 2017-07-17 DIAGNOSIS — K219 Gastro-esophageal reflux disease without esophagitis: Secondary | ICD-10-CM | POA: Diagnosis not present

## 2017-07-17 DIAGNOSIS — Z1159 Encounter for screening for other viral diseases: Secondary | ICD-10-CM | POA: Diagnosis not present

## 2017-07-17 DIAGNOSIS — Z8639 Personal history of other endocrine, nutritional and metabolic disease: Secondary | ICD-10-CM | POA: Diagnosis not present

## 2017-07-17 DIAGNOSIS — E785 Hyperlipidemia, unspecified: Secondary | ICD-10-CM | POA: Diagnosis not present

## 2017-07-17 DIAGNOSIS — Z0000 Encounter for general adult medical examination without abnormal findings: Secondary | ICD-10-CM | POA: Diagnosis not present

## 2017-07-17 DIAGNOSIS — M8588 Other specified disorders of bone density and structure, other site: Secondary | ICD-10-CM | POA: Diagnosis not present

## 2017-07-17 DIAGNOSIS — E559 Vitamin D deficiency, unspecified: Secondary | ICD-10-CM | POA: Diagnosis not present

## 2017-07-17 DIAGNOSIS — Z23 Encounter for immunization: Secondary | ICD-10-CM | POA: Diagnosis not present

## 2017-07-17 DIAGNOSIS — R7301 Impaired fasting glucose: Secondary | ICD-10-CM | POA: Diagnosis not present

## 2017-07-17 DIAGNOSIS — F419 Anxiety disorder, unspecified: Secondary | ICD-10-CM | POA: Diagnosis not present

## 2017-08-18 DIAGNOSIS — Z23 Encounter for immunization: Secondary | ICD-10-CM | POA: Diagnosis not present

## 2017-08-18 DIAGNOSIS — S92911A Unspecified fracture of right toe(s), initial encounter for closed fracture: Secondary | ICD-10-CM | POA: Diagnosis not present

## 2017-09-10 DIAGNOSIS — M25551 Pain in right hip: Secondary | ICD-10-CM | POA: Diagnosis not present

## 2017-09-15 ENCOUNTER — Ambulatory Visit: Payer: PPO | Attending: Family Medicine | Admitting: Physical Therapy

## 2017-09-15 ENCOUNTER — Encounter: Payer: Self-pay | Admitting: Physical Therapy

## 2017-09-15 DIAGNOSIS — R262 Difficulty in walking, not elsewhere classified: Secondary | ICD-10-CM | POA: Insufficient documentation

## 2017-09-15 DIAGNOSIS — M6281 Muscle weakness (generalized): Secondary | ICD-10-CM | POA: Insufficient documentation

## 2017-09-15 DIAGNOSIS — M545 Low back pain: Secondary | ICD-10-CM | POA: Insufficient documentation

## 2017-09-15 NOTE — Patient Instructions (Signed)
   Side Glides in Standing (Left)  SGIS Left (For left sided pain/shift correction). Stand with pain-free side about 12" away from wall with feet together and elbow bent and forearm against wall. Place opposite arm on pelvis and push toward wall and hold 2 seconds, return to starting position. Each repetition try to go further towards wall. (Note if hip is hitting wall step further way from wall). In case of lateral shift follow with repeated extension in standing.  Repeat 10x

## 2017-09-15 NOTE — Therapy (Signed)
[facility] [address] [place], [place], [address] Phone: [phone]   Fax:  [phone]  Physical Therapy Evaluation  Patient Details  Name: Kimberly Ryan MRN: [id number] Date of Birth: 1951-02-14 Referring Provider: Leighton Ruff, MD  Encounter Date: 09/15/2017      PT End of Session - 09/15/17 0945    Visit Number 1   Number of Visits 10   Date for PT Re-Evaluation 11/10/17   Authorization Type medicare gcodes needed 10 visits   PT Start Time 0934   PT Stop Time 1010   PT Time Calculation (min) 36 min   Activity Tolerance Patient tolerated treatment well   Behavior During Therapy [facility] for tasks assessed/performed      Past Medical History:  Diagnosis Date  . Anxiety   . Arthritis   . GERD (gastroesophageal reflux disease)   . Headache(784.0)   . Shortness of breath    with exertion   . Thyroid disease     Past Surgical History:  Procedure Laterality Date  . MYOMECTOMY  2000  . RHINOPLASTY  1953  . THYROID LOBECTOMY  08/17/2012   Procedure: THYROID LOBECTOMY;  Surgeon: [name], MD,FACS;  Location: [facility];  Service: General;  Laterality: Left;  Left Thyroid Lobectomy  . TONSILLECTOMY  1954    There were no vitals filed for this visit.       Subjective Assessment - 09/15/17 0937    Subjective Patient broke toe on right foot and was wearing a boot in July.  She started feeling pain in leg after 6 weeks of that.  Pain has gotten a little better.  Pain feels worse in the morning.  Pt states trying to get back into walking and feels some pain in lower back now too.     Limitations Walking   How long can you walk comfortably? 20 minutes   Patient Stated Goals be able to walk for an hour   Currently in Pain? Other (Comment)  not currently    Pain Score 0-No pain  when waking up 5/10   Pain Location Leg   Pain Orientation Right   Pain Descriptors / Indicators Sharp   Pain Type  Acute pain   Pain Radiating Towards sometimes feels back pain   Pain Onset More than a month ago   Pain Frequency Intermittent   Aggravating Factors  after waking, walking   Pain Relieving Factors resting, ibuprophen occasionally   Effect of Pain on Daily Activities exercise/walking   Multiple Pain Sites No            [facility] PT Assessment - 09/15/17 0001      Assessment   Medical Diagnosis M25.551 (ICD-10-CM) - Pain in right hip   Referring Provider Leighton Ruff, MD   Onset Date/Surgical Date 07/05/17  broke toe   Hand Dominance Right   Prior Therapy no     Precautions   Precautions None     Restrictions   Weight Bearing Restrictions No     Balance Screen   Has the patient fallen in the past 6 months Yes   How many times? 1   Has the patient had a decrease in activity level because of a fear of falling?  No   Is the patient reluctant to leave their home because of a fear of falling?  No     Home Social worker Private residence   Living Arrangements Spouse/significant other;Alone  husbnad  away during the week     Prior Function   Level of Independence Independent   Vocation Retired     Associate Professor   Overall Cognitive Status Within Functional Limits for tasks assessed     Observation/Other Assessments   Focus on Therapeutic Outcomes (FOTO)  34% limited     Posture/Postural Control   Posture/Postural Control Postural limitations   Postural Limitations Weight shift left;Rounded Shoulders     ROM / Strength   AROM / PROM / Strength Strength;PROM     PROM   Overall PROM  Within functional limits for tasks performed     Strength   Strength Assessment Site Hip   Right Hip External Rotation  4/5   Right Hip ABduction 4/5   Right Hip ADduction 4/5   Left Hip ABduction 4+/5   Left Hip ADduction 4+/5     Palpation   Palpation comment right glutes tight and tender     Special Tests    Special Tests Lumbar   Lumbar Tests Straight Leg  Raise     Straight Leg Raise   Findings Negative     Ambulation/Gait   Gait Pattern Trendelenburg;Lateral hip instability            Objective measurements completed on examination: See above findings.                  PT Education - 09/15/17 1339    Education provided Yes   Education Details lateral shift   Person(s) Educated Patient   Methods Explanation;Demonstration;Handout;Verbal cues   Comprehension Verbalized understanding;Returned demonstration          PT Short Term Goals - 09/15/17 1013      PT SHORT TERM GOAL #1   Title pt will demonstrate decreased trendelenburg on right side due to increased hip strength   Time 4   Period Weeks   Status New   Target Date 10/13/17     PT SHORT TERM GOAL #2   Title independent with initial HEP   Time 4   Period Weeks   Status New   Target Date 10/13/17     PT SHORT TERM GOAL #3   Title hip abduction right LE increased to 4+/5 for improved posture   Time 4   Period Weeks   Status New   Target Date 10/13/17           PT Long Term Goals - 09/15/17 1339      PT LONG TERM GOAL #1   Title pt will report 75% less pain when walking in order to return to regular walking routine   Time 8   Period Weeks   Status New   Target Date 11/10/17     PT LONG TERM GOAL #2   Title able to walk for 60 minutes with only minimal pain/discomfort.   Time 8   Period Weeks   Status New   Target Date 11/10/17     PT LONG TERM GOAL #3   Title demonstrates no Trendelenburg with gait due to improved hip strength   Time 8   Period Weeks   Status New   Target Date 11/10/17     PT LONG TERM GOAL #4   Title ind with advanced HEP   Time 8   Period Weeks   Status New   Target Date 11/10/17                Plan - 09/15/17 1341    Clinical  Impression Statement Pt presents to clinic after having a broken toe in July and walking in boot caused her to have onset of right leg pain.  She has started walking  again and is having some low back pain as well. Pt demonstrates core and hip weakness.  She has Trendelenburg on right side during gait assessment.  Pt has lateral lean to the left side when standing.  She has tenderness and tight glutes on right side as well.  Pt will benefit from skilled PT to address these impairments and return to function and activities as part of healthy lifestyle.   Clinical Presentation Stable   Clinical Presentation due to: pt is stable    Clinical Decision Making Low   Rehab Potential Excellent   PT Frequency 2x / week   PT Duration 8 weeks   PT Treatment/Interventions ADLs/Self Care Home Management;Biofeedback;Cryotherapy;Electrical Stimulation;Iontophoresis 4mg /ml Dexamethasone;Moist Heat;Traction;Ultrasound;Gait training;Stair training;Functional mobility training;Therapeutic activities;Therapeutic exercise;Balance training;Neuromuscular re-education;Passive range of motion;Manual techniques;Patient/family education;Dry needling;Taping   PT Next Visit Plan psoture, hip and core strengthening, manual to glutes as needed, lumbar and hip stretches   PT Home Exercise Plan progress as needed   Recommended Other Services n/a   Consulted and Agree with Plan of Care Patient      Patient will benefit from skilled therapeutic intervention in order to improve the following deficits and impairments:  Abnormal gait, Decreased activity tolerance, Decreased strength, Difficulty walking, Postural dysfunction, Pain  Visit Diagnosis: Difficulty in walking, not elsewhere classified  Acute low back pain, unspecified back pain laterality, with sciatica presence unspecified  Muscle weakness (generalized)      G-Codes - 2017-09-16 1009    Functional Assessment Tool Used (Outpatient Only) FOTO and clinical judgement   Functional Limitation Mobility: Walking and moving around   Mobility: Walking and Moving Around Current Status [phone]) At least 20 percent but less than 40 percent  impaired, limited or restricted   Mobility: Walking and Moving Around Goal Status [phone]) At least 20 percent but less than 40 percent impaired, limited or restricted       Problem List Patient Active Problem List   Diagnosis Date Noted  . Postsurgical hypothyroidism 11/04/2012  . Multiple thyroid nodules 12/19/2011    Zannie Cove, PT 2017/09/16, 1:54 PM  [facility] [address], [place], [place], [address] Phone: [phone]   Fax:  [phone]  Name: Kimberly Ryan MRN: [id number] Date of Birth: 07/12/1951

## 2017-09-16 DIAGNOSIS — D239 Other benign neoplasm of skin, unspecified: Secondary | ICD-10-CM | POA: Diagnosis not present

## 2017-09-16 DIAGNOSIS — L821 Other seborrheic keratosis: Secondary | ICD-10-CM | POA: Diagnosis not present

## 2017-09-16 DIAGNOSIS — Z23 Encounter for immunization: Secondary | ICD-10-CM | POA: Diagnosis not present

## 2017-09-16 DIAGNOSIS — L918 Other hypertrophic disorders of the skin: Secondary | ICD-10-CM | POA: Diagnosis not present

## 2017-09-16 DIAGNOSIS — T451X5A Adverse effect of antineoplastic and immunosuppressive drugs, initial encounter: Secondary | ICD-10-CM | POA: Diagnosis not present

## 2017-09-16 DIAGNOSIS — L900 Lichen sclerosus et atrophicus: Secondary | ICD-10-CM | POA: Diagnosis not present

## 2017-09-16 DIAGNOSIS — Z808 Family history of malignant neoplasm of other organs or systems: Secondary | ICD-10-CM | POA: Diagnosis not present

## 2017-09-16 DIAGNOSIS — L570 Actinic keratosis: Secondary | ICD-10-CM | POA: Diagnosis not present

## 2017-09-17 ENCOUNTER — Ambulatory Visit: Payer: PPO | Admitting: Physical Therapy

## 2017-09-17 DIAGNOSIS — M6281 Muscle weakness (generalized): Secondary | ICD-10-CM

## 2017-09-17 DIAGNOSIS — M545 Low back pain: Secondary | ICD-10-CM

## 2017-09-17 DIAGNOSIS — R262 Difficulty in walking, not elsewhere classified: Secondary | ICD-10-CM

## 2017-09-17 NOTE — Therapy (Signed)
[facility] [address] [place], [place], [address] Phone: [phone]   Fax:  [phone]  Physical Therapy Treatment  Patient Details  Name: Kimberly Ryan MRN: [id number] Date of Birth: 10-19-1951 Referring Provider: Leighton Ruff, MD  Encounter Date: 09/17/2017      PT End of Session - 09/17/17 0939    Visit Number 2   Number of Visits 10   Date for PT Re-Evaluation 11/10/17   Authorization Type medicare gcodes needed 10 visits   PT Start Time 0933   PT Stop Time 1012   PT Time Calculation (min) 39 min   Activity Tolerance Patient tolerated treatment well   Behavior During Therapy [facility] for tasks assessed/performed      Past Medical History:  Diagnosis Date  . Anxiety   . Arthritis   . GERD (gastroesophageal reflux disease)   . Headache(784.0)   . Shortness of breath    with exertion   . Thyroid disease     Past Surgical History:  Procedure Laterality Date  . MYOMECTOMY  2000  . RHINOPLASTY  1953  . THYROID LOBECTOMY  08/17/2012   Procedure: THYROID LOBECTOMY;  Surgeon: [name], MD,FACS;  Location: [facility];  Service: General;  Laterality: Left;  Left Thyroid Lobectomy  . TONSILLECTOMY  1954    There were no vitals filed for this visit.      Subjective Assessment - 09/17/17 0940    Subjective Patient states the morning is still the worst in the leg.  Having some mild pain in low back when walking, but denies pain when sitting on bike this AM.   Limitations Walking   How long can you walk comfortably? 20 minutes   Patient Stated Goals be able to walk for an hour   Currently in Pain? No/denies                         [facility] Adult PT Treatment/Exercise - 09/17/17 0001      Neuro Re-ed    Neuro Re-ed Details  emphasized TrA engaging throughout all exercises - verbal and tactile cues     Knee/Hip Exercises: Stretches   Active Hamstring Stretch Right;Left;3  reps;20 seconds   Hip Flexor Stretch Right;3 reps;20 seconds  supine     Knee/Hip Exercises: Aerobic   Stationary Bike L1 x 4 min, L2 x 2 min     Knee/Hip Exercises: Seated   Long Arc Quad Strengthening;Right;Left;2 sets;10 reps;Weights   Long Arc Quad Weight 3 lbs.   Long [employer] Limitations focus on core stability   Marching Strengthening;Both;20 reps;Weights   Marching Limitations focus on core stability   Marching Weights 3 lbs.     Knee/Hip Exercises: Supine   Bridges Strengthening;2 sets;10 reps   Straight Leg Raises Both;10 reps   Other Supine Knee/Hip Exercises clam 3 x 10 green band     Knee/Hip Exercises: Sidelying   Clams 3 x 10                 PT Education - 09/17/17 1015    Education provided Yes   Education Details HEP   Person(s) Educated Patient   Methods Explanation;Demonstration;Handout;Verbal cues;Tactile cues   Comprehension Verbalized understanding;Returned demonstration          PT Short Term Goals - 09/17/17 0939      PT SHORT TERM GOAL #1   Title pt will demonstrate decreased trendelenburg on right side due  to increased hip strength   Time 4   Period Weeks   Status On-going     PT SHORT TERM GOAL #2   Title independent with initial HEP   Time 4   Period Weeks   Status On-going     PT SHORT TERM GOAL #3   Title hip abduction right LE increased to 4+/5 for improved posture   Time 4   Period Weeks   Status On-going           PT Long Term Goals - 09/15/17 1339      PT LONG TERM GOAL #1   Title pt will report 75% less pain when walking in order to return to regular walking routine   Time 8   Period Weeks   Status New   Target Date 11/10/17     PT LONG TERM GOAL #2   Title able to walk for 60 minutes with only minimal pain/discomfort.   Time 8   Period Weeks   Status New   Target Date 11/10/17     PT LONG TERM GOAL #3   Title demonstrates no Trendelenburg with gait due to improved hip strength   Time 8   Period  Weeks   Status New   Target Date 11/10/17     PT LONG TERM GOAL #4   Title ind with advanced HEP   Time 8   Period Weeks   Status New   Target Date 11/10/17               Plan - 09/17/17 0258    Clinical Impression Statement No goals met at this time due to initial treatment since eval.  Pt performed exercises correctly and able to add to initial HEP for working on hip strength.  Pt will benefit from skilled PT for strength and improved gait.   Rehab Potential Excellent   PT Treatment/Interventions ADLs/Self Care Home Management;Biofeedback;Cryotherapy;Electrical Stimulation;Iontophoresis '4mg'$ /ml Dexamethasone;Moist Heat;Traction;Ultrasound;Gait training;Stair training;Functional mobility training;Therapeutic activities;Therapeutic exercise;Balance training;Neuromuscular re-education;Passive range of motion;Manual techniques;Patient/family education;Dry needling;Taping   PT Next Visit Plan psoture, hip and core strengthening, manual to glutes as needed, lumbar and hip stretches   Consulted and Agree with Plan of Care Patient      Patient will benefit from skilled therapeutic intervention in order to improve the following deficits and impairments:  Abnormal gait, Decreased activity tolerance, Decreased strength, Difficulty walking, Postural dysfunction, Pain  Visit Diagnosis: Difficulty in walking, not elsewhere classified  Acute low back pain, unspecified back pain laterality, with sciatica presence unspecified  Muscle weakness (generalized)     Problem List Patient Active Problem List   Diagnosis Date Noted  . Postsurgical hypothyroidism 11/04/2012  . Multiple thyroid nodules 12/19/2011    Zannie Cove, PT 09/17/2017, 12:10 PM  [facility] [address], [place], [place], [address] Phone: [phone]   Fax:  [phone]  Name: MELODYE SWOR MRN: [id number] Date of Birth: 1951/06/08

## 2017-09-17 NOTE — Patient Instructions (Signed)
    BRIDGING  While lying on your back, tighten your lower abdominals, squeeze your buttocks and then raise your buttocks off the floor/bed as creating a "Bridge" with your body. Make sure to press both heels evenly into the mat.  Hold and then lower yourself and repeat 20x     STRAIGHT LEG RAISE - SLR  While lying on your back, raise up your leg with a straight knee.  Keep the opposite knee bent with the foot planted on the ground. Do 2 sets of 10      SIDELYING [place]HE[place]  While lying on your side with your knees bent, draw up the top knee while keeping contact of your feet together.  Do not let your pelvis roll back during the lifting movement.  Do 3 sets of [place] 2  While lying on a table or high bed, let the affected leg lower towards the floor until a stretch is felt along the front of your thigh.   At the same time, grasp your opposite knee and pull it towards your chest.  You can also rest the opposite leg on the mat  Hold 30 sec, repeat 3x

## 2017-09-22 ENCOUNTER — Ambulatory Visit: Payer: PPO | Admitting: Physical Therapy

## 2017-09-22 DIAGNOSIS — R262 Difficulty in walking, not elsewhere classified: Secondary | ICD-10-CM

## 2017-09-22 DIAGNOSIS — M6281 Muscle weakness (generalized): Secondary | ICD-10-CM

## 2017-09-22 DIAGNOSIS — M545 Low back pain: Secondary | ICD-10-CM

## 2017-09-22 NOTE — Therapy (Signed)
[facility] [address] [place], [place], [address] Phone: [phone]   Fax:  [phone]  Physical Therapy Treatment  Patient Details  Name: Kimberly Ryan MRN: [id number] Date of Birth: [birth year] Referring Provider: Leighton Ruff, MD  Encounter Date: 09/22/2017      PT End of Session - 09/22/17 0940    Visit Number 3   Number of Visits 10   Date for PT Re-Evaluation 11/10/17   Authorization Type medicare gcodes needed 10 visits   PT Start Time 0935   PT Stop Time 1013   PT Time Calculation (min) 38 min   Activity Tolerance Patient tolerated treatment well   Behavior During Therapy [facility] for tasks assessed/performed      Past Medical History:  Diagnosis Date  . Anxiety   . Arthritis   . GERD (gastroesophageal reflux disease)   . Headache(784.0)   . Shortness of breath    with exertion   . Thyroid disease     Past Surgical History:  Procedure Laterality Date  . MYOMECTOMY  2000  . RHINOPLASTY  1953  . THYROID LOBECTOMY  08/17/2012   Procedure: THYROID LOBECTOMY;  Surgeon: [name], MD,FACS;  Location: [facility];  Service: General;  Laterality: Left;  Left Thyroid Lobectomy  . TONSILLECTOMY  1954    There were no vitals filed for this visit.      Subjective Assessment - 09/22/17 0942    Subjective My knee was swelling and sore and had some nerve pain down the front of lower leg.  I had that nerve feeling this morning.  Using ice and ibuprophen.   Limitations Walking   How long can you walk comfortably? 20 minutes   Patient Stated Goals be able to walk for an hour   Currently in Pain? No/denies                         [facility] Adult PT Treatment/Exercise - 09/22/17 0001      Knee/Hip Exercises: Stretches   Active Hamstring Stretch Right;Left;3 reps;20 seconds   Other Knee/Hip Stretches adductor and TFL/IT band stretch with strap     Knee/Hip Exercises: Aerobic   Nustep L1 x 6 min     Knee/Hip Exercises: Supine   Other Supine Knee/Hip Exercises leg lengthener x 3 each side   Other Supine Knee/Hip Exercises LE marching with abdominal bracing - 20x     Knee/Hip Exercises: Sidelying   Clams 20x bilat                PT Education - 09/22/17 1013    Education provided Yes   Education Details hip stretches   Person(s) Educated Patient   Methods Explanation;Demonstration;Handout;Verbal cues;Tactile cues   Comprehension Verbalized understanding;Returned demonstration          PT Short Term Goals - 09/22/17 0946      PT SHORT TERM GOAL #1   Title pt will demonstrate decreased trendelenburg on right side due to increased hip strength   Time 4   Period Weeks   Status On-going     PT SHORT TERM GOAL #2   Title independent with initial HEP   Time 4   Period Weeks   Status On-going     PT SHORT TERM GOAL #3   Title hip abduction right LE increased to 4+/5 for improved posture   Time 4   Period Weeks   Status On-going  PT Long Term Goals - 09/15/17 1339      PT LONG TERM GOAL #1   Title pt will report 75% less pain when walking in order to return to regular walking routine   Time 8   Period Weeks   Status New   Target Date 11/10/17     PT LONG TERM GOAL #2   Title able to walk for 60 minutes with only minimal pain/discomfort.   Time 8   Period Weeks   Status New   Target Date 11/10/17     PT LONG TERM GOAL #3   Title demonstrates no Trendelenburg with gait due to improved hip strength   Time 8   Period Weeks   Status New   Target Date 11/10/17     PT LONG TERM GOAL #4   Title ind with advanced HEP   Time 8   Period Weeks   Status New   Target Date 11/10/17               Plan - 09/22/17 0944    Clinical Impression Statement Patient was educated in stretches added to HEP. She was alble to perform exercises wihtout pain today.  She has difficulty stabilizing pelvis with abdominal bracing and  needs to continue working on strengthening.  Pt benefits from skilled PT to work on strength so she can return to walking routine without increased pain.    Rehab Potential Excellent   PT Treatment/Interventions ADLs/Self Care Home Management;Biofeedback;Cryotherapy;Electrical Stimulation;Iontophoresis 4mg /ml Dexamethasone;Moist Heat;Traction;Ultrasound;Gait training;Stair training;Functional mobility training;Therapeutic activities;Therapeutic exercise;Balance training;Neuromuscular re-education;Passive range of motion;Manual techniques;Patient/family education;Dry needling;Taping   PT Next Visit Plan psoture, hip and core strengthening, manual to glutes as needed, lumbar and hip stretches   PT Home Exercise Plan progress as needed   Consulted and Agree with Plan of Care Patient      Patient will benefit from skilled therapeutic intervention in order to improve the following deficits and impairments:  Abnormal gait, Decreased activity tolerance, Decreased strength, Difficulty walking, Postural dysfunction, Pain  Visit Diagnosis: Difficulty in walking, not elsewhere classified  Acute low back pain, unspecified back pain laterality, with sciatica presence unspecified  Muscle weakness (generalized)     Problem List Patient Active Problem List   Diagnosis Date Noted  . Postsurgical hypothyroidism 11/04/2012  . Multiple thyroid nodules 12/19/2011    Kimberly Ryan 09/22/2017, 10:16 AM  [facility] [address], [place], [place], [address] Phone: [phone]   Fax:  [phone]  Name: Kimberly Ryan MRN: [id number] Date of Birth: Jan 13, 1951

## 2017-09-22 NOTE — Patient Instructions (Signed)
   HAMSTRING STRETCH WITH TOWEL  While lying down on your back, hook a towel or strap under  your foot and draw up your leg until a stretch is felt under your leg. calf area.  Keep your knee in a straightened position during the stretch. Hold 30 sec x 3    ADDUCTOR STRETCH WITH MULTI-LOOP STRAP  Lie on your back and place a stretching strap on your foot. Pull on the strap to assist in raising your leg up and to the side for a stretch to your inner thigh muscles. Hold 30 sec x 3    ILIOTIBIAL BAND STRETCH WITH MULTI-LOOP STRAP - ITB  Place a strap around your foot. While lying on your back and leg up in front of you and knee straight, bring your leg across midline for a gentle stretch felt along your outer thigh. Hold 30 sec x 3

## 2017-09-25 ENCOUNTER — Encounter: Payer: PPO | Admitting: Physical Therapy

## 2017-09-28 ENCOUNTER — Ambulatory Visit: Payer: PPO | Admitting: Physical Therapy

## 2017-09-29 ENCOUNTER — Encounter: Payer: PPO | Admitting: Physical Therapy

## 2017-10-13 ENCOUNTER — Encounter: Payer: PPO | Admitting: Physical Therapy

## 2017-10-16 ENCOUNTER — Ambulatory Visit: Payer: PPO | Attending: Family Medicine | Admitting: Physical Therapy

## 2017-10-16 ENCOUNTER — Encounter: Payer: Self-pay | Admitting: Physical Therapy

## 2017-10-16 DIAGNOSIS — M545 Low back pain: Secondary | ICD-10-CM

## 2017-10-16 DIAGNOSIS — M6281 Muscle weakness (generalized): Secondary | ICD-10-CM | POA: Diagnosis not present

## 2017-10-16 DIAGNOSIS — R262 Difficulty in walking, not elsewhere classified: Secondary | ICD-10-CM | POA: Diagnosis not present

## 2017-10-16 NOTE — Patient Instructions (Signed)
   HIP ABDUCTION - SIDELYING  While lying on your side, slowly raise up your top leg to the side. Keep your knee straight and maintain your toes pointed forward the entire time. Keep your leg in-line with your body.  The bottom leg can be bent to stabilize your body. Do 2 sets of 10 reps/ day

## 2017-10-16 NOTE — Therapy (Signed)
[facility] [address], [place], [place], [address] Phone: [phone]   Fax:  [phone]  Physical Therapy Treatment  Patient Details  Name: Kimberly Ryan MRN: [id number] Date of Birth: [birth year] Referring Provider: Leighton Ruff, MD  Encounter Date: 10/16/2017      PT End of Session - 10/16/17 1024    Visit Number 4   Number of Visits 10   Date for PT Re-Evaluation 11/10/17   Authorization Type medicare gcodes needed 10 visits   PT Start Time 1020   PT Stop Time 1059   PT Time Calculation (min) 39 min   Activity Tolerance Patient tolerated treatment well   Behavior During Therapy [facility] for tasks assessed/performed      Past Medical History:  Diagnosis Date  . Anxiety   . Arthritis   . GERD (gastroesophageal reflux disease)   . Headache(784.0)   . Shortness of breath    with exertion   . Thyroid disease     Past Surgical History:  Procedure Laterality Date  . MYOMECTOMY  2000  . RHINOPLASTY  1953  . THYROID LOBECTOMY  08/17/2012   Procedure: THYROID LOBECTOMY;  Surgeon: [name], MD,FACS;  Location: [facility];  Service: General;  Laterality: Left;  Left Thyroid Lobectomy  . TONSILLECTOMY  1954    There were no vitals filed for this visit.      Subjective Assessment - 10/16/17 1021    Subjective I did a lot of walking in [place].  I didn't have as much stamina.  I was mostly fine but I have some cramping and some tingling down the front of the leg.  I had pain this morning when getting up to go to the bathroom.  Denies pain currently.   Limitations Walking   Patient Stated Goals be able to walk for an hour   Currently in Pain? No/denies                         [facility] Adult PT Treatment/Exercise - 10/16/17 0001      Therapeutic Activites    Therapeutic Activities ADL's   ADL's walking with glute squeeze and preventing hip drop on right     Neuro Re-ed    Neuro  Re-ed Details  glute med and core activation with ambulation and exercises     Knee/Hip Exercises: Aerobic   Stationary Bike L2 x 6 min     Knee/Hip Exercises: Standing   Other Standing Knee Exercises standing glute med contraction on right - left side lifting - 5x     Knee/Hip Exercises: Sidelying   Hip ABduction Strengthening;Right;2 sets;10 reps   Clams 20x bilat     Manual Therapy   Manual Therapy Joint mobilization   Joint Mobilization rotation L2/L3                PT Education - 10/16/17 1054    Education provided Yes   Education Details hip abduction   Person(s) Educated Patient   Methods Explanation;Demonstration;Handout   Comprehension Verbalized understanding;Returned demonstration          PT Short Term Goals - 09/22/17 0946      PT SHORT TERM GOAL #1   Title pt will demonstrate decreased trendelenburg on right side due to increased hip strength   Time 4   Period Weeks   Status On-going     PT SHORT TERM GOAL #2   Title independent with initial  HEP   Time 4   Period Weeks   Status On-going     PT SHORT TERM GOAL #3   Title hip abduction right LE increased to 4+/5 for improved posture   Time 4   Period Weeks   Status On-going           PT Long Term Goals - 09/15/17 1339      PT LONG TERM GOAL #1   Title pt will report 75% less pain when walking in order to return to regular walking routine   Time 8   Period Weeks   Status New   Target Date 11/10/17     PT LONG TERM GOAL #2   Title able to walk for 60 minutes with only minimal pain/discomfort.   Time 8   Period Weeks   Status New   Target Date 11/10/17     PT LONG TERM GOAL #3   Title demonstrates no Trendelenburg with gait due to improved hip strength   Time 8   Period Weeks   Status New   Target Date 11/10/17     PT LONG TERM GOAL #4   Title ind with advanced HEP   Time 8   Period Weeks   Status New   Target Date 11/10/17               Plan - 10/16/17 1102     Clinical Impression Statement Patient had positive prone knee flexion test on left.  Tested negative after rotation mobs to L2/L3.  Pt continues to ambulate with trendelenburg on right side.  She reports she has not had time to do the exercises due to being away which may be impacting her results with PT at this time.  She was educated on importance of doing exercises daily.  She will benefit from skilled PT to work on core and hip strength for improved gait.    PT Treatment/Interventions ADLs/Self Care Home Management;Biofeedback;Cryotherapy;Electrical Stimulation;Iontophoresis 4mg /ml Dexamethasone;Moist Heat;Traction;Ultrasound;Gait training;Stair training;Functional mobility training;Therapeutic activities;Therapeutic exercise;Balance training;Neuromuscular re-education;Passive range of motion;Manual techniques;Patient/family education;Dry needling;Taping   PT Next Visit Plan psoture, hip and core strengthening, manual to glutes as needed, lumbar and hip stretches   PT Home Exercise Plan progress as needed   Consulted and Agree with Plan of Care Patient      Patient will benefit from skilled therapeutic intervention in order to improve the following deficits and impairments:  Abnormal gait, Decreased activity tolerance, Decreased strength, Difficulty walking, Postural dysfunction, Pain  Visit Diagnosis: Difficulty in walking, not elsewhere classified  Acute low back pain, unspecified back pain laterality, with sciatica presence unspecified  Muscle weakness (generalized)     Problem List Patient Active Problem List   Diagnosis Date Noted  . Postsurgical hypothyroidism 11/04/2012  . Multiple thyroid nodules 12/19/2011    Zannie Cove, PT 10/16/2017, 12:04 PM  [facility] [address], [place], [place], [address] Phone: [phone]   Fax:  [phone]  Name: Kimberly Ryan MRN: [id number] Date of Birth:  Apr 23, 1951

## 2017-10-23 ENCOUNTER — Ambulatory Visit: Payer: PPO | Admitting: Physical Therapy

## 2017-10-23 ENCOUNTER — Encounter: Payer: Self-pay | Admitting: Physical Therapy

## 2017-10-23 DIAGNOSIS — R262 Difficulty in walking, not elsewhere classified: Secondary | ICD-10-CM

## 2017-10-23 DIAGNOSIS — M545 Low back pain: Secondary | ICD-10-CM

## 2017-10-23 DIAGNOSIS — M6281 Muscle weakness (generalized): Secondary | ICD-10-CM

## 2017-10-23 NOTE — Patient Instructions (Signed)

## 2017-10-23 NOTE — Therapy (Addendum)
[facility] [address], [place], [place], [address] Phone: [phone]   Fax:  [phone]  Physical Therapy Treatment  Patient Details  Name: Kimberly Ryan MRN: [id number] Date of Birth: [birth year] Referring Provider: Leighton Ruff, MD   Encounter Date: 10/23/2017  PT End of Session - 10/23/17 1111    Visit Number  5    Number of Visits  10    Date for PT Re-Evaluation  11/10/17    Authorization Type  medicare gcodes needed 10 visits    PT Start Time  1107    PT Stop Time  1147    PT Time Calculation (min)  40 min    Activity Tolerance  Patient tolerated treatment well    Behavior During Therapy  [facility] for tasks assessed/performed       Past Medical History:  Diagnosis Date  . Anxiety   . Arthritis   . GERD (gastroesophageal reflux disease)   . Headache(784.0)   . Shortness of breath    with exertion   . Thyroid disease     Past Surgical History:  Procedure Laterality Date  . MYOMECTOMY  2000  . RHINOPLASTY  1953  . TONSILLECTOMY  1954    There were no vitals filed for this visit.  Subjective Assessment - 10/23/17 1113    Subjective  I went on three pretty long walks which seemed to aggravate things, 40 min and 22 min.  I have been busy and have not done my exercises.  I did feel a lot better after what we did during the previous treatment.      Limitations  Walking    Patient Stated Goals  be able to walk for an hour    Currently in Pain?  No/denies                      [facility] Adult PT Treatment/Exercise - 10/23/17 0001      Neuro Re-ed    Neuro Re-ed Details   educated and perfomred transverse abdominal contraction to work on greater control of lumbar spine during functional movements      Knee/Hip Exercises: Aerobic   Stationary Bike  L2 x 8 min      Knee/Hip Exercises: Standing   Other Standing Knee Exercises  standing glute med contraction on right - left side  lifting - 5x feeling knee pain, difficulty contracting glutes in SLS      Knee/Hip Exercises: Sidelying   Clams  20x right side only      Manual Therapy   Manual Therapy  Soft tissue mobilization    Joint Mobilization  rotation L2/L3 in right side lying    Soft tissue mobilization  QL STM             PT Education - 10/23/17 1139    Education provided  Yes    Education Details  transverse abdominal engaging    Person(s) Educated  Patient    Methods  Explanation;Demonstration;Tactile cues;Verbal cues;Handout    Comprehension  Verbalized understanding;Returned demonstration       PT Short Term Goals - 09/22/17 0946      PT SHORT TERM GOAL #1   Title  pt will demonstrate decreased trendelenburg on right side due to increased hip strength    Time  4    Period  Weeks    Status  On-going      PT SHORT TERM GOAL #2   Title  independent with initial HEP    Time  4    Period  Weeks    Status  On-going      PT SHORT TERM GOAL #3   Title  hip abduction right LE increased to 4+/5 for improved posture    Time  4    Period  Weeks    Status  On-going        PT Long Term Goals - 09/15/17 1339      PT LONG TERM GOAL #1   Title  pt will report 75% less pain when walking in order to return to regular walking routine    Time  8    Period  Weeks    Status  New    Target Date  11/10/17      PT LONG TERM GOAL #2   Title  able to walk for 60 minutes with only minimal pain/discomfort.    Time  8    Period  Weeks    Status  New    Target Date  11/10/17      PT LONG TERM GOAL #3   Title  demonstrates no Trendelenburg with gait due to improved hip strength    Time  8    Period  Weeks    Status  New    Target Date  11/10/17      PT LONG TERM GOAL #4   Title  ind with advanced HEP    Time  8    Period  Weeks    Status  New    Target Date  11/10/17            Plan - 10/23/17 1112    Clinical Impression Statement  Patient had positive PKF test on left again  today and responded well to manual.  She was educated on activating TrA in order to stabilize lumbar spine and maintain improved alignment during functional activities.      Rehab Potential  Excellent    PT Treatment/Interventions  ADLs/Self Care Home Management;Biofeedback;Cryotherapy;Electrical Stimulation;Iontophoresis 58m/ml Dexamethasone;Moist Heat;Traction;Ultrasound;Gait training;Stair training;Functional mobility training;Therapeutic activities;Therapeutic exercise;Balance training;Neuromuscular re-education;Passive range of motion;Manual techniques;Patient/family education;Dry needling;Taping    PT Next Visit Plan  posture, hip and core strengthening, manual to glutes as needed, lumbar and hip stretches    Consulted and Agree with Plan of Care  Patient       Patient will benefit from skilled therapeutic intervention in order to improve the following deficits and impairments:  Abnormal gait, Decreased activity tolerance, Decreased strength, Difficulty walking, Postural dysfunction, Pain  Visit Diagnosis: Difficulty in walking, not elsewhere classified  Acute low back pain, unspecified back pain laterality, with sciatica presence unspecified  Muscle weakness (generalized)  Gcodes: clinical assessment Current CJ  Goal CJ Discharge  CJ   Problem List Patient Active Problem List   Diagnosis Date Noted  . Postsurgical hypothyroidism 11/04/2012  . Multiple thyroid nodules 12/19/2011    JZannie Cove PT 10/23/2017, 11:48 AM  [facility] [address] [place] [address]Phone: [phone]  Fax:  [phone] Name: Kimberly DAILMRN: [id number] of Birth: [birth year] PHYSICAL THERAPY DISCHARGE SUMMARY  Visits from Start of Care: 5  Current functional level related to goals / functional outcomes: See above goals   Remaining deficits: See above   Education / Equipment: HEP   Plan: Patient agrees to discharge.  Patient goals were not met. Patient is being discharged due to being pleased  with the current functional level.  ?????         Pt called to clinic reporting she is feeling better  Zannie Cove, PT 11/09/17 10:55 AM

## 2017-10-29 DIAGNOSIS — M810 Age-related osteoporosis without current pathological fracture: Secondary | ICD-10-CM | POA: Diagnosis not present

## 2017-10-29 DIAGNOSIS — E559 Vitamin D deficiency, unspecified: Secondary | ICD-10-CM | POA: Diagnosis not present

## 2017-10-29 DIAGNOSIS — E890 Postprocedural hypothyroidism: Secondary | ICD-10-CM | POA: Diagnosis not present

## 2017-10-30 ENCOUNTER — Encounter: Payer: PPO | Admitting: Physical Therapy

## 2017-11-09 ENCOUNTER — Encounter: Payer: PPO | Admitting: Physical Therapy

## 2017-12-25 DIAGNOSIS — E559 Vitamin D deficiency, unspecified: Secondary | ICD-10-CM | POA: Diagnosis not present

## 2018-01-15 DIAGNOSIS — H40013 Open angle with borderline findings, low risk, bilateral: Secondary | ICD-10-CM | POA: Diagnosis not present

## 2018-01-15 DIAGNOSIS — Z83511 Family history of glaucoma: Secondary | ICD-10-CM | POA: Diagnosis not present

## 2018-02-05 DIAGNOSIS — M545 Low back pain: Secondary | ICD-10-CM | POA: Diagnosis not present

## 2018-02-05 DIAGNOSIS — R262 Difficulty in walking, not elsewhere classified: Secondary | ICD-10-CM | POA: Diagnosis not present

## 2018-02-05 DIAGNOSIS — M25551 Pain in right hip: Secondary | ICD-10-CM | POA: Diagnosis not present

## 2018-02-05 DIAGNOSIS — M6281 Muscle weakness (generalized): Secondary | ICD-10-CM | POA: Diagnosis not present

## 2018-02-08 DIAGNOSIS — M25551 Pain in right hip: Secondary | ICD-10-CM | POA: Diagnosis not present

## 2018-02-08 DIAGNOSIS — R262 Difficulty in walking, not elsewhere classified: Secondary | ICD-10-CM | POA: Diagnosis not present

## 2018-02-08 DIAGNOSIS — M545 Low back pain: Secondary | ICD-10-CM | POA: Diagnosis not present

## 2018-02-08 DIAGNOSIS — M6281 Muscle weakness (generalized): Secondary | ICD-10-CM | POA: Diagnosis not present

## 2018-02-15 DIAGNOSIS — M25551 Pain in right hip: Secondary | ICD-10-CM | POA: Diagnosis not present

## 2018-02-15 DIAGNOSIS — M6281 Muscle weakness (generalized): Secondary | ICD-10-CM | POA: Diagnosis not present

## 2018-02-15 DIAGNOSIS — R262 Difficulty in walking, not elsewhere classified: Secondary | ICD-10-CM | POA: Diagnosis not present

## 2018-02-15 DIAGNOSIS — M545 Low back pain: Secondary | ICD-10-CM | POA: Diagnosis not present

## 2018-02-19 DIAGNOSIS — R262 Difficulty in walking, not elsewhere classified: Secondary | ICD-10-CM | POA: Diagnosis not present

## 2018-02-19 DIAGNOSIS — M6281 Muscle weakness (generalized): Secondary | ICD-10-CM | POA: Diagnosis not present

## 2018-02-19 DIAGNOSIS — M25551 Pain in right hip: Secondary | ICD-10-CM | POA: Diagnosis not present

## 2018-02-19 DIAGNOSIS — M545 Low back pain: Secondary | ICD-10-CM | POA: Diagnosis not present

## 2018-02-22 DIAGNOSIS — M25551 Pain in right hip: Secondary | ICD-10-CM | POA: Diagnosis not present

## 2018-02-22 DIAGNOSIS — M6281 Muscle weakness (generalized): Secondary | ICD-10-CM | POA: Diagnosis not present

## 2018-02-22 DIAGNOSIS — R262 Difficulty in walking, not elsewhere classified: Secondary | ICD-10-CM | POA: Diagnosis not present

## 2018-02-22 DIAGNOSIS — M545 Low back pain: Secondary | ICD-10-CM | POA: Diagnosis not present

## 2018-02-26 DIAGNOSIS — M545 Low back pain: Secondary | ICD-10-CM | POA: Diagnosis not present

## 2018-02-26 DIAGNOSIS — M6281 Muscle weakness (generalized): Secondary | ICD-10-CM | POA: Diagnosis not present

## 2018-02-26 DIAGNOSIS — M25551 Pain in right hip: Secondary | ICD-10-CM | POA: Diagnosis not present

## 2018-02-26 DIAGNOSIS — R262 Difficulty in walking, not elsewhere classified: Secondary | ICD-10-CM | POA: Diagnosis not present

## 2018-03-01 DIAGNOSIS — M545 Low back pain: Secondary | ICD-10-CM | POA: Diagnosis not present

## 2018-03-01 DIAGNOSIS — M6281 Muscle weakness (generalized): Secondary | ICD-10-CM | POA: Diagnosis not present

## 2018-03-01 DIAGNOSIS — M25551 Pain in right hip: Secondary | ICD-10-CM | POA: Diagnosis not present

## 2018-03-01 DIAGNOSIS — R262 Difficulty in walking, not elsewhere classified: Secondary | ICD-10-CM | POA: Diagnosis not present

## 2018-03-03 DIAGNOSIS — R262 Difficulty in walking, not elsewhere classified: Secondary | ICD-10-CM | POA: Diagnosis not present

## 2018-03-03 DIAGNOSIS — M545 Low back pain: Secondary | ICD-10-CM | POA: Diagnosis not present

## 2018-03-03 DIAGNOSIS — M25551 Pain in right hip: Secondary | ICD-10-CM | POA: Diagnosis not present

## 2018-03-03 DIAGNOSIS — M6281 Muscle weakness (generalized): Secondary | ICD-10-CM | POA: Diagnosis not present

## 2018-03-06 DIAGNOSIS — N390 Urinary tract infection, site not specified: Secondary | ICD-10-CM | POA: Diagnosis not present

## 2018-03-08 DIAGNOSIS — Z1231 Encounter for screening mammogram for malignant neoplasm of breast: Secondary | ICD-10-CM | POA: Diagnosis not present

## 2018-03-08 DIAGNOSIS — M8589 Other specified disorders of bone density and structure, multiple sites: Secondary | ICD-10-CM | POA: Diagnosis not present

## 2018-03-10 DIAGNOSIS — M9903 Segmental and somatic dysfunction of lumbar region: Secondary | ICD-10-CM | POA: Diagnosis not present

## 2018-03-10 DIAGNOSIS — S336XXA Sprain of sacroiliac joint, initial encounter: Secondary | ICD-10-CM | POA: Diagnosis not present

## 2018-03-10 DIAGNOSIS — M545 Low back pain: Secondary | ICD-10-CM | POA: Diagnosis not present

## 2018-03-10 DIAGNOSIS — M62830 Muscle spasm of back: Secondary | ICD-10-CM | POA: Diagnosis not present

## 2018-03-11 DIAGNOSIS — M545 Low back pain: Secondary | ICD-10-CM | POA: Diagnosis not present

## 2018-03-11 DIAGNOSIS — M62830 Muscle spasm of back: Secondary | ICD-10-CM | POA: Diagnosis not present

## 2018-03-11 DIAGNOSIS — S336XXA Sprain of sacroiliac joint, initial encounter: Secondary | ICD-10-CM | POA: Diagnosis not present

## 2018-03-11 DIAGNOSIS — M9903 Segmental and somatic dysfunction of lumbar region: Secondary | ICD-10-CM | POA: Diagnosis not present

## 2018-03-15 DIAGNOSIS — M25551 Pain in right hip: Secondary | ICD-10-CM | POA: Diagnosis not present

## 2018-03-15 DIAGNOSIS — M6281 Muscle weakness (generalized): Secondary | ICD-10-CM | POA: Diagnosis not present

## 2018-03-15 DIAGNOSIS — S336XXA Sprain of sacroiliac joint, initial encounter: Secondary | ICD-10-CM | POA: Diagnosis not present

## 2018-03-15 DIAGNOSIS — M9903 Segmental and somatic dysfunction of lumbar region: Secondary | ICD-10-CM | POA: Diagnosis not present

## 2018-03-15 DIAGNOSIS — M62830 Muscle spasm of back: Secondary | ICD-10-CM | POA: Diagnosis not present

## 2018-03-15 DIAGNOSIS — R262 Difficulty in walking, not elsewhere classified: Secondary | ICD-10-CM | POA: Diagnosis not present

## 2018-03-15 DIAGNOSIS — M545 Low back pain: Secondary | ICD-10-CM | POA: Diagnosis not present

## 2018-03-17 DIAGNOSIS — M62830 Muscle spasm of back: Secondary | ICD-10-CM | POA: Diagnosis not present

## 2018-03-17 DIAGNOSIS — S336XXA Sprain of sacroiliac joint, initial encounter: Secondary | ICD-10-CM | POA: Diagnosis not present

## 2018-03-17 DIAGNOSIS — M9903 Segmental and somatic dysfunction of lumbar region: Secondary | ICD-10-CM | POA: Diagnosis not present

## 2018-03-17 DIAGNOSIS — M545 Low back pain: Secondary | ICD-10-CM | POA: Diagnosis not present

## 2018-03-18 DIAGNOSIS — M545 Low back pain: Secondary | ICD-10-CM | POA: Diagnosis not present

## 2018-03-18 DIAGNOSIS — M62830 Muscle spasm of back: Secondary | ICD-10-CM | POA: Diagnosis not present

## 2018-03-18 DIAGNOSIS — S336XXA Sprain of sacroiliac joint, initial encounter: Secondary | ICD-10-CM | POA: Diagnosis not present

## 2018-03-18 DIAGNOSIS — M9903 Segmental and somatic dysfunction of lumbar region: Secondary | ICD-10-CM | POA: Diagnosis not present

## 2018-03-22 DIAGNOSIS — M9903 Segmental and somatic dysfunction of lumbar region: Secondary | ICD-10-CM | POA: Diagnosis not present

## 2018-03-22 DIAGNOSIS — S336XXA Sprain of sacroiliac joint, initial encounter: Secondary | ICD-10-CM | POA: Diagnosis not present

## 2018-03-22 DIAGNOSIS — M545 Low back pain: Secondary | ICD-10-CM | POA: Diagnosis not present

## 2018-03-22 DIAGNOSIS — M62830 Muscle spasm of back: Secondary | ICD-10-CM | POA: Diagnosis not present

## 2018-03-23 DIAGNOSIS — M545 Low back pain: Secondary | ICD-10-CM | POA: Diagnosis not present

## 2018-03-23 DIAGNOSIS — M62830 Muscle spasm of back: Secondary | ICD-10-CM | POA: Diagnosis not present

## 2018-03-23 DIAGNOSIS — M9903 Segmental and somatic dysfunction of lumbar region: Secondary | ICD-10-CM | POA: Diagnosis not present

## 2018-03-23 DIAGNOSIS — S336XXA Sprain of sacroiliac joint, initial encounter: Secondary | ICD-10-CM | POA: Diagnosis not present

## 2018-03-25 DIAGNOSIS — S336XXA Sprain of sacroiliac joint, initial encounter: Secondary | ICD-10-CM | POA: Diagnosis not present

## 2018-03-25 DIAGNOSIS — M545 Low back pain: Secondary | ICD-10-CM | POA: Diagnosis not present

## 2018-03-25 DIAGNOSIS — M62830 Muscle spasm of back: Secondary | ICD-10-CM | POA: Diagnosis not present

## 2018-03-25 DIAGNOSIS — M9903 Segmental and somatic dysfunction of lumbar region: Secondary | ICD-10-CM | POA: Diagnosis not present

## 2018-03-29 DIAGNOSIS — M62830 Muscle spasm of back: Secondary | ICD-10-CM | POA: Diagnosis not present

## 2018-03-29 DIAGNOSIS — S336XXA Sprain of sacroiliac joint, initial encounter: Secondary | ICD-10-CM | POA: Diagnosis not present

## 2018-03-29 DIAGNOSIS — M9903 Segmental and somatic dysfunction of lumbar region: Secondary | ICD-10-CM | POA: Diagnosis not present

## 2018-03-29 DIAGNOSIS — M545 Low back pain: Secondary | ICD-10-CM | POA: Diagnosis not present

## 2018-04-01 DIAGNOSIS — M62830 Muscle spasm of back: Secondary | ICD-10-CM | POA: Diagnosis not present

## 2018-04-01 DIAGNOSIS — S336XXA Sprain of sacroiliac joint, initial encounter: Secondary | ICD-10-CM | POA: Diagnosis not present

## 2018-04-01 DIAGNOSIS — M9903 Segmental and somatic dysfunction of lumbar region: Secondary | ICD-10-CM | POA: Diagnosis not present

## 2018-04-01 DIAGNOSIS — M545 Low back pain: Secondary | ICD-10-CM | POA: Diagnosis not present

## 2018-04-06 DIAGNOSIS — M62830 Muscle spasm of back: Secondary | ICD-10-CM | POA: Diagnosis not present

## 2018-04-06 DIAGNOSIS — S336XXA Sprain of sacroiliac joint, initial encounter: Secondary | ICD-10-CM | POA: Diagnosis not present

## 2018-04-06 DIAGNOSIS — M9903 Segmental and somatic dysfunction of lumbar region: Secondary | ICD-10-CM | POA: Diagnosis not present

## 2018-04-06 DIAGNOSIS — M545 Low back pain: Secondary | ICD-10-CM | POA: Diagnosis not present

## 2018-04-08 DIAGNOSIS — M62830 Muscle spasm of back: Secondary | ICD-10-CM | POA: Diagnosis not present

## 2018-04-08 DIAGNOSIS — M9903 Segmental and somatic dysfunction of lumbar region: Secondary | ICD-10-CM | POA: Diagnosis not present

## 2018-04-08 DIAGNOSIS — M545 Low back pain: Secondary | ICD-10-CM | POA: Diagnosis not present

## 2018-04-08 DIAGNOSIS — S336XXA Sprain of sacroiliac joint, initial encounter: Secondary | ICD-10-CM | POA: Diagnosis not present

## 2018-04-15 DIAGNOSIS — M9903 Segmental and somatic dysfunction of lumbar region: Secondary | ICD-10-CM | POA: Diagnosis not present

## 2018-04-15 DIAGNOSIS — M62830 Muscle spasm of back: Secondary | ICD-10-CM | POA: Diagnosis not present

## 2018-04-15 DIAGNOSIS — M545 Low back pain: Secondary | ICD-10-CM | POA: Diagnosis not present

## 2018-04-15 DIAGNOSIS — S336XXA Sprain of sacroiliac joint, initial encounter: Secondary | ICD-10-CM | POA: Diagnosis not present

## 2018-04-19 DIAGNOSIS — M9903 Segmental and somatic dysfunction of lumbar region: Secondary | ICD-10-CM | POA: Diagnosis not present

## 2018-04-19 DIAGNOSIS — M62830 Muscle spasm of back: Secondary | ICD-10-CM | POA: Diagnosis not present

## 2018-04-19 DIAGNOSIS — S336XXA Sprain of sacroiliac joint, initial encounter: Secondary | ICD-10-CM | POA: Diagnosis not present

## 2018-04-19 DIAGNOSIS — M545 Low back pain: Secondary | ICD-10-CM | POA: Diagnosis not present

## 2018-04-23 DIAGNOSIS — L821 Other seborrheic keratosis: Secondary | ICD-10-CM | POA: Diagnosis not present

## 2018-04-23 DIAGNOSIS — L570 Actinic keratosis: Secondary | ICD-10-CM | POA: Diagnosis not present

## 2018-05-12 DIAGNOSIS — M62830 Muscle spasm of back: Secondary | ICD-10-CM | POA: Diagnosis not present

## 2018-05-12 DIAGNOSIS — M9903 Segmental and somatic dysfunction of lumbar region: Secondary | ICD-10-CM | POA: Diagnosis not present

## 2018-05-12 DIAGNOSIS — M545 Low back pain: Secondary | ICD-10-CM | POA: Diagnosis not present

## 2018-05-12 DIAGNOSIS — S336XXA Sprain of sacroiliac joint, initial encounter: Secondary | ICD-10-CM | POA: Diagnosis not present

## 2018-05-24 DIAGNOSIS — M545 Low back pain: Secondary | ICD-10-CM | POA: Diagnosis not present

## 2018-05-24 DIAGNOSIS — S336XXA Sprain of sacroiliac joint, initial encounter: Secondary | ICD-10-CM | POA: Diagnosis not present

## 2018-05-24 DIAGNOSIS — M62830 Muscle spasm of back: Secondary | ICD-10-CM | POA: Diagnosis not present

## 2018-05-24 DIAGNOSIS — M9903 Segmental and somatic dysfunction of lumbar region: Secondary | ICD-10-CM | POA: Diagnosis not present

## 2018-05-26 DIAGNOSIS — M62830 Muscle spasm of back: Secondary | ICD-10-CM | POA: Diagnosis not present

## 2018-05-26 DIAGNOSIS — M9903 Segmental and somatic dysfunction of lumbar region: Secondary | ICD-10-CM | POA: Diagnosis not present

## 2018-05-26 DIAGNOSIS — M545 Low back pain: Secondary | ICD-10-CM | POA: Diagnosis not present

## 2018-05-26 DIAGNOSIS — S336XXA Sprain of sacroiliac joint, initial encounter: Secondary | ICD-10-CM | POA: Diagnosis not present

## 2018-06-15 DIAGNOSIS — H40023 Open angle with borderline findings, high risk, bilateral: Secondary | ICD-10-CM | POA: Diagnosis not present

## 2018-06-15 DIAGNOSIS — H40053 Ocular hypertension, bilateral: Secondary | ICD-10-CM | POA: Diagnosis not present

## 2018-06-15 DIAGNOSIS — H3530 Unspecified macular degeneration: Secondary | ICD-10-CM | POA: Diagnosis not present

## 2018-08-03 DIAGNOSIS — F419 Anxiety disorder, unspecified: Secondary | ICD-10-CM | POA: Diagnosis not present

## 2018-08-03 DIAGNOSIS — Z23 Encounter for immunization: Secondary | ICD-10-CM | POA: Diagnosis not present

## 2018-08-03 DIAGNOSIS — E039 Hypothyroidism, unspecified: Secondary | ICD-10-CM | POA: Diagnosis not present

## 2018-08-03 DIAGNOSIS — K219 Gastro-esophageal reflux disease without esophagitis: Secondary | ICD-10-CM | POA: Diagnosis not present

## 2018-08-03 DIAGNOSIS — E785 Hyperlipidemia, unspecified: Secondary | ICD-10-CM | POA: Diagnosis not present

## 2018-08-03 DIAGNOSIS — R7301 Impaired fasting glucose: Secondary | ICD-10-CM | POA: Diagnosis not present

## 2018-08-03 DIAGNOSIS — Z1389 Encounter for screening for other disorder: Secondary | ICD-10-CM | POA: Diagnosis not present

## 2018-08-03 DIAGNOSIS — E041 Nontoxic single thyroid nodule: Secondary | ICD-10-CM | POA: Diagnosis not present

## 2018-08-03 DIAGNOSIS — Z0000 Encounter for general adult medical examination without abnormal findings: Secondary | ICD-10-CM | POA: Diagnosis not present

## 2018-08-03 DIAGNOSIS — E559 Vitamin D deficiency, unspecified: Secondary | ICD-10-CM | POA: Diagnosis not present

## 2018-08-03 DIAGNOSIS — M810 Age-related osteoporosis without current pathological fracture: Secondary | ICD-10-CM | POA: Diagnosis not present

## 2018-08-03 DIAGNOSIS — G479 Sleep disorder, unspecified: Secondary | ICD-10-CM | POA: Diagnosis not present

## 2018-08-05 DIAGNOSIS — E041 Nontoxic single thyroid nodule: Secondary | ICD-10-CM | POA: Diagnosis not present

## 2018-08-05 DIAGNOSIS — Z0000 Encounter for general adult medical examination without abnormal findings: Secondary | ICD-10-CM | POA: Diagnosis not present

## 2018-08-05 DIAGNOSIS — R7301 Impaired fasting glucose: Secondary | ICD-10-CM | POA: Diagnosis not present

## 2018-08-05 DIAGNOSIS — Z136 Encounter for screening for cardiovascular disorders: Secondary | ICD-10-CM | POA: Diagnosis not present

## 2018-08-05 DIAGNOSIS — K219 Gastro-esophageal reflux disease without esophagitis: Secondary | ICD-10-CM | POA: Diagnosis not present

## 2018-08-05 DIAGNOSIS — M810 Age-related osteoporosis without current pathological fracture: Secondary | ICD-10-CM | POA: Diagnosis not present

## 2018-08-05 DIAGNOSIS — Z23 Encounter for immunization: Secondary | ICD-10-CM | POA: Diagnosis not present

## 2018-08-05 DIAGNOSIS — G479 Sleep disorder, unspecified: Secondary | ICD-10-CM | POA: Diagnosis not present

## 2018-08-05 DIAGNOSIS — E785 Hyperlipidemia, unspecified: Secondary | ICD-10-CM | POA: Diagnosis not present

## 2018-08-05 DIAGNOSIS — E559 Vitamin D deficiency, unspecified: Secondary | ICD-10-CM | POA: Diagnosis not present

## 2018-08-05 DIAGNOSIS — Z1389 Encounter for screening for other disorder: Secondary | ICD-10-CM | POA: Diagnosis not present

## 2018-08-05 DIAGNOSIS — F419 Anxiety disorder, unspecified: Secondary | ICD-10-CM | POA: Diagnosis not present

## 2018-08-18 DIAGNOSIS — L900 Lichen sclerosus et atrophicus: Secondary | ICD-10-CM | POA: Diagnosis not present

## 2018-08-26 DIAGNOSIS — Z23 Encounter for immunization: Secondary | ICD-10-CM | POA: Diagnosis not present

## 2018-09-01 DIAGNOSIS — M1712 Unilateral primary osteoarthritis, left knee: Secondary | ICD-10-CM | POA: Diagnosis not present

## 2018-09-01 DIAGNOSIS — M25562 Pain in left knee: Secondary | ICD-10-CM | POA: Diagnosis not present

## 2018-10-13 DIAGNOSIS — D239 Other benign neoplasm of skin, unspecified: Secondary | ICD-10-CM | POA: Diagnosis not present

## 2018-10-13 DIAGNOSIS — Z23 Encounter for immunization: Secondary | ICD-10-CM | POA: Diagnosis not present

## 2018-10-13 DIAGNOSIS — Z808 Family history of malignant neoplasm of other organs or systems: Secondary | ICD-10-CM | POA: Diagnosis not present

## 2018-10-13 DIAGNOSIS — L821 Other seborrheic keratosis: Secondary | ICD-10-CM | POA: Diagnosis not present

## 2018-10-13 DIAGNOSIS — D0439 Carcinoma in situ of skin of other parts of face: Secondary | ICD-10-CM | POA: Diagnosis not present

## 2018-10-13 DIAGNOSIS — D485 Neoplasm of uncertain behavior of skin: Secondary | ICD-10-CM | POA: Diagnosis not present

## 2018-10-28 DIAGNOSIS — E559 Vitamin D deficiency, unspecified: Secondary | ICD-10-CM | POA: Diagnosis not present

## 2018-10-28 DIAGNOSIS — R7303 Prediabetes: Secondary | ICD-10-CM | POA: Diagnosis not present

## 2018-10-29 DIAGNOSIS — M810 Age-related osteoporosis without current pathological fracture: Secondary | ICD-10-CM | POA: Diagnosis not present

## 2018-10-29 DIAGNOSIS — E890 Postprocedural hypothyroidism: Secondary | ICD-10-CM | POA: Diagnosis not present

## 2018-10-29 DIAGNOSIS — E559 Vitamin D deficiency, unspecified: Secondary | ICD-10-CM | POA: Diagnosis not present

## 2018-11-18 DIAGNOSIS — N898 Other specified noninflammatory disorders of vagina: Secondary | ICD-10-CM | POA: Diagnosis not present

## 2018-11-18 DIAGNOSIS — D0439 Carcinoma in situ of skin of other parts of face: Secondary | ICD-10-CM | POA: Diagnosis not present

## 2018-11-18 DIAGNOSIS — L821 Other seborrheic keratosis: Secondary | ICD-10-CM | POA: Diagnosis not present

## 2018-11-18 DIAGNOSIS — L900 Lichen sclerosus et atrophicus: Secondary | ICD-10-CM | POA: Diagnosis not present

## 2019-01-14 DIAGNOSIS — H40023 Open angle with borderline findings, high risk, bilateral: Secondary | ICD-10-CM | POA: Diagnosis not present

## 2019-02-03 DIAGNOSIS — Z86007 Personal history of in-situ neoplasm of skin: Secondary | ICD-10-CM | POA: Diagnosis not present

## 2019-02-03 DIAGNOSIS — L821 Other seborrheic keratosis: Secondary | ICD-10-CM | POA: Diagnosis not present

## 2019-02-03 DIAGNOSIS — Z23 Encounter for immunization: Secondary | ICD-10-CM | POA: Diagnosis not present

## 2019-02-04 DIAGNOSIS — R7303 Prediabetes: Secondary | ICD-10-CM | POA: Diagnosis not present

## 2019-02-04 DIAGNOSIS — E785 Hyperlipidemia, unspecified: Secondary | ICD-10-CM | POA: Diagnosis not present

## 2019-02-04 DIAGNOSIS — G479 Sleep disorder, unspecified: Secondary | ICD-10-CM | POA: Diagnosis not present

## 2019-02-04 DIAGNOSIS — F419 Anxiety disorder, unspecified: Secondary | ICD-10-CM | POA: Diagnosis not present

## 2019-02-04 DIAGNOSIS — E559 Vitamin D deficiency, unspecified: Secondary | ICD-10-CM | POA: Diagnosis not present

## 2019-02-04 DIAGNOSIS — E039 Hypothyroidism, unspecified: Secondary | ICD-10-CM | POA: Diagnosis not present

## 2019-02-25 DIAGNOSIS — H43813 Vitreous degeneration, bilateral: Secondary | ICD-10-CM | POA: Diagnosis not present

## 2019-04-16 DIAGNOSIS — R300 Dysuria: Secondary | ICD-10-CM | POA: Diagnosis not present

## 2019-06-07 DIAGNOSIS — Z1231 Encounter for screening mammogram for malignant neoplasm of breast: Secondary | ICD-10-CM | POA: Diagnosis not present

## 2019-06-07 DIAGNOSIS — M47816 Spondylosis without myelopathy or radiculopathy, lumbar region: Secondary | ICD-10-CM | POA: Diagnosis not present

## 2019-06-07 DIAGNOSIS — M810 Age-related osteoporosis without current pathological fracture: Secondary | ICD-10-CM | POA: Diagnosis not present

## 2019-06-07 DIAGNOSIS — Z8262 Family history of osteoporosis: Secondary | ICD-10-CM | POA: Diagnosis not present

## 2019-06-13 DIAGNOSIS — R42 Dizziness and giddiness: Secondary | ICD-10-CM | POA: Diagnosis not present

## 2019-06-13 DIAGNOSIS — R300 Dysuria: Secondary | ICD-10-CM | POA: Diagnosis not present

## 2019-06-13 DIAGNOSIS — M810 Age-related osteoporosis without current pathological fracture: Secondary | ICD-10-CM | POA: Diagnosis not present

## 2019-06-13 DIAGNOSIS — Z7189 Other specified counseling: Secondary | ICD-10-CM | POA: Diagnosis not present

## 2019-06-21 DIAGNOSIS — H40023 Open angle with borderline findings, high risk, bilateral: Secondary | ICD-10-CM | POA: Diagnosis not present

## 2019-06-21 DIAGNOSIS — H353131 Nonexudative age-related macular degeneration, bilateral, early dry stage: Secondary | ICD-10-CM | POA: Diagnosis not present

## 2019-06-25 DIAGNOSIS — N390 Urinary tract infection, site not specified: Secondary | ICD-10-CM | POA: Diagnosis not present

## 2019-06-30 DIAGNOSIS — M810 Age-related osteoporosis without current pathological fracture: Secondary | ICD-10-CM | POA: Diagnosis not present

## 2019-07-26 DIAGNOSIS — R300 Dysuria: Secondary | ICD-10-CM | POA: Diagnosis not present

## 2019-08-05 DIAGNOSIS — F419 Anxiety disorder, unspecified: Secondary | ICD-10-CM | POA: Diagnosis not present

## 2019-08-05 DIAGNOSIS — E785 Hyperlipidemia, unspecified: Secondary | ICD-10-CM | POA: Diagnosis not present

## 2019-08-05 DIAGNOSIS — E559 Vitamin D deficiency, unspecified: Secondary | ICD-10-CM | POA: Diagnosis not present

## 2019-08-05 DIAGNOSIS — E039 Hypothyroidism, unspecified: Secondary | ICD-10-CM | POA: Diagnosis not present

## 2019-08-05 DIAGNOSIS — G479 Sleep disorder, unspecified: Secondary | ICD-10-CM | POA: Diagnosis not present

## 2019-08-05 DIAGNOSIS — R7303 Prediabetes: Secondary | ICD-10-CM | POA: Diagnosis not present

## 2019-08-12 DIAGNOSIS — E785 Hyperlipidemia, unspecified: Secondary | ICD-10-CM | POA: Diagnosis not present

## 2019-08-12 DIAGNOSIS — N390 Urinary tract infection, site not specified: Secondary | ICD-10-CM | POA: Diagnosis not present

## 2019-08-12 DIAGNOSIS — G479 Sleep disorder, unspecified: Secondary | ICD-10-CM | POA: Diagnosis not present

## 2019-08-12 DIAGNOSIS — R300 Dysuria: Secondary | ICD-10-CM | POA: Diagnosis not present

## 2019-08-12 DIAGNOSIS — F419 Anxiety disorder, unspecified: Secondary | ICD-10-CM | POA: Diagnosis not present

## 2019-08-12 DIAGNOSIS — R7303 Prediabetes: Secondary | ICD-10-CM | POA: Diagnosis not present

## 2019-08-12 DIAGNOSIS — E559 Vitamin D deficiency, unspecified: Secondary | ICD-10-CM | POA: Diagnosis not present

## 2019-08-12 DIAGNOSIS — E039 Hypothyroidism, unspecified: Secondary | ICD-10-CM | POA: Diagnosis not present

## 2019-08-19 DIAGNOSIS — R300 Dysuria: Secondary | ICD-10-CM | POA: Diagnosis not present

## 2019-08-25 DIAGNOSIS — M1711 Unilateral primary osteoarthritis, right knee: Secondary | ICD-10-CM | POA: Diagnosis not present

## 2019-08-25 DIAGNOSIS — M25561 Pain in right knee: Secondary | ICD-10-CM | POA: Diagnosis not present

## 2019-08-26 DIAGNOSIS — N952 Postmenopausal atrophic vaginitis: Secondary | ICD-10-CM | POA: Diagnosis not present

## 2019-08-26 DIAGNOSIS — N390 Urinary tract infection, site not specified: Secondary | ICD-10-CM | POA: Diagnosis not present

## 2019-08-26 DIAGNOSIS — N898 Other specified noninflammatory disorders of vagina: Secondary | ICD-10-CM | POA: Diagnosis not present

## 2019-08-26 DIAGNOSIS — L900 Lichen sclerosus et atrophicus: Secondary | ICD-10-CM | POA: Diagnosis not present

## 2019-08-26 DIAGNOSIS — Z23 Encounter for immunization: Secondary | ICD-10-CM | POA: Diagnosis not present

## 2019-09-15 DIAGNOSIS — M47817 Spondylosis without myelopathy or radiculopathy, lumbosacral region: Secondary | ICD-10-CM | POA: Diagnosis not present

## 2019-09-15 DIAGNOSIS — M5137 Other intervertebral disc degeneration, lumbosacral region: Secondary | ICD-10-CM | POA: Diagnosis not present

## 2019-09-15 DIAGNOSIS — M4316 Spondylolisthesis, lumbar region: Secondary | ICD-10-CM | POA: Diagnosis not present

## 2019-09-15 DIAGNOSIS — M9903 Segmental and somatic dysfunction of lumbar region: Secondary | ICD-10-CM | POA: Diagnosis not present

## 2019-09-15 DIAGNOSIS — M5136 Other intervertebral disc degeneration, lumbar region: Secondary | ICD-10-CM | POA: Diagnosis not present

## 2019-09-19 DIAGNOSIS — M5137 Other intervertebral disc degeneration, lumbosacral region: Secondary | ICD-10-CM | POA: Diagnosis not present

## 2019-09-19 DIAGNOSIS — M4316 Spondylolisthesis, lumbar region: Secondary | ICD-10-CM | POA: Diagnosis not present

## 2019-09-19 DIAGNOSIS — M47817 Spondylosis without myelopathy or radiculopathy, lumbosacral region: Secondary | ICD-10-CM | POA: Diagnosis not present

## 2019-09-19 DIAGNOSIS — M5136 Other intervertebral disc degeneration, lumbar region: Secondary | ICD-10-CM | POA: Diagnosis not present

## 2019-09-19 DIAGNOSIS — M9903 Segmental and somatic dysfunction of lumbar region: Secondary | ICD-10-CM | POA: Diagnosis not present

## 2019-09-19 DIAGNOSIS — M9904 Segmental and somatic dysfunction of sacral region: Secondary | ICD-10-CM | POA: Diagnosis not present

## 2019-09-22 DIAGNOSIS — M9903 Segmental and somatic dysfunction of lumbar region: Secondary | ICD-10-CM | POA: Diagnosis not present

## 2019-09-22 DIAGNOSIS — M5137 Other intervertebral disc degeneration, lumbosacral region: Secondary | ICD-10-CM | POA: Diagnosis not present

## 2019-09-22 DIAGNOSIS — M5136 Other intervertebral disc degeneration, lumbar region: Secondary | ICD-10-CM | POA: Diagnosis not present

## 2019-09-22 DIAGNOSIS — M47817 Spondylosis without myelopathy or radiculopathy, lumbosacral region: Secondary | ICD-10-CM | POA: Diagnosis not present

## 2019-09-22 DIAGNOSIS — M9904 Segmental and somatic dysfunction of sacral region: Secondary | ICD-10-CM | POA: Diagnosis not present

## 2019-09-22 DIAGNOSIS — M4316 Spondylolisthesis, lumbar region: Secondary | ICD-10-CM | POA: Diagnosis not present

## 2019-09-26 DIAGNOSIS — M9904 Segmental and somatic dysfunction of sacral region: Secondary | ICD-10-CM | POA: Diagnosis not present

## 2019-09-26 DIAGNOSIS — M47817 Spondylosis without myelopathy or radiculopathy, lumbosacral region: Secondary | ICD-10-CM | POA: Diagnosis not present

## 2019-09-26 DIAGNOSIS — M5137 Other intervertebral disc degeneration, lumbosacral region: Secondary | ICD-10-CM | POA: Diagnosis not present

## 2019-09-26 DIAGNOSIS — M5136 Other intervertebral disc degeneration, lumbar region: Secondary | ICD-10-CM | POA: Diagnosis not present

## 2019-09-26 DIAGNOSIS — M9903 Segmental and somatic dysfunction of lumbar region: Secondary | ICD-10-CM | POA: Diagnosis not present

## 2019-09-26 DIAGNOSIS — M4316 Spondylolisthesis, lumbar region: Secondary | ICD-10-CM | POA: Diagnosis not present

## 2019-10-03 DIAGNOSIS — M4316 Spondylolisthesis, lumbar region: Secondary | ICD-10-CM | POA: Diagnosis not present

## 2019-10-03 DIAGNOSIS — M9904 Segmental and somatic dysfunction of sacral region: Secondary | ICD-10-CM | POA: Diagnosis not present

## 2019-10-03 DIAGNOSIS — M5137 Other intervertebral disc degeneration, lumbosacral region: Secondary | ICD-10-CM | POA: Diagnosis not present

## 2019-10-03 DIAGNOSIS — M47817 Spondylosis without myelopathy or radiculopathy, lumbosacral region: Secondary | ICD-10-CM | POA: Diagnosis not present

## 2019-10-03 DIAGNOSIS — M9903 Segmental and somatic dysfunction of lumbar region: Secondary | ICD-10-CM | POA: Diagnosis not present

## 2019-10-03 DIAGNOSIS — M5136 Other intervertebral disc degeneration, lumbar region: Secondary | ICD-10-CM | POA: Diagnosis not present

## 2019-10-11 DIAGNOSIS — N3020 Other chronic cystitis without hematuria: Secondary | ICD-10-CM | POA: Diagnosis not present

## 2019-10-13 DIAGNOSIS — M4316 Spondylolisthesis, lumbar region: Secondary | ICD-10-CM | POA: Diagnosis not present

## 2019-10-13 DIAGNOSIS — M9904 Segmental and somatic dysfunction of sacral region: Secondary | ICD-10-CM | POA: Diagnosis not present

## 2019-10-13 DIAGNOSIS — M5137 Other intervertebral disc degeneration, lumbosacral region: Secondary | ICD-10-CM | POA: Diagnosis not present

## 2019-10-13 DIAGNOSIS — M9903 Segmental and somatic dysfunction of lumbar region: Secondary | ICD-10-CM | POA: Diagnosis not present

## 2019-10-13 DIAGNOSIS — M47817 Spondylosis without myelopathy or radiculopathy, lumbosacral region: Secondary | ICD-10-CM | POA: Diagnosis not present

## 2019-10-13 DIAGNOSIS — M5136 Other intervertebral disc degeneration, lumbar region: Secondary | ICD-10-CM | POA: Diagnosis not present

## 2019-10-17 DIAGNOSIS — M9903 Segmental and somatic dysfunction of lumbar region: Secondary | ICD-10-CM | POA: Diagnosis not present

## 2019-10-17 DIAGNOSIS — M5136 Other intervertebral disc degeneration, lumbar region: Secondary | ICD-10-CM | POA: Diagnosis not present

## 2019-10-17 DIAGNOSIS — M5137 Other intervertebral disc degeneration, lumbosacral region: Secondary | ICD-10-CM | POA: Diagnosis not present

## 2019-10-17 DIAGNOSIS — M47817 Spondylosis without myelopathy or radiculopathy, lumbosacral region: Secondary | ICD-10-CM | POA: Diagnosis not present

## 2019-10-17 DIAGNOSIS — M9904 Segmental and somatic dysfunction of sacral region: Secondary | ICD-10-CM | POA: Diagnosis not present

## 2019-10-17 DIAGNOSIS — M4316 Spondylolisthesis, lumbar region: Secondary | ICD-10-CM | POA: Diagnosis not present

## 2019-10-24 DIAGNOSIS — M5137 Other intervertebral disc degeneration, lumbosacral region: Secondary | ICD-10-CM | POA: Diagnosis not present

## 2019-10-24 DIAGNOSIS — M9903 Segmental and somatic dysfunction of lumbar region: Secondary | ICD-10-CM | POA: Diagnosis not present

## 2019-10-24 DIAGNOSIS — M9904 Segmental and somatic dysfunction of sacral region: Secondary | ICD-10-CM | POA: Diagnosis not present

## 2019-10-24 DIAGNOSIS — M47817 Spondylosis without myelopathy or radiculopathy, lumbosacral region: Secondary | ICD-10-CM | POA: Diagnosis not present

## 2019-10-24 DIAGNOSIS — M5136 Other intervertebral disc degeneration, lumbar region: Secondary | ICD-10-CM | POA: Diagnosis not present

## 2019-10-24 DIAGNOSIS — M4316 Spondylolisthesis, lumbar region: Secondary | ICD-10-CM | POA: Diagnosis not present

## 2019-10-27 DIAGNOSIS — Z86007 Personal history of in-situ neoplasm of skin: Secondary | ICD-10-CM | POA: Diagnosis not present

## 2019-10-27 DIAGNOSIS — L821 Other seborrheic keratosis: Secondary | ICD-10-CM | POA: Diagnosis not present

## 2019-10-27 DIAGNOSIS — L219 Seborrheic dermatitis, unspecified: Secondary | ICD-10-CM | POA: Diagnosis not present

## 2019-10-27 DIAGNOSIS — D235 Other benign neoplasm of skin of trunk: Secondary | ICD-10-CM | POA: Diagnosis not present

## 2019-10-27 DIAGNOSIS — Z23 Encounter for immunization: Secondary | ICD-10-CM | POA: Diagnosis not present

## 2019-11-01 DIAGNOSIS — M47817 Spondylosis without myelopathy or radiculopathy, lumbosacral region: Secondary | ICD-10-CM | POA: Diagnosis not present

## 2019-11-01 DIAGNOSIS — M9904 Segmental and somatic dysfunction of sacral region: Secondary | ICD-10-CM | POA: Diagnosis not present

## 2019-11-01 DIAGNOSIS — M4316 Spondylolisthesis, lumbar region: Secondary | ICD-10-CM | POA: Diagnosis not present

## 2019-11-01 DIAGNOSIS — M9903 Segmental and somatic dysfunction of lumbar region: Secondary | ICD-10-CM | POA: Diagnosis not present

## 2019-11-01 DIAGNOSIS — M5136 Other intervertebral disc degeneration, lumbar region: Secondary | ICD-10-CM | POA: Diagnosis not present

## 2019-11-01 DIAGNOSIS — M5137 Other intervertebral disc degeneration, lumbosacral region: Secondary | ICD-10-CM | POA: Diagnosis not present

## 2019-11-07 DIAGNOSIS — M47817 Spondylosis without myelopathy or radiculopathy, lumbosacral region: Secondary | ICD-10-CM | POA: Diagnosis not present

## 2019-11-07 DIAGNOSIS — M4316 Spondylolisthesis, lumbar region: Secondary | ICD-10-CM | POA: Diagnosis not present

## 2019-11-07 DIAGNOSIS — M9904 Segmental and somatic dysfunction of sacral region: Secondary | ICD-10-CM | POA: Diagnosis not present

## 2019-11-07 DIAGNOSIS — M9903 Segmental and somatic dysfunction of lumbar region: Secondary | ICD-10-CM | POA: Diagnosis not present

## 2019-11-07 DIAGNOSIS — M5137 Other intervertebral disc degeneration, lumbosacral region: Secondary | ICD-10-CM | POA: Diagnosis not present

## 2019-11-07 DIAGNOSIS — M5136 Other intervertebral disc degeneration, lumbar region: Secondary | ICD-10-CM | POA: Diagnosis not present

## 2019-11-15 DIAGNOSIS — M5136 Other intervertebral disc degeneration, lumbar region: Secondary | ICD-10-CM | POA: Diagnosis not present

## 2019-11-15 DIAGNOSIS — M47817 Spondylosis without myelopathy or radiculopathy, lumbosacral region: Secondary | ICD-10-CM | POA: Diagnosis not present

## 2019-11-15 DIAGNOSIS — M5137 Other intervertebral disc degeneration, lumbosacral region: Secondary | ICD-10-CM | POA: Diagnosis not present

## 2019-11-15 DIAGNOSIS — M4316 Spondylolisthesis, lumbar region: Secondary | ICD-10-CM | POA: Diagnosis not present

## 2019-11-15 DIAGNOSIS — M9904 Segmental and somatic dysfunction of sacral region: Secondary | ICD-10-CM | POA: Diagnosis not present

## 2019-11-15 DIAGNOSIS — M9903 Segmental and somatic dysfunction of lumbar region: Secondary | ICD-10-CM | POA: Diagnosis not present

## 2019-12-06 DIAGNOSIS — M5137 Other intervertebral disc degeneration, lumbosacral region: Secondary | ICD-10-CM | POA: Diagnosis not present

## 2019-12-06 DIAGNOSIS — M9904 Segmental and somatic dysfunction of sacral region: Secondary | ICD-10-CM | POA: Diagnosis not present

## 2019-12-06 DIAGNOSIS — M47817 Spondylosis without myelopathy or radiculopathy, lumbosacral region: Secondary | ICD-10-CM | POA: Diagnosis not present

## 2019-12-06 DIAGNOSIS — M9903 Segmental and somatic dysfunction of lumbar region: Secondary | ICD-10-CM | POA: Diagnosis not present

## 2019-12-06 DIAGNOSIS — M4316 Spondylolisthesis, lumbar region: Secondary | ICD-10-CM | POA: Diagnosis not present

## 2019-12-06 DIAGNOSIS — M5136 Other intervertebral disc degeneration, lumbar region: Secondary | ICD-10-CM | POA: Diagnosis not present

## 2019-12-13 DIAGNOSIS — M5136 Other intervertebral disc degeneration, lumbar region: Secondary | ICD-10-CM | POA: Diagnosis not present

## 2019-12-13 DIAGNOSIS — M4316 Spondylolisthesis, lumbar region: Secondary | ICD-10-CM | POA: Diagnosis not present

## 2019-12-13 DIAGNOSIS — M47817 Spondylosis without myelopathy or radiculopathy, lumbosacral region: Secondary | ICD-10-CM | POA: Diagnosis not present

## 2019-12-13 DIAGNOSIS — M9904 Segmental and somatic dysfunction of sacral region: Secondary | ICD-10-CM | POA: Diagnosis not present

## 2019-12-13 DIAGNOSIS — M5137 Other intervertebral disc degeneration, lumbosacral region: Secondary | ICD-10-CM | POA: Diagnosis not present

## 2019-12-13 DIAGNOSIS — M9903 Segmental and somatic dysfunction of lumbar region: Secondary | ICD-10-CM | POA: Diagnosis not present

## 2019-12-23 DIAGNOSIS — H40023 Open angle with borderline findings, high risk, bilateral: Secondary | ICD-10-CM | POA: Diagnosis not present

## 2019-12-27 DIAGNOSIS — M5137 Other intervertebral disc degeneration, lumbosacral region: Secondary | ICD-10-CM | POA: Diagnosis not present

## 2019-12-27 DIAGNOSIS — M9904 Segmental and somatic dysfunction of sacral region: Secondary | ICD-10-CM | POA: Diagnosis not present

## 2019-12-27 DIAGNOSIS — M5136 Other intervertebral disc degeneration, lumbar region: Secondary | ICD-10-CM | POA: Diagnosis not present

## 2019-12-27 DIAGNOSIS — M9903 Segmental and somatic dysfunction of lumbar region: Secondary | ICD-10-CM | POA: Diagnosis not present

## 2019-12-27 DIAGNOSIS — M4316 Spondylolisthesis, lumbar region: Secondary | ICD-10-CM | POA: Diagnosis not present

## 2019-12-27 DIAGNOSIS — M47817 Spondylosis without myelopathy or radiculopathy, lumbosacral region: Secondary | ICD-10-CM | POA: Diagnosis not present

## 2020-01-06 DIAGNOSIS — M810 Age-related osteoporosis without current pathological fracture: Secondary | ICD-10-CM | POA: Diagnosis not present

## 2020-01-11 DIAGNOSIS — M47817 Spondylosis without myelopathy or radiculopathy, lumbosacral region: Secondary | ICD-10-CM | POA: Diagnosis not present

## 2020-01-11 DIAGNOSIS — M4316 Spondylolisthesis, lumbar region: Secondary | ICD-10-CM | POA: Diagnosis not present

## 2020-01-11 DIAGNOSIS — M9903 Segmental and somatic dysfunction of lumbar region: Secondary | ICD-10-CM | POA: Diagnosis not present

## 2020-01-11 DIAGNOSIS — M9904 Segmental and somatic dysfunction of sacral region: Secondary | ICD-10-CM | POA: Diagnosis not present

## 2020-01-11 DIAGNOSIS — M5137 Other intervertebral disc degeneration, lumbosacral region: Secondary | ICD-10-CM | POA: Diagnosis not present

## 2020-01-11 DIAGNOSIS — M5136 Other intervertebral disc degeneration, lumbar region: Secondary | ICD-10-CM | POA: Diagnosis not present

## 2020-01-12 DIAGNOSIS — E785 Hyperlipidemia, unspecified: Secondary | ICD-10-CM | POA: Diagnosis not present

## 2020-01-12 DIAGNOSIS — M810 Age-related osteoporosis without current pathological fracture: Secondary | ICD-10-CM | POA: Diagnosis not present

## 2020-01-12 DIAGNOSIS — E039 Hypothyroidism, unspecified: Secondary | ICD-10-CM | POA: Diagnosis not present

## 2020-01-12 DIAGNOSIS — E7800 Pure hypercholesterolemia, unspecified: Secondary | ICD-10-CM | POA: Diagnosis not present

## 2020-01-24 DIAGNOSIS — M9904 Segmental and somatic dysfunction of sacral region: Secondary | ICD-10-CM | POA: Diagnosis not present

## 2020-01-24 DIAGNOSIS — M5136 Other intervertebral disc degeneration, lumbar region: Secondary | ICD-10-CM | POA: Diagnosis not present

## 2020-01-24 DIAGNOSIS — M4316 Spondylolisthesis, lumbar region: Secondary | ICD-10-CM | POA: Diagnosis not present

## 2020-01-24 DIAGNOSIS — M9903 Segmental and somatic dysfunction of lumbar region: Secondary | ICD-10-CM | POA: Diagnosis not present

## 2020-01-24 DIAGNOSIS — M47817 Spondylosis without myelopathy or radiculopathy, lumbosacral region: Secondary | ICD-10-CM | POA: Diagnosis not present

## 2020-01-24 DIAGNOSIS — M5137 Other intervertebral disc degeneration, lumbosacral region: Secondary | ICD-10-CM | POA: Diagnosis not present

## 2020-01-26 DIAGNOSIS — M810 Age-related osteoporosis without current pathological fracture: Secondary | ICD-10-CM | POA: Diagnosis not present

## 2020-01-26 DIAGNOSIS — E7800 Pure hypercholesterolemia, unspecified: Secondary | ICD-10-CM | POA: Diagnosis not present

## 2020-01-26 DIAGNOSIS — E785 Hyperlipidemia, unspecified: Secondary | ICD-10-CM | POA: Diagnosis not present

## 2020-01-26 DIAGNOSIS — E039 Hypothyroidism, unspecified: Secondary | ICD-10-CM | POA: Diagnosis not present

## 2020-02-14 DIAGNOSIS — M5137 Other intervertebral disc degeneration, lumbosacral region: Secondary | ICD-10-CM | POA: Diagnosis not present

## 2020-02-14 DIAGNOSIS — M47817 Spondylosis without myelopathy or radiculopathy, lumbosacral region: Secondary | ICD-10-CM | POA: Diagnosis not present

## 2020-02-14 DIAGNOSIS — M9904 Segmental and somatic dysfunction of sacral region: Secondary | ICD-10-CM | POA: Diagnosis not present

## 2020-02-14 DIAGNOSIS — M4316 Spondylolisthesis, lumbar region: Secondary | ICD-10-CM | POA: Diagnosis not present

## 2020-02-14 DIAGNOSIS — M9903 Segmental and somatic dysfunction of lumbar region: Secondary | ICD-10-CM | POA: Diagnosis not present

## 2020-02-14 DIAGNOSIS — M5136 Other intervertebral disc degeneration, lumbar region: Secondary | ICD-10-CM | POA: Diagnosis not present

## 2020-03-02 DIAGNOSIS — E559 Vitamin D deficiency, unspecified: Secondary | ICD-10-CM | POA: Diagnosis not present

## 2020-03-02 DIAGNOSIS — E039 Hypothyroidism, unspecified: Secondary | ICD-10-CM | POA: Diagnosis not present

## 2020-03-02 DIAGNOSIS — R7303 Prediabetes: Secondary | ICD-10-CM | POA: Diagnosis not present

## 2020-03-02 DIAGNOSIS — E785 Hyperlipidemia, unspecified: Secondary | ICD-10-CM | POA: Diagnosis not present

## 2020-03-15 DIAGNOSIS — E785 Hyperlipidemia, unspecified: Secondary | ICD-10-CM | POA: Diagnosis not present

## 2020-03-15 DIAGNOSIS — E559 Vitamin D deficiency, unspecified: Secondary | ICD-10-CM | POA: Diagnosis not present

## 2020-03-15 DIAGNOSIS — K219 Gastro-esophageal reflux disease without esophagitis: Secondary | ICD-10-CM | POA: Diagnosis not present

## 2020-03-15 DIAGNOSIS — R7303 Prediabetes: Secondary | ICD-10-CM | POA: Diagnosis not present

## 2020-03-15 DIAGNOSIS — E039 Hypothyroidism, unspecified: Secondary | ICD-10-CM | POA: Diagnosis not present

## 2020-03-15 DIAGNOSIS — G479 Sleep disorder, unspecified: Secondary | ICD-10-CM | POA: Diagnosis not present

## 2020-03-15 DIAGNOSIS — E7800 Pure hypercholesterolemia, unspecified: Secondary | ICD-10-CM | POA: Diagnosis not present

## 2020-03-15 DIAGNOSIS — F419 Anxiety disorder, unspecified: Secondary | ICD-10-CM | POA: Diagnosis not present

## 2020-03-30 DIAGNOSIS — E890 Postprocedural hypothyroidism: Secondary | ICD-10-CM | POA: Diagnosis not present

## 2020-03-30 DIAGNOSIS — M810 Age-related osteoporosis without current pathological fracture: Secondary | ICD-10-CM | POA: Diagnosis not present

## 2020-03-30 DIAGNOSIS — E559 Vitamin D deficiency, unspecified: Secondary | ICD-10-CM | POA: Diagnosis not present

## 2020-04-11 DIAGNOSIS — E039 Hypothyroidism, unspecified: Secondary | ICD-10-CM | POA: Diagnosis not present

## 2020-04-11 DIAGNOSIS — M810 Age-related osteoporosis without current pathological fracture: Secondary | ICD-10-CM | POA: Diagnosis not present

## 2020-04-11 DIAGNOSIS — E7800 Pure hypercholesterolemia, unspecified: Secondary | ICD-10-CM | POA: Diagnosis not present

## 2020-04-11 DIAGNOSIS — E785 Hyperlipidemia, unspecified: Secondary | ICD-10-CM | POA: Diagnosis not present

## 2020-05-25 DIAGNOSIS — M25561 Pain in right knee: Secondary | ICD-10-CM | POA: Diagnosis not present

## 2020-06-12 DIAGNOSIS — Z1231 Encounter for screening mammogram for malignant neoplasm of breast: Secondary | ICD-10-CM | POA: Diagnosis not present

## 2020-06-14 DIAGNOSIS — M25561 Pain in right knee: Secondary | ICD-10-CM | POA: Diagnosis not present

## 2020-06-26 DIAGNOSIS — H40013 Open angle with borderline findings, low risk, bilateral: Secondary | ICD-10-CM | POA: Diagnosis not present

## 2020-07-16 DIAGNOSIS — M810 Age-related osteoporosis without current pathological fracture: Secondary | ICD-10-CM | POA: Diagnosis not present

## 2020-07-16 DIAGNOSIS — E039 Hypothyroidism, unspecified: Secondary | ICD-10-CM | POA: Diagnosis not present

## 2020-07-16 DIAGNOSIS — E7800 Pure hypercholesterolemia, unspecified: Secondary | ICD-10-CM | POA: Diagnosis not present

## 2020-07-16 DIAGNOSIS — E785 Hyperlipidemia, unspecified: Secondary | ICD-10-CM | POA: Diagnosis not present

## 2020-08-17 DIAGNOSIS — E785 Hyperlipidemia, unspecified: Secondary | ICD-10-CM | POA: Diagnosis not present

## 2020-08-17 DIAGNOSIS — G479 Sleep disorder, unspecified: Secondary | ICD-10-CM | POA: Diagnosis not present

## 2020-08-17 DIAGNOSIS — K219 Gastro-esophageal reflux disease without esophagitis: Secondary | ICD-10-CM | POA: Diagnosis not present

## 2020-08-17 DIAGNOSIS — E039 Hypothyroidism, unspecified: Secondary | ICD-10-CM | POA: Diagnosis not present

## 2020-08-17 DIAGNOSIS — E7800 Pure hypercholesterolemia, unspecified: Secondary | ICD-10-CM | POA: Diagnosis not present

## 2020-08-17 DIAGNOSIS — E559 Vitamin D deficiency, unspecified: Secondary | ICD-10-CM | POA: Diagnosis not present

## 2020-08-17 DIAGNOSIS — F419 Anxiety disorder, unspecified: Secondary | ICD-10-CM | POA: Diagnosis not present

## 2020-08-17 DIAGNOSIS — R7303 Prediabetes: Secondary | ICD-10-CM | POA: Diagnosis not present

## 2020-08-17 DIAGNOSIS — Z1159 Encounter for screening for other viral diseases: Secondary | ICD-10-CM | POA: Diagnosis not present

## 2020-08-28 DIAGNOSIS — K219 Gastro-esophageal reflux disease without esophagitis: Secondary | ICD-10-CM | POA: Diagnosis not present

## 2020-08-28 DIAGNOSIS — E039 Hypothyroidism, unspecified: Secondary | ICD-10-CM | POA: Diagnosis not present

## 2020-08-28 DIAGNOSIS — M810 Age-related osteoporosis without current pathological fracture: Secondary | ICD-10-CM | POA: Diagnosis not present

## 2020-08-28 DIAGNOSIS — F419 Anxiety disorder, unspecified: Secondary | ICD-10-CM | POA: Diagnosis not present

## 2020-08-28 DIAGNOSIS — Z0000 Encounter for general adult medical examination without abnormal findings: Secondary | ICD-10-CM | POA: Diagnosis not present

## 2020-08-28 DIAGNOSIS — E559 Vitamin D deficiency, unspecified: Secondary | ICD-10-CM | POA: Diagnosis not present

## 2020-08-28 DIAGNOSIS — E785 Hyperlipidemia, unspecified: Secondary | ICD-10-CM | POA: Diagnosis not present

## 2020-08-28 DIAGNOSIS — R7303 Prediabetes: Secondary | ICD-10-CM | POA: Diagnosis not present

## 2020-08-28 DIAGNOSIS — E7800 Pure hypercholesterolemia, unspecified: Secondary | ICD-10-CM | POA: Diagnosis not present

## 2020-08-28 DIAGNOSIS — G479 Sleep disorder, unspecified: Secondary | ICD-10-CM | POA: Diagnosis not present

## 2020-08-28 DIAGNOSIS — Z23 Encounter for immunization: Secondary | ICD-10-CM | POA: Diagnosis not present

## 2020-08-30 DIAGNOSIS — Z1159 Encounter for screening for other viral diseases: Secondary | ICD-10-CM | POA: Diagnosis not present

## 2020-09-03 DIAGNOSIS — Z1211 Encounter for screening for malignant neoplasm of colon: Secondary | ICD-10-CM | POA: Diagnosis not present

## 2020-09-24 DIAGNOSIS — M810 Age-related osteoporosis without current pathological fracture: Secondary | ICD-10-CM | POA: Diagnosis not present

## 2020-09-24 DIAGNOSIS — E039 Hypothyroidism, unspecified: Secondary | ICD-10-CM | POA: Diagnosis not present

## 2020-09-24 DIAGNOSIS — E7800 Pure hypercholesterolemia, unspecified: Secondary | ICD-10-CM | POA: Diagnosis not present

## 2020-09-24 DIAGNOSIS — E785 Hyperlipidemia, unspecified: Secondary | ICD-10-CM | POA: Diagnosis not present

## 2020-10-19 DIAGNOSIS — H6120 Impacted cerumen, unspecified ear: Secondary | ICD-10-CM | POA: Diagnosis not present

## 2020-10-19 DIAGNOSIS — H9313 Tinnitus, bilateral: Secondary | ICD-10-CM | POA: Diagnosis not present

## 2020-10-19 DIAGNOSIS — H6982 Other specified disorders of Eustachian tube, left ear: Secondary | ICD-10-CM | POA: Diagnosis not present

## 2020-11-19 DIAGNOSIS — L821 Other seborrheic keratosis: Secondary | ICD-10-CM | POA: Diagnosis not present

## 2020-11-19 DIAGNOSIS — H02823 Cysts of right eye, unspecified eyelid: Secondary | ICD-10-CM | POA: Diagnosis not present

## 2020-11-19 DIAGNOSIS — Z86007 Personal history of in-situ neoplasm of skin: Secondary | ICD-10-CM | POA: Diagnosis not present

## 2020-11-19 DIAGNOSIS — N904 Leukoplakia of vulva: Secondary | ICD-10-CM | POA: Diagnosis not present

## 2020-11-19 DIAGNOSIS — L570 Actinic keratosis: Secondary | ICD-10-CM | POA: Diagnosis not present

## 2020-11-19 DIAGNOSIS — D235 Other benign neoplasm of skin of trunk: Secondary | ICD-10-CM | POA: Diagnosis not present

## 2020-11-19 DIAGNOSIS — L723 Sebaceous cyst: Secondary | ICD-10-CM | POA: Diagnosis not present

## 2020-11-21 DIAGNOSIS — F419 Anxiety disorder, unspecified: Secondary | ICD-10-CM | POA: Diagnosis not present

## 2020-11-21 DIAGNOSIS — H6982 Other specified disorders of Eustachian tube, left ear: Secondary | ICD-10-CM | POA: Diagnosis not present

## 2020-11-21 DIAGNOSIS — E039 Hypothyroidism, unspecified: Secondary | ICD-10-CM | POA: Diagnosis not present

## 2020-11-21 DIAGNOSIS — M545 Low back pain, unspecified: Secondary | ICD-10-CM | POA: Diagnosis not present

## 2020-11-21 DIAGNOSIS — G8929 Other chronic pain: Secondary | ICD-10-CM | POA: Diagnosis not present

## 2020-11-21 DIAGNOSIS — H9313 Tinnitus, bilateral: Secondary | ICD-10-CM | POA: Diagnosis not present

## 2020-11-28 ENCOUNTER — Other Ambulatory Visit: Payer: Self-pay

## 2020-11-28 ENCOUNTER — Encounter: Payer: Self-pay | Admitting: Physical Therapy

## 2020-11-28 ENCOUNTER — Ambulatory Visit: Payer: PPO | Attending: Family Medicine | Admitting: Physical Therapy

## 2020-11-28 DIAGNOSIS — M6281 Muscle weakness (generalized): Secondary | ICD-10-CM | POA: Insufficient documentation

## 2020-11-28 DIAGNOSIS — M545 Low back pain, unspecified: Secondary | ICD-10-CM | POA: Diagnosis not present

## 2020-11-28 DIAGNOSIS — G8929 Other chronic pain: Secondary | ICD-10-CM | POA: Diagnosis not present

## 2020-11-28 NOTE — Therapy (Signed)
[facility] [address] [place], [place], [address] Phone: [phone]   Fax:  [phone]  Physical Therapy Evaluation  Patient Details  Name: Kimberly Ryan MRN: [id number] Date of Birth: [birth year] Referring Provider (PT): Marda Stalker, [place]   Encounter Date: 11/28/2020   PT End of Session - 11/28/20 1255    Visit Number 1    Date for PT Re-Evaluation 01/23/21    Authorization Type [employer]    Progress Note Due on Visit 10    PT Start Time 1017    PT Stop Time 1100    PT Time Calculation (min) 43 min    Activity Tolerance Patient tolerated treatment well;No increased pain    Behavior During Therapy WFL for tasks assessed/performed           Past Medical History:  Diagnosis Date  . Anxiety   . Arthritis   . GERD (gastroesophageal reflux disease)   . Headache(784.0)   . Shortness of breath    with exertion   . Thyroid disease     Past Surgical History:  Procedure Laterality Date  . MYOMECTOMY  2000  . RHINOPLASTY  1953  . THYROID LOBECTOMY  08/17/2012   Procedure: THYROID LOBECTOMY;  Surgeon: [name], MD,FACS;  Location: [facility];  Service: General;  Laterality: Left;  Left Thyroid Lobectomy  . TONSILLECTOMY  1954    There were no vitals filed for this visit.    Subjective Assessment - 11/28/20 1023    Subjective I have pain across my low back.  I think my muscles are getting weak.  I walk briskly x 30 min every day.  I tried some exercises from a book and may have made myself worse. (bird dog)    Pertinent History osteopenia    Limitations Lifting;Standing;House hold activities;Other (comment)   transfers in/out of bed, chair, car   How long can you stand comfortably? 5 min    Patient Stated Goals get stronger, reduce pain with activities    Currently in Pain? Yes    Pain Score 7    0/10 sitting, up to 9/38 with certain movements   Pain Location Back     Pain Orientation Left;Right;Lower    Pain Type Chronic pain    Pain Onset More than a month ago    Pain Frequency Intermittent    Aggravating Factors  prolonged standing, transfers, household tasks with bending/lifting    Pain Relieving Factors ibuprofen 1x/day              [facility] PT Assessment - 11/28/20 0001      Assessment   Medical Diagnosis M54.50 (ICD-10-CM) - Low back pain, unspecified    Referring Provider (PT) Marda Stalker, PA-C    Onset Date/Surgical Date --   6-8 weeks ago   Hand Dominance Right    Next MD Visit early March    Prior Therapy yes      Precautions   Precaution Comments osteopenia      Restrictions   Weight Bearing Restrictions No      Balance Screen   Has the patient fallen in the past 6 months No      [place] residence    Living Arrangements Spouse/significant other    Home Access Stairs to enter    Entrance Stairs-Number of Steps 3    [place] Two level      Prior Function   Level  of Independence Independent    Vocation Part time employment    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[employer], piano, reading, puzzles      Cognition   Overall Cognitive Status Within Functional Limits for tasks assessed      Observation/Other Assessments   Focus on Therapeutic Outcomes (FOTO)  33% goal 29%      Functional Tests   Functional tests Single leg stance      Single Leg Stance   Comments able to balance on Rt/Lt x 6+ sec      Posture/Postural Control   Posture/Postural Control Postural limitations    Postural Limitations Forward head;Increased thoracic kyphosis      ROM / Strength   AROM / PROM / Strength AROM;PROM;Strength      AROM   AROM Assessment Site Lumbar;Thoracic    Lumbar Flexion full without pain    Lumbar Extension 10    Lumbar - Right Side Bend WNL    Lumbar - Left Side Bend WNL    Thoracic - Right Rotation limited 50%    Thoracic - Left Rotation  limited 50%      PROM   Overall PROM  Deficits    Overall PROM Comments Lt hip ER and flexion limited 25% compared to Rt      Strength   Overall Strength Comments LEs 4+/5 throughout, core 3+/5, lack of deep multifidus bulk bil L3-S1      Flexibility   Soft Tissue Assessment /Muscle Length yes    Hamstrings lacks 10 deg bil    Piriformis limited 20%      Palpation   Spinal mobility lack of lumbar flexion, limited upper thoracic ext    Palpation comment non-tender bil hips and spine      Ambulation/Gait   Gait Comments symmetrical, good hip stability                      Objective measurements completed on examination: See above findings.                 PT Short Term Goals - 11/28/20 1304      PT SHORT TERM GOAL #1   Title Pt will be ind with initial HEP    Time 3    Period Weeks    Status New    Target Date 12/19/20      PT SHORT TERM GOAL #2   Title Pt will demo proper activation of deep core with good breath control    Time 3    Period Weeks    Status New    Target Date 12/19/20      PT SHORT TERM GOAL #3   Title Pt will demo proper body mechanics with bed mobility and chair transfers without pain    Time 3    Period Weeks    Status New    Target Date 12/19/20             PT Long Term Goals - 11/28/20 1305      PT LONG TERM GOAL #1   Title Pt will be ind with advanced HEP and walking program with pain reduced by at least 75%    Time 8    Period Weeks    Status New    Target Date 01/23/21      PT LONG TERM GOAL #2   Title Pt will be able to perform light standing household tasks for  up to 30 min with min to no pain.    Time 8    Period Weeks    Status New    Target Date 01/23/21      PT LONG TERM GOAL #3   Title Pt will demo proper bend, lift and carry x 20' of 10lb box with good core and body mechanics to simulate grocery and laundry tasks.    Time 8    Period Weeks    Status New    Target Date 01/23/21      PT  LONG TERM GOAL #4   Title Pt will achieve Lt hip ROM symmetrical with Rt to demo improved flexiblity and reduce undue strain on spine.    Time 8    Period Weeks    Status New    Target Date 01/23/21                  Plan - 11/28/20 1257    Clinical Impression Statement Pt referred to OPP[facility] with onset of low back pain which started approx 6-8 weeks ago.  She identifies her pain by pointing to T/L junction.  She walks briskly daily x 30 min which improves her pain, along with a dose of iburpofen daily.  She has pain with position transitions, bending/lifting tasks around her home, prolonged static standing, and first thing in the AM.  She has good strength in bil LEs and symmetrical gait pattern.  She does present with core weakness and lack of muscle bulk in bil lumbar multifidi.  She appears to compensate with bil thoracic paraspinals which are tight but non-tender.  She has some end range flexibility limitations in bil hamstrings and Lt hip.  PT initiated HEP for stretching and core activation in SL.  Pt will benefit from skilled PT for strength, stability, flexibility, body mechanics and postural endurance for improved tolerance of daily tasks.    Personal Factors and Comorbidities Comorbidity 1    Comorbidities osteopenia    Examination-Activity Limitations Bend;Lift;Stand;Bed Mobility;Transfers    Examination-Participation Restrictions Cleaning;Community Activity;Shop;Laundry;Yard Work    Stability/Clinical Decision Making Stable/Uncomplicated    Designer, jewellery Low    Rehab Potential Excellent    PT Frequency 2x / week    PT Duration 8 weeks    PT Treatment/Interventions ADLs/Self Care Home Management;Moist Heat;Cryotherapy;Neuromuscular re-education;Therapeutic exercise;Functional mobility training;Therapeutic activities;Manual techniques;Patient/family education;Passive range of motion;Dry needling;Joint Manipulations;Spinal Manipulations    PT Next Visit Plan review  stretches from HEP, recheck TrA activation, progress core, sit to stand with TrA for transfer work, thoracic STM along paraspinals    PT [place] and Agree with Plan of Care Patient           Patient will benefit from skilled therapeutic intervention in order to improve the following deficits and impairments:  Decreased range of motion,Pain,Postural dysfunction,Decreased strength,Improper body mechanics,Impaired flexibility,Hypomobility  Visit Diagnosis: Chronic midline low back pain without sciatica - Plan: PT plan of care cert/re-cert  Muscle weakness (generalized) - Plan: PT plan of care cert/re-cert     Problem List Patient Active Problem List   Diagnosis Date Noted  . Postsurgical hypothyroidism 11/04/2012  . Multiple thyroid nodules 12/19/2011    Baruch Merl, PT 11/28/20 1:10 PM   [facility] [address] [place], [place], [address] Phone: [phone]   Fax:  [phone]  Name: Kimberly Ryan MRN: [id number] Date of Birth: 10-12-1951

## 2020-12-03 ENCOUNTER — Ambulatory Visit: Payer: PPO | Admitting: Physical Therapy

## 2020-12-03 ENCOUNTER — Encounter: Payer: Self-pay | Admitting: Physical Therapy

## 2020-12-03 ENCOUNTER — Other Ambulatory Visit: Payer: Self-pay

## 2020-12-03 DIAGNOSIS — M545 Low back pain, unspecified: Secondary | ICD-10-CM

## 2020-12-03 DIAGNOSIS — M6281 Muscle weakness (generalized): Secondary | ICD-10-CM

## 2020-12-03 NOTE — Therapy (Signed)
[facility] [address], [place], [place], [address] Phone: [phone]   Fax:  [phone]  Physical Therapy Treatment  Patient Details  Name: Kimberly Ryan MRN: [id number] Date of Birth: 1951-03-24 Referring Provider (PT): Marda Stalker, [place]   Encounter Date: 12/03/2020   PT End of Session - 12/03/20 1104    Visit Number 2    Date for PT Re-Evaluation 01/23/21    Authorization Type [employer]    Progress Note Due on Visit 10    PT Start Time 1104    PT Stop Time 1153    PT Time Calculation (min) 49 min    Activity Tolerance Patient tolerated treatment well;No increased pain    Behavior During Therapy WFL for tasks assessed/performed           Past Medical History:  Diagnosis Date  . Anxiety   . Arthritis   . GERD (gastroesophageal reflux disease)   . Headache(784.0)   . Shortness of breath    with exertion   . Thyroid disease     Past Surgical History:  Procedure Laterality Date  . MYOMECTOMY  2000  . RHINOPLASTY  1953  . THYROID LOBECTOMY  08/17/2012   Procedure: THYROID LOBECTOMY;  Surgeon: [name], MD,FACS;  Location: [facility];  Service: General;  Laterality: Left;  Left Thyroid Lobectomy  . TONSILLECTOMY  1954    There were no vitals filed for this visit.   Subjective Assessment - 12/03/20 1105    Subjective Felt ok but have not done much of my HEP because I had a concert to perform in.    Pertinent History osteopenia    Limitations Lifting;Standing;House hold activities;Other (comment)    Currently in Pain? No/denies                             [facility] Adult PT Treatment/Exercise - 12/03/20 0001      Lumbar Exercises: Stretches   Active Hamstring Stretch Left;Right;2 reps;20 seconds    Figure 4 Stretch 2 reps;20 seconds   Bil   Other Lumbar Stretch Exercise Hip cross over stretch Bil 2x 20 sec    Other Lumbar Stretch Exercise  Verbally instructed pt how to lay with LTLE in ER when resting in her bed to help stretch her joint.      Lumbar Exercises: Aerobic   Nustep L1 x 5 min to end sesison.      Lumbar Exercises: Seated   Other Seated Lumbar Exercises red band seated multifidus contraction 5x2 3 sec      Lumbar Exercises: Sidelying   Other Sidelying Lumbar Exercises TA contraction 5x2                    PT Short Term Goals - 11/28/20 1304      PT SHORT TERM GOAL #1   Title Pt will be ind with initial HEP    Time 3    Period Weeks    Status New    Target Date 12/19/20      PT SHORT TERM GOAL #2   Title Pt will demo proper activation of deep core with good breath control    Time 3    Period Weeks    Status New    Target Date 12/19/20      PT SHORT TERM GOAL #3   Title Pt will demo proper body mechanics with bed mobility  and chair transfers without pain    Time 3    Period Weeks    Status New    Target Date 12/19/20             PT Long Term Goals - 11/28/20 1305      PT LONG TERM GOAL #1   Title Pt will be ind with advanced HEP and walking program with pain reduced by at least 75%    Time 8    Period Weeks    Status New    Target Date 01/23/21      PT LONG TERM GOAL #2   Title Pt will be able to perform light standing household tasks for up to 30 min with min to no pain.    Time 8    Period Weeks    Status New    Target Date 01/23/21      PT LONG TERM GOAL #3   Title Pt will demo proper bend, lift and carry x 20' of 10lb box with good core and body mechanics to simulate grocery and laundry tasks.    Time 8    Period Weeks    Status New    Target Date 01/23/21      PT LONG TERM GOAL #4   Title Pt will achieve Lt hip ROM symmetrical with Rt to demo improved flexiblity and reduce undue strain on spine.    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 We reviewed initial stretches which pt was already doing some. She reports she can tell the difference between her RT hip LT ROM. PTA suggested to concentrate a little more on her LT hip than RT at home with her stretches. Pt has good command of her lower abdominals in sidelying.    Personal Factors and Comorbidities Comorbidity 1    Comorbidities osteopenia    Examination-Activity Limitations Bend;Lift;Stand;Bed Mobility;Transfers    Examination-Participation Restrictions Cleaning;Community Activity;Shop;Laundry;Yard Work    Stability/Clinical Decision Making Stable/Uncomplicated    Rehab Potential Excellent    PT Frequency 2x / week    PT Duration 8 weeks    PT Treatment/Interventions ADLs/Self Care Home Management;Moist Heat;Cryotherapy;Neuromuscular re-education;Therapeutic exercise;Functional mobility training;Therapeutic activities;Manual techniques;Patient/family education;Passive range of motion;Dry needling;Joint Manipulations;Spinal Manipulations    PT Next Visit Plan Progress core, practice transitional movement with core activation, continue to activate multifidus.    PT Home Exercise Plan [id number]    Consulted and Agree with Plan of Care Patient           Patient will benefit from skilled therapeutic intervention in order to improve the following deficits and impairments:  Decreased range of motion,Pain,Postural dysfunction,Decreased strength,Improper body mechanics,Impaired flexibility,Hypomobility  Visit Diagnosis: Chronic midline low back pain without sciatica  Muscle weakness (generalized)     Problem List Patient Active Problem List   Diagnosis Date Noted  . Postsurgical hypothyroidism 11/04/2012  . Multiple thyroid nodules 12/19/2011    Kimberly Ryan, PTA 12/03/2020, 1:56 PM  [facility] [address]  [place], [place], [address] Phone: [phone]   Fax:  [phone]  Name: Kimberly Ryan MRN: [id number] Date  of Birth: 12/03/1951

## 2020-12-12 ENCOUNTER — Ambulatory Visit: Payer: PPO | Admitting: Physical Therapy

## 2020-12-19 ENCOUNTER — Encounter: Payer: Self-pay | Admitting: Physical Therapy

## 2020-12-19 ENCOUNTER — Other Ambulatory Visit: Payer: Self-pay

## 2020-12-19 ENCOUNTER — Ambulatory Visit: Payer: PPO | Attending: Family Medicine | Admitting: Physical Therapy

## 2020-12-19 DIAGNOSIS — M545 Low back pain, unspecified: Secondary | ICD-10-CM | POA: Insufficient documentation

## 2020-12-19 DIAGNOSIS — M6281 Muscle weakness (generalized): Secondary | ICD-10-CM | POA: Insufficient documentation

## 2020-12-19 DIAGNOSIS — G8929 Other chronic pain: Secondary | ICD-10-CM | POA: Diagnosis not present

## 2020-12-19 NOTE — Therapy (Signed)
[facility] [address] [place], [place], [address] Phone: [phone]   Fax:  [phone]  Physical Therapy Treatment  Patient Details  Name: Kimberly Ryan MRN: [id number] Date of Birth: May 31, 1951 Referring Provider (PT): Jarrett Soho, [place]   Encounter Date: 12/19/2020   PT End of Session - 12/19/20 1152    Visit Number 3    Date for PT Re-Evaluation 01/23/21    Authorization Type [employer]    PT Start Time 1152    PT Stop Time 1230    PT Time Calculation (min) 38 min    Activity Tolerance Patient tolerated treatment well;No increased pain    Behavior During Therapy WFL for tasks assessed/performed           Past Medical History:  Diagnosis Date  . Anxiety   . Arthritis   . GERD (gastroesophageal reflux disease)   . Headache(784.0)   . Shortness of breath    with exertion   . Thyroid disease     Past Surgical History:  Procedure Laterality Date  . MYOMECTOMY  2000  . RHINOPLASTY  1953  . THYROID LOBECTOMY  08/17/2012   Procedure: THYROID LOBECTOMY;  Surgeon: Atilano Ina, MD,FACS;  Location: [facility];  Service: General;  Laterality: Left;  Left Thyroid Lobectomy  . TONSILLECTOMY  1954    There were no vitals filed for this visit.   Subjective Assessment - 12/19/20 1155    Subjective My pain that brought me here is pretty much gone. Not sure why, but pt is glad.                             [facility] Adult PT Treatment/Exercise - 12/19/20 0001      Lumbar Exercises: Aerobic   Nustep L3 x 6 min with PTA present      Lumbar Exercises: Seated   Other Seated Lumbar Exercises red band seated multifidus contraction 10x2 3 sec      Lumbar Exercises: Supine   Clam 20 reps    Clam Limitations green band: issued for home    Bridge 10 reps;3 seconds   Green band for hip abd; VC for technique     Lumbar Exercises: Sidelying   Clam Both;10 reps   red band  added, VC for core engagment                   PT Short Term Goals - 11/28/20 1304      PT SHORT TERM GOAL #1   Title Pt will be ind with initial HEP    Time 3    Period Weeks    Status New    Target Date 12/19/20      PT SHORT TERM GOAL #2   Title Pt will demo proper activation of deep core with good breath control    Time 3    Period Weeks    Status New    Target Date 12/19/20      PT SHORT TERM GOAL #3   Title Pt will demo proper body mechanics with bed mobility and chair transfers without pain    Time 3    Period Weeks    Status New    Target Date 12/19/20             PT Long Term Goals - 11/28/20 1305      PT LONG TERM GOAL #1  Title Pt will be ind with advanced HEP and walking program with pain reduced by at least 75%    Time 8    Period Weeks    Status New    Target Date 01/23/21      PT LONG TERM GOAL #2   Title Pt will be able to perform light standing household tasks for up to 30 min with min to no pain.    Time 8    Period Weeks    Status New    Target Date 01/23/21      PT LONG TERM GOAL #3   Title Pt will demo proper bend, lift and carry x 20' of 10lb box with good core and body mechanics to simulate grocery and laundry tasks.    Time 8    Period Weeks    Status New    Target Date 01/23/21      PT LONG TERM GOAL #4   Title Pt will achieve Lt hip ROM symmetrical with Rt to demo improved flexiblity and reduce undue strain on spine.    Time 8    Period Weeks    Status New    Target Date 01/23/21                 Plan - 12/19/20 1202    Clinical Impression Statement Pt arrives pain free. She reports the back pain that made her come here is prety much abolished but she knows she has to strengthen her core better. HEp was progressed to include more core challenge. Pt performed all exercises pain free and with exceptional technique.    Personal Factors and Comorbidities Comorbidity 1    Comorbidities osteopenia     Examination-Activity Limitations Bend;Lift;Stand;Bed Mobility;Transfers    Examination-Participation Restrictions Cleaning;Community Activity;Shop;Laundry;Yard Work    Stability/Clinical Decision Making Stable/Uncomplicated    Rehab Potential Excellent    PT Frequency 2x / week    PT Duration 8 weeks    PT Treatment/Interventions ADLs/Self Care Home Management;Moist Heat;Cryotherapy;Neuromuscular re-education;Therapeutic exercise;Functional mobility training;Therapeutic activities;Manual techniques;Patient/family education;Passive range of motion;Dry needling;Joint Manipulations;Spinal Manipulations    PT Next Visit Plan Progress core, practice transitional movement with core activation, continue to activate multifidus.    PT Home Exercise Plan [id number]           Patient will benefit from skilled therapeutic intervention in order to improve the following deficits and impairments:  Decreased range of motion,Pain,Postural dysfunction,Decreased strength,Improper body mechanics,Impaired flexibility,Hypomobility  Visit Diagnosis: Chronic midline low back pain without sciatica  Muscle weakness (generalized)     Problem List Patient Active Problem List   Diagnosis Date Noted  . Postsurgical hypothyroidism 11/04/2012  . Multiple thyroid nodules 12/19/2011    Denarius Sesler, PTA 12/19/2020, 12:31 PM   [facility] [address] [place], [place], [address] Phone: [phone]   Fax:  [phone]  Name: Kimberly Ryan MRN: [id number] Date of Birth: 1951/06/14

## 2020-12-25 ENCOUNTER — Encounter: Payer: Self-pay | Admitting: Physical Therapy

## 2020-12-25 ENCOUNTER — Ambulatory Visit: Payer: PPO | Admitting: Physical Therapy

## 2020-12-25 ENCOUNTER — Other Ambulatory Visit: Payer: Self-pay

## 2020-12-25 DIAGNOSIS — G8929 Other chronic pain: Secondary | ICD-10-CM

## 2020-12-25 DIAGNOSIS — M6281 Muscle weakness (generalized): Secondary | ICD-10-CM

## 2020-12-25 DIAGNOSIS — M545 Low back pain, unspecified: Secondary | ICD-10-CM | POA: Diagnosis not present

## 2020-12-25 NOTE — Therapy (Signed)
[facility] [address] [place], [place], [address] Phone: [phone]   Fax:  [phone]  Physical Therapy Treatment  Patient Details  Name: Kimberly Ryan MRN: [id number] Date of Birth: 11-Jan-1951 Referring Provider (PT): Marda Stalker, [place]   Encounter Date: 12/25/2020   PT End of Session - 12/25/20 1358    Visit Number 4    Date for PT Re-Evaluation 01/23/21    Authorization Type [employer]    Progress Note Due on Visit 10    PT Start Time 1400    PT Stop Time 8546    PT Time Calculation (min) 47 min    Activity Tolerance Patient tolerated treatment well;No increased pain    Behavior During Therapy WFL for tasks assessed/performed           Past Medical History:  Diagnosis Date  . Anxiety   . Arthritis   . GERD (gastroesophageal reflux disease)   . Headache(784.0)   . Shortness of breath    with exertion   . Thyroid disease     Past Surgical History:  Procedure Laterality Date  . MYOMECTOMY  2000  . RHINOPLASTY  1953  . THYROID LOBECTOMY  08/17/2012   Procedure: THYROID LOBECTOMY;  Surgeon: [name], MD,FACS;  Location: [facility];  Service: General;  Laterality: Left;  Left Thyroid Lobectomy  . TONSILLECTOMY  1954    There were no vitals filed for this visit.   Subjective Assessment - 12/25/20 1359    Subjective Still no pain.  Haven't been doing my HEP as much as I should due to busy season.    Pertinent History osteopenia    How long can you stand comfortably? 15-20 min    Patient Stated Goals get stronger, reduce pain with activities    Currently in Pain? No/denies                             [facility] Adult PT Treatment/Exercise - 12/25/20 0001      Exercises   Exercises Lumbar;Knee/Hip;Shoulder      Lumbar Exercises: Stretches   Single Knee to Chest Stretch 2 reps;Right;Left;10 seconds    Figure 4 Stretch 2 reps;20 seconds;Supine;With  overpressure    Figure 4 Stretch Limitations bil      Lumbar Exercises: Aerobic   Nustep L3 x 6' PT present to discuss status and monitor for possible knee pain on Lt per Pt report      Lumbar Exercises: Seated   Sit to Stand 5 reps    Sit to Stand Limitations VCs for hip hinge and knee control    Other Seated Lumbar Exercises 3-way raises 1lb x 5 each way      Lumbar Exercises: Supine   Clam 10 reps    Clam Limitations red loop band    Bent Knee Raise 20 reps    Bridge with clamshell 10 reps    Bridge with Ball Squeeze Limitations PT TC and VC to maintain TA draw-in vs doming      Lumbar Exercises: Sidelying   Clam Both;10 reps    Clam Limitations green loop band, reach top hip long to stack hips      Shoulder Exercises: Supine   Horizontal ABduction Strengthening;Both;10 reps    Theraband Level (Shoulder Horizontal ABduction) Level 2 (Red)    Diagonals Limitations red band draw sword 1x10 each with TA and ribcage knitting cue  PT Education - 12/25/20 1445    Education Details Access Code: [email or web address]    Person(s) Educated Patient    Methods Explanation;Handout    Comprehension Verbalized understanding;Returned demonstration            PT Short Term Goals - 12/25/20 1719      PT SHORT TERM GOAL #1   Title Pt will be ind with initial HEP    Baseline working on compliance    Status On-going      PT SHORT TERM GOAL #2   Title Pt will demo proper activation of deep core with good breath control    Status On-going      PT SHORT TERM GOAL #3   Title Pt will demo proper body mechanics with bed mobility and chair transfers without pain    Status Achieved             PT Long Term Goals - 11/28/20 1305      PT LONG TERM GOAL #1   Title Pt will be ind with advanced HEP and walking program with pain reduced by at least 75%    Time 8    Period Weeks    Status New    Target Date 01/23/21      PT LONG TERM GOAL #2   Title Pt will be able to  perform light standing household tasks for up to 30 min with min to no pain.    Time 8    Period Weeks    Status New    Target Date 01/23/21      PT LONG TERM GOAL #3   Title Pt will demo proper bend, lift and carry x 20' of 10lb box with good core and body mechanics to simulate grocery and laundry tasks.    Time 8    Period Weeks    Status New    Target Date 01/23/21      PT LONG TERM GOAL #4   Title Pt will achieve Lt hip ROM symmetrical with Rt to demo improved flexiblity and reduce undue strain on spine.    Time 8    Period Weeks    Status New    Target Date 01/23/21                 Plan - 12/25/20 1715    Clinical Impression Statement Pt continues to be painfree and interested in working on core strength.  She has been busy and therefore not as compliant with HEP as she wants to be.  Pt has trouble activating core without holding breath so PT cued exhale to assist with initial activation and exhale on effort with seated sit to stand and 3-way raises today.  She connects well in sitting to cue to imagine string on top of her head pulling her taller.  She has good activation of deep mutlifidi bil lumbar with bridge and seated 3-way raises.  She is somewhat limited by bil knee pain so reps of sit to stand were kept to a minimum for body mechanics review only.  PT used UE ther ex in supine and sitting to highlight how trunk/core are used to stabilize.  PT updated HEP to reflect ther ex today.    Comorbidities osteopenia    Rehab Potential Excellent    PT Frequency 2x / week    PT Duration 8 weeks    PT Treatment/Interventions ADLs/Self Care Home Management;Moist Heat;Cryotherapy;Neuromuscular re-education;Therapeutic exercise;Functional mobility training;Therapeutic activities;Manual techniques;Patient/family education;Passive range of motion;Dry needling;Joint  Manipulations;Spinal Manipulations    PT Next Visit Plan review new ther ex from last time, try limited standing for  postural re-ed/core awareness    PT Home Exercise Plan [email or web address]    Consulted and Agree with Plan of Care Patient           Patient will benefit from skilled therapeutic intervention in order to improve the following deficits and impairments:     Visit Diagnosis: Chronic midline low back pain without sciatica  Muscle weakness (generalized)     Problem List Patient Active Problem List   Diagnosis Date Noted  . Postsurgical hypothyroidism 11/04/2012  . Multiple thyroid nodules 12/19/2011    Morton Peters, PT 12/25/20 5:20 PM   [facility] [address], [address] [place], [place], [address] Phone: [phone]   Fax:  [phone]  Name: BERTRICE LEDER MRN: [id number] Date of Birth: 01-08-1951

## 2020-12-25 NOTE — Patient Instructions (Signed)
Access Code: [email or web address] URL: [email or web address] Date: 12/25/2020 Prepared by: Venetia Night Brunetta Newingham  Exercises Supine Hamstring Stretch - 1 x daily - 7 x weekly - 1 sets - 3 reps - 30 sec hold Supine Figure 4 Piriformis Stretch - 1 x daily - 7 x weekly - 1 sets - 3 reps - 30 sec hold Supine Piriformis Stretch with Foot on Ground - 1 x daily - 7 x weekly - 1 sets - 3 reps - 30 sec hold Supine Butterfly Groin Stretch - 1 x daily - 7 x weekly - 1 sets - 3 reps - 30 sec hold Sidelying Transversus Abdominis Bracing - 1 x daily - 7 x weekly - 10 reps Supine Transversus Abdominis Bracing with Double Leg Fallout - 1 x daily - 7 x weekly - 3 sets - 10 reps Supine Bridge with Resistance Band - 1 x daily - 7 x weekly - 2 sets - 10 reps - 3 hold Clamshell with Resistance - 1 x daily - 7 x weekly - 2 sets - 15 reps Sit to Stand with Hands on Knees - 1 x daily - 7 x weekly - 1 sets - 5 reps Supine Shoulder Horizontal Abduction with Resistance - 1 x daily - 7 x weekly - 1 sets - 10 reps Supine PNF D2 Flexion with Resistance - 1 x daily - 7 x weekly - 1 sets - 10 reps Seated Shoulder Flexion with Dumbbells - 1 x daily - 7 x weekly - 1 sets - 5 reps

## 2021-01-04 ENCOUNTER — Encounter: Payer: PPO | Admitting: Physical Therapy

## 2021-01-06 DIAGNOSIS — R350 Frequency of micturition: Secondary | ICD-10-CM | POA: Diagnosis not present

## 2021-01-06 DIAGNOSIS — N3001 Acute cystitis with hematuria: Secondary | ICD-10-CM | POA: Diagnosis not present

## 2021-01-06 DIAGNOSIS — R300 Dysuria: Secondary | ICD-10-CM | POA: Diagnosis not present

## 2021-01-08 DIAGNOSIS — H40023 Open angle with borderline findings, high risk, bilateral: Secondary | ICD-10-CM | POA: Diagnosis not present

## 2021-01-09 ENCOUNTER — Encounter: Payer: PPO | Admitting: Physical Therapy

## 2021-01-13 DIAGNOSIS — G8929 Other chronic pain: Secondary | ICD-10-CM | POA: Diagnosis not present

## 2021-01-13 DIAGNOSIS — K219 Gastro-esophageal reflux disease without esophagitis: Secondary | ICD-10-CM | POA: Diagnosis not present

## 2021-01-13 DIAGNOSIS — E039 Hypothyroidism, unspecified: Secondary | ICD-10-CM | POA: Diagnosis not present

## 2021-01-13 DIAGNOSIS — E785 Hyperlipidemia, unspecified: Secondary | ICD-10-CM | POA: Diagnosis not present

## 2021-01-13 DIAGNOSIS — M810 Age-related osteoporosis without current pathological fracture: Secondary | ICD-10-CM | POA: Diagnosis not present

## 2021-01-13 DIAGNOSIS — E7800 Pure hypercholesterolemia, unspecified: Secondary | ICD-10-CM | POA: Diagnosis not present

## 2021-01-16 ENCOUNTER — Ambulatory Visit: Payer: PPO | Attending: Family Medicine | Admitting: Physical Therapy

## 2021-01-16 ENCOUNTER — Encounter: Payer: Self-pay | Admitting: Physical Therapy

## 2021-01-16 ENCOUNTER — Other Ambulatory Visit: Payer: Self-pay

## 2021-01-16 DIAGNOSIS — M545 Low back pain, unspecified: Secondary | ICD-10-CM | POA: Diagnosis not present

## 2021-01-16 DIAGNOSIS — M6281 Muscle weakness (generalized): Secondary | ICD-10-CM

## 2021-01-16 DIAGNOSIS — G8929 Other chronic pain: Secondary | ICD-10-CM | POA: Diagnosis not present

## 2021-01-16 NOTE — Therapy (Signed)
[facility] [address] [place], [place], [address] Phone: [phone]   Fax:  [phone]  Physical Therapy Treatment  Patient Details  Name: Kimberly Ryan MRN: [id number] Date of Birth: [birth year] Referring Provider (PT): Marda Stalker, [place]   Encounter Date: 01/16/2021   PT End of Session - 01/16/21 1104    Visit Number 5    Date for PT Re-Evaluation 01/23/21    Authorization Type [employer]    Progress Note Due on Visit 10    PT Start Time 1102    PT Stop Time 1146    PT Time Calculation (min) 44 min    Activity Tolerance Patient tolerated treatment well;No increased pain    Behavior During Therapy WFL for tasks assessed/performed           Past Medical History:  Diagnosis Date  . Anxiety   . Arthritis   . GERD (gastroesophageal reflux disease)   . Headache(784.0)   . Shortness of breath    with exertion   . Thyroid disease     Past Surgical History:  Procedure Laterality Date  . MYOMECTOMY  2000  . RHINOPLASTY  1953  . THYROID LOBECTOMY  08/17/2012   Procedure: THYROID LOBECTOMY;  Surgeon: [name], MD,FACS;  Location: [facility];  Service: General;  Laterality: Left;  Left Thyroid Lobectomy  . TONSILLECTOMY  1954    There were no vitals filed for this visit.   Subjective Assessment - 01/16/21 1106    Subjective Doing well, no pain right. I feel almost 100%.    Pertinent History osteopenia    Currently in Pain? No/denies    Multiple Pain Sites No                             [facility] Adult PT Treatment/Exercise - 01/16/21 0001      Lumbar Exercises: Aerobic   Nustep L3 x 6' PTA resent to discuss status and monitor for possible knee pain on Lt per Pt report      Lumbar Exercises: Seated   Other Seated Lumbar Exercises Shoulder flexion & scaption 90degrees   10x each     Knee/Hip Exercises: Standing   Hip Abduction Stengthening;Both;2  sets;10 reps;Knee straight      Knee/Hip Exercises: Seated   Sit to Sand --   3x5 VC for > glute recruitment/ more hinge at hips: improved                   PT Short Term Goals - 12/25/20 1719      PT SHORT TERM GOAL #1   Title Pt will be ind with initial HEP    Baseline working on compliance    Status On-going      PT SHORT TERM GOAL #2   Title Pt will demo proper activation of deep core with good breath control    Status On-going      PT SHORT TERM GOAL #3   Title Pt will demo proper body mechanics with bed mobility and chair transfers without pain    Status Achieved             PT Long Term Goals - 11/28/20 1305      PT LONG TERM GOAL #1   Title Pt will be ind with advanced HEP and walking program with pain reduced by at least 75%    Time 8  Period Weeks    Status New    Target Date 01/23/21      PT LONG TERM GOAL #2   Title Pt will be able to perform light standing household tasks for up to 30 min with min to no pain.    Time 8    Period Weeks    Status New    Target Date 01/23/21      PT LONG TERM GOAL #3   Title Pt will demo proper bend, lift and carry x 20' of 10lb box with good core and body mechanics to simulate grocery and laundry tasks.    Time 8    Period Weeks    Status New    Target Date 01/23/21      PT LONG TERM GOAL #4   Title Pt will achieve Lt hip ROM symmetrical with Rt to demo improved flexiblity and reduce undue strain on spine.    Time 8    Period Weeks    Status New    Target Date 01/23/21                 Plan - 01/16/21 1109    Clinical Impression Statement Pt reports she is "100% improved regarding the pain she originally came in for." She has improved her core activation but still demonstrates hip weakness, especially in sit to stand. ( it did improve as we practiced during todays session.) Pt is probably semi-compliant with HEP. "when I don't hurt I don't think about doing them." PTA encouraged pt to try and  sprinkle some exercises into her already established habits. Pt agreed to try this technique.    Personal Factors and Comorbidities Comorbidity 1    Comorbidities osteopenia    Examination-Activity Limitations Bend;Lift;Stand;Bed Mobility;Transfers    Examination-Participation Restrictions Cleaning;Community Activity;Shop;Laundry;Yard Work    Stability/Clinical Decision Making Stable/Uncomplicated    Rehab Potential Excellent    PT Frequency 2x / week    PT Duration 8 weeks    PT Treatment/Interventions ADLs/Self Care Home Management;Moist Heat;Cryotherapy;Neuromuscular re-education;Therapeutic exercise;Functional mobility training;Therapeutic activities;Manual techniques;Patient/family education;Passive range of motion;Dry needling;Joint Manipulations;Spinal Manipulations    PT Next Visit Plan Re-assess next sesison. Pt is 50/50 about DC vs continue.    PT Home Exercise Plan [email or web address]    Consulted and Agree with Plan of Care Patient           Patient will benefit from skilled therapeutic intervention in order to improve the following deficits and impairments:  Decreased range of motion,Pain,Postural dysfunction,Decreased strength,Improper body mechanics,Impaired flexibility,Hypomobility  Visit Diagnosis: Chronic midline low back pain without sciatica  Muscle weakness (generalized)     Problem List Patient Active Problem List   Diagnosis Date Noted  . Postsurgical hypothyroidism 11/04/2012  . Multiple thyroid nodules 12/19/2011    Harika Laidlaw, PTA 01/16/2021, 11:47 AM  [facility] [address] [place], [place], [address] Phone: [phone]   Fax:  [phone]  Name: ANISSA ABBS MRN: [id number] Date of Birth: [birth year]

## 2021-01-18 DIAGNOSIS — M810 Age-related osteoporosis without current pathological fracture: Secondary | ICD-10-CM | POA: Diagnosis not present

## 2021-01-21 ENCOUNTER — Encounter: Payer: Self-pay | Admitting: Physical Therapy

## 2021-01-21 ENCOUNTER — Other Ambulatory Visit: Payer: Self-pay

## 2021-01-21 ENCOUNTER — Ambulatory Visit: Payer: PPO | Admitting: Physical Therapy

## 2021-01-21 DIAGNOSIS — G8929 Other chronic pain: Secondary | ICD-10-CM

## 2021-01-21 DIAGNOSIS — M6281 Muscle weakness (generalized): Secondary | ICD-10-CM

## 2021-01-21 DIAGNOSIS — M545 Low back pain, unspecified: Secondary | ICD-10-CM

## 2021-01-21 NOTE — Therapy (Signed)
[facility] [address], [address] [place], [place], [address] Phone: [phone]   Fax:  [phone]  Physical Therapy Treatment  Patient Details  Name: Kimberly Ryan MRN: [id number] Date of Birth: 1951/09/04 Referring Provider (PT): Jarrett Soho, [place]   Encounter Date: 01/21/2021   PT End of Session - 01/21/21 1103    Visit Number 6    Date for PT Re-Evaluation 01/23/21    Authorization Type [employer]    Progress Note Due on Visit 10    PT Start Time 1103    PT Stop Time 1147    PT Time Calculation (min) 44 min    Activity Tolerance Patient tolerated treatment well;No increased pain    Behavior During Therapy WFL for tasks assessed/performed           Past Medical History:  Diagnosis Date  . Anxiety   . Arthritis   . GERD (gastroesophageal reflux disease)   . Headache(784.0)   . Shortness of breath    with exertion   . Thyroid disease     Past Surgical History:  Procedure Laterality Date  . MYOMECTOMY  2000  . RHINOPLASTY  1953  . THYROID LOBECTOMY  08/17/2012   Procedure: THYROID LOBECTOMY;  Surgeon: Atilano Ina, MD,FACS;  Location: [facility];  Service: General;  Laterality: Left;  Left Thyroid Lobectomy  . TONSILLECTOMY  1954    There were no vitals filed for this visit.   Subjective Assessment - 01/21/21 1104    Subjective No pain in several weeks.  Semi-compliant with HEP due to busy schedule.    Pertinent History osteopenia    Limitations Lifting;Standing;House hold activities;Other (comment)    How long can you stand comfortably? 15-20 min    Patient Stated Goals get stronger, reduce pain with activities    Currently in Pain? No/denies              [facility] PT Assessment - 01/21/21 0001      Assessment   Medical Diagnosis M54.50 (ICD-10-CM) - Low back pain, unspecified    Referring Provider (PT) Jarrett Soho, PA-C    Onset Date/Surgical Date --   6-8 weeks  ago   Hand Dominance Right    Next MD Visit early March    Prior Therapy yes      Observation/Other Assessments   Focus on Therapeutic Outcomes (FOTO)  6% down from 33%      PROM   Overall PROM Comments Lt hip ROM symmetrical to Rt      Flexibility   Hamstrings lacks 10 deg bil    Piriformis WNL      Palpation   Spinal mobility improved spinal ROM to [facility] and no pain                         [facility] Adult PT Treatment/Exercise - 01/21/21 0001      Exercises   Exercises Lumbar;Knee/Hip      Lumbar Exercises: Stretches   Active Hamstring Stretch Right;Left;1 rep;30 seconds    Active Hamstring Stretch Limitations supine    Piriformis Stretch Right;Left;1 rep;30 seconds    Piriformis Stretch Limitations supine    Figure 4 Stretch 1 rep;Supine;With overpressure;30 seconds    Figure 4 Stretch Limitations bil      Lumbar Exercises: Standing   Other Standing Lumbar Exercises counter plank with TA lifts x 10 reps hold 3 sec      Lumbar Exercises:  Supine   Ab Set 5 reps    Clam 10 reps    Clam Limitations bent knee fall out alt Rt/Lt with TA and pelvic stabilization    Bent Knee Raise 10 reps    Bridge 5 reps    Bridge with clamshell 5 reps   red loop     Knee/Hip Exercises: Seated   Sit to Sand 5 reps;without UE support                    PT Short Term Goals - 01/21/21 1106      PT SHORT TERM GOAL #1   Title Pt will be ind with initial HEP    Baseline working on compliance    Status Partially Met      PT SHORT TERM GOAL #2   Title Pt will demo proper activation of deep core with good breath control    Status Achieved      PT SHORT TERM GOAL #3   Title Pt will demo proper body mechanics with bed mobility and chair transfers without pain    Status Achieved             PT Long Term Goals - 01/21/21 1106      PT LONG TERM GOAL #1   Title Pt will be ind with advanced HEP and walking program with pain reduced by at least 75%    Status  Achieved      PT LONG TERM GOAL #2   Title Pt will be able to perform light standing household tasks for up to 30 min with min to no pain.    Status Achieved      PT LONG TERM GOAL #3   Title Pt will demo proper bend, lift and carry x 20' of 10lb box with good core and body mechanics to simulate grocery and laundry tasks.    Status Achieved      PT LONG TERM GOAL #4   Title Pt will achieve Lt hip ROM symmetrical with Rt to demo improved flexiblity and reduce undue strain on spine.    Status Partially Met                 Plan - 01/21/21 1244    Clinical Impression Statement Pt has met all LTGs.  She has not had any pain for several weeks.  FOTO score has improved from 33% limited to 6% limited.  Pt demos proper core activation in static and dynamic postures.  She is able to perform sit to stand without UE assist.  She has the tools to continue improving LE flexibility and core strength through her HEP.  PT reviewed HEP today and encouraged her to perform stretching daily and core work 2-3 times a week cycling through her exercise bank doing at least 3 at a time to get through complete program each week.  Pt understands that she may return with a new PT Rx should she have any set backs or feel she is not progressing with HEP.    Comorbidities osteopenia    PT Frequency 2x / week    PT Duration 8 weeks    PT Treatment/Interventions ADLs/Self Care Home Management;Moist Heat;Cryotherapy;Neuromuscular re-education;Therapeutic exercise;Functional mobility training;Therapeutic activities;Manual techniques;Patient/family education;Passive range of motion;Dry needling;Joint Manipulations;Spinal Manipulations    PT Next Visit Plan d/c to HEP    PT Home Exercise Plan [email or web address]    Consulted and Agree with Plan of Care Patient  Patient will benefit from skilled therapeutic intervention in order to improve the following deficits and impairments:     Visit Diagnosis: Chronic midline  low back pain without sciatica  Muscle weakness (generalized)     Problem List Patient Active Problem List   Diagnosis Date Noted  . Postsurgical hypothyroidism 11/04/2012  . Multiple thyroid nodules 12/19/2011    PHYSICAL THERAPY DISCHARGE SUMMARY  Visits from Start of Care: 6  Current functional level related to goals / functional outcomes: See above   Remaining deficits: See above   Education / Equipment: HEP Plan: Patient agrees to discharge.  Patient goals were met. Patient is being discharged due to meeting the stated rehab goals.  ?????         Baruch Merl, PT 01/21/21 12:50 PM   [facility] [address] [place], [place], [address] Phone: [phone]   Fax:  [phone]  Name: Kimberly Ryan MRN: [id number] Date of Birth: Jun 07, 1951

## 2021-01-25 DIAGNOSIS — M810 Age-related osteoporosis without current pathological fracture: Secondary | ICD-10-CM | POA: Diagnosis not present

## 2021-01-25 DIAGNOSIS — G8929 Other chronic pain: Secondary | ICD-10-CM | POA: Diagnosis not present

## 2021-01-25 DIAGNOSIS — E039 Hypothyroidism, unspecified: Secondary | ICD-10-CM | POA: Diagnosis not present

## 2021-01-25 DIAGNOSIS — E785 Hyperlipidemia, unspecified: Secondary | ICD-10-CM | POA: Diagnosis not present

## 2021-01-25 DIAGNOSIS — K219 Gastro-esophageal reflux disease without esophagitis: Secondary | ICD-10-CM | POA: Diagnosis not present

## 2021-01-25 DIAGNOSIS — E7800 Pure hypercholesterolemia, unspecified: Secondary | ICD-10-CM | POA: Diagnosis not present

## 2021-03-18 DIAGNOSIS — G8929 Other chronic pain: Secondary | ICD-10-CM | POA: Diagnosis not present

## 2021-03-18 DIAGNOSIS — M810 Age-related osteoporosis without current pathological fracture: Secondary | ICD-10-CM | POA: Diagnosis not present

## 2021-03-18 DIAGNOSIS — K219 Gastro-esophageal reflux disease without esophagitis: Secondary | ICD-10-CM | POA: Diagnosis not present

## 2021-03-18 DIAGNOSIS — E039 Hypothyroidism, unspecified: Secondary | ICD-10-CM | POA: Diagnosis not present

## 2021-03-18 DIAGNOSIS — E7800 Pure hypercholesterolemia, unspecified: Secondary | ICD-10-CM | POA: Diagnosis not present

## 2021-03-18 DIAGNOSIS — E785 Hyperlipidemia, unspecified: Secondary | ICD-10-CM | POA: Diagnosis not present

## 2021-03-21 DIAGNOSIS — M810 Age-related osteoporosis without current pathological fracture: Secondary | ICD-10-CM | POA: Diagnosis not present

## 2021-03-21 DIAGNOSIS — E559 Vitamin D deficiency, unspecified: Secondary | ICD-10-CM | POA: Diagnosis not present

## 2021-03-21 DIAGNOSIS — E890 Postprocedural hypothyroidism: Secondary | ICD-10-CM | POA: Diagnosis not present

## 2021-04-08 DIAGNOSIS — M810 Age-related osteoporosis without current pathological fracture: Secondary | ICD-10-CM | POA: Diagnosis not present

## 2021-04-08 DIAGNOSIS — E890 Postprocedural hypothyroidism: Secondary | ICD-10-CM | POA: Diagnosis not present

## 2021-04-08 DIAGNOSIS — E559 Vitamin D deficiency, unspecified: Secondary | ICD-10-CM | POA: Diagnosis not present

## 2021-05-16 DIAGNOSIS — E785 Hyperlipidemia, unspecified: Secondary | ICD-10-CM | POA: Diagnosis not present

## 2021-05-16 DIAGNOSIS — E039 Hypothyroidism, unspecified: Secondary | ICD-10-CM | POA: Diagnosis not present

## 2021-05-16 DIAGNOSIS — G8929 Other chronic pain: Secondary | ICD-10-CM | POA: Diagnosis not present

## 2021-05-16 DIAGNOSIS — E7800 Pure hypercholesterolemia, unspecified: Secondary | ICD-10-CM | POA: Diagnosis not present

## 2021-05-16 DIAGNOSIS — M810 Age-related osteoporosis without current pathological fracture: Secondary | ICD-10-CM | POA: Diagnosis not present

## 2021-05-16 DIAGNOSIS — K219 Gastro-esophageal reflux disease without esophagitis: Secondary | ICD-10-CM | POA: Diagnosis not present

## 2021-06-18 DIAGNOSIS — Z1231 Encounter for screening mammogram for malignant neoplasm of breast: Secondary | ICD-10-CM | POA: Diagnosis not present

## 2021-06-18 DIAGNOSIS — M85851 Other specified disorders of bone density and structure, right thigh: Secondary | ICD-10-CM | POA: Diagnosis not present

## 2021-07-08 DIAGNOSIS — S00462A Insect bite (nonvenomous) of left ear, initial encounter: Secondary | ICD-10-CM | POA: Diagnosis not present

## 2021-07-08 DIAGNOSIS — W57XXXA Bitten or stung by nonvenomous insect and other nonvenomous arthropods, initial encounter: Secondary | ICD-10-CM | POA: Diagnosis not present

## 2021-07-09 DIAGNOSIS — H40023 Open angle with borderline findings, high risk, bilateral: Secondary | ICD-10-CM | POA: Diagnosis not present

## 2021-07-09 DIAGNOSIS — H353132 Nonexudative age-related macular degeneration, bilateral, intermediate dry stage: Secondary | ICD-10-CM | POA: Diagnosis not present

## 2021-07-23 DIAGNOSIS — M810 Age-related osteoporosis without current pathological fracture: Secondary | ICD-10-CM | POA: Diagnosis not present

## 2021-07-23 DIAGNOSIS — E890 Postprocedural hypothyroidism: Secondary | ICD-10-CM | POA: Diagnosis not present

## 2021-07-23 DIAGNOSIS — E559 Vitamin D deficiency, unspecified: Secondary | ICD-10-CM | POA: Diagnosis not present

## 2021-08-12 DIAGNOSIS — M810 Age-related osteoporosis without current pathological fracture: Secondary | ICD-10-CM | POA: Diagnosis not present

## 2021-08-12 DIAGNOSIS — G8929 Other chronic pain: Secondary | ICD-10-CM | POA: Diagnosis not present

## 2021-08-12 DIAGNOSIS — E039 Hypothyroidism, unspecified: Secondary | ICD-10-CM | POA: Diagnosis not present

## 2021-08-12 DIAGNOSIS — K219 Gastro-esophageal reflux disease without esophagitis: Secondary | ICD-10-CM | POA: Diagnosis not present

## 2021-08-12 DIAGNOSIS — E785 Hyperlipidemia, unspecified: Secondary | ICD-10-CM | POA: Diagnosis not present

## 2021-08-12 DIAGNOSIS — E7800 Pure hypercholesterolemia, unspecified: Secondary | ICD-10-CM | POA: Diagnosis not present

## 2021-08-30 DIAGNOSIS — G479 Sleep disorder, unspecified: Secondary | ICD-10-CM | POA: Diagnosis not present

## 2021-08-30 DIAGNOSIS — E039 Hypothyroidism, unspecified: Secondary | ICD-10-CM | POA: Diagnosis not present

## 2021-08-30 DIAGNOSIS — R7303 Prediabetes: Secondary | ICD-10-CM | POA: Diagnosis not present

## 2021-08-30 DIAGNOSIS — Z0000 Encounter for general adult medical examination without abnormal findings: Secondary | ICD-10-CM | POA: Diagnosis not present

## 2021-08-30 DIAGNOSIS — E559 Vitamin D deficiency, unspecified: Secondary | ICD-10-CM | POA: Diagnosis not present

## 2021-08-30 DIAGNOSIS — F419 Anxiety disorder, unspecified: Secondary | ICD-10-CM | POA: Diagnosis not present

## 2021-08-30 DIAGNOSIS — E041 Nontoxic single thyroid nodule: Secondary | ICD-10-CM | POA: Diagnosis not present

## 2021-08-30 DIAGNOSIS — E7800 Pure hypercholesterolemia, unspecified: Secondary | ICD-10-CM | POA: Diagnosis not present

## 2021-09-02 DIAGNOSIS — E039 Hypothyroidism, unspecified: Secondary | ICD-10-CM | POA: Diagnosis not present

## 2021-09-02 DIAGNOSIS — Z0000 Encounter for general adult medical examination without abnormal findings: Secondary | ICD-10-CM | POA: Diagnosis not present

## 2021-09-02 DIAGNOSIS — R7303 Prediabetes: Secondary | ICD-10-CM | POA: Diagnosis not present

## 2021-09-02 DIAGNOSIS — E7800 Pure hypercholesterolemia, unspecified: Secondary | ICD-10-CM | POA: Diagnosis not present

## 2021-09-02 DIAGNOSIS — G479 Sleep disorder, unspecified: Secondary | ICD-10-CM | POA: Diagnosis not present

## 2021-09-02 DIAGNOSIS — E559 Vitamin D deficiency, unspecified: Secondary | ICD-10-CM | POA: Diagnosis not present

## 2021-09-02 DIAGNOSIS — E041 Nontoxic single thyroid nodule: Secondary | ICD-10-CM | POA: Diagnosis not present

## 2021-09-02 DIAGNOSIS — F419 Anxiety disorder, unspecified: Secondary | ICD-10-CM | POA: Diagnosis not present

## 2021-09-26 DIAGNOSIS — Z23 Encounter for immunization: Secondary | ICD-10-CM | POA: Diagnosis not present

## 2021-09-26 DIAGNOSIS — L570 Actinic keratosis: Secondary | ICD-10-CM | POA: Diagnosis not present

## 2021-10-28 DIAGNOSIS — S51812A Laceration without foreign body of left forearm, initial encounter: Secondary | ICD-10-CM | POA: Diagnosis not present

## 2021-10-28 DIAGNOSIS — R7309 Other abnormal glucose: Secondary | ICD-10-CM | POA: Diagnosis not present

## 2021-10-28 DIAGNOSIS — E039 Hypothyroidism, unspecified: Secondary | ICD-10-CM | POA: Diagnosis not present

## 2021-10-28 DIAGNOSIS — Z23 Encounter for immunization: Secondary | ICD-10-CM | POA: Diagnosis not present

## 2021-11-12 DIAGNOSIS — R058 Other specified cough: Secondary | ICD-10-CM | POA: Diagnosis not present

## 2021-11-12 DIAGNOSIS — Z6832 Body mass index (BMI) 32.0-32.9, adult: Secondary | ICD-10-CM | POA: Diagnosis not present

## 2021-11-21 DIAGNOSIS — Z86007 Personal history of in-situ neoplasm of skin: Secondary | ICD-10-CM | POA: Diagnosis not present

## 2021-11-21 DIAGNOSIS — L821 Other seborrheic keratosis: Secondary | ICD-10-CM | POA: Diagnosis not present

## 2021-11-21 DIAGNOSIS — L578 Other skin changes due to chronic exposure to nonionizing radiation: Secondary | ICD-10-CM | POA: Diagnosis not present

## 2021-11-21 DIAGNOSIS — D235 Other benign neoplasm of skin of trunk: Secondary | ICD-10-CM | POA: Diagnosis not present

## 2021-11-21 DIAGNOSIS — L570 Actinic keratosis: Secondary | ICD-10-CM | POA: Diagnosis not present

## 2021-12-03 DIAGNOSIS — M1711 Unilateral primary osteoarthritis, right knee: Secondary | ICD-10-CM | POA: Diagnosis not present

## 2021-12-03 DIAGNOSIS — M170 Bilateral primary osteoarthritis of knee: Secondary | ICD-10-CM | POA: Diagnosis not present

## 2021-12-19 DIAGNOSIS — H40023 Open angle with borderline findings, high risk, bilateral: Secondary | ICD-10-CM | POA: Diagnosis not present

## 2021-12-20 DIAGNOSIS — M47816 Spondylosis without myelopathy or radiculopathy, lumbar region: Secondary | ICD-10-CM | POA: Diagnosis not present

## 2021-12-20 DIAGNOSIS — M5451 Vertebrogenic low back pain: Secondary | ICD-10-CM | POA: Diagnosis not present

## 2021-12-20 DIAGNOSIS — M4316 Spondylolisthesis, lumbar region: Secondary | ICD-10-CM | POA: Diagnosis not present

## 2021-12-23 ENCOUNTER — Encounter: Payer: Self-pay | Admitting: Physical Therapy

## 2021-12-23 ENCOUNTER — Ambulatory Visit: Payer: PPO | Attending: Sports Medicine | Admitting: Physical Therapy

## 2021-12-23 ENCOUNTER — Other Ambulatory Visit: Payer: Self-pay

## 2021-12-23 DIAGNOSIS — R269 Unspecified abnormalities of gait and mobility: Secondary | ICD-10-CM

## 2021-12-23 DIAGNOSIS — M6281 Muscle weakness (generalized): Secondary | ICD-10-CM

## 2021-12-23 DIAGNOSIS — M25561 Pain in right knee: Secondary | ICD-10-CM | POA: Insufficient documentation

## 2021-12-23 DIAGNOSIS — M4316 Spondylolisthesis, lumbar region: Secondary | ICD-10-CM | POA: Insufficient documentation

## 2021-12-23 DIAGNOSIS — M47816 Spondylosis without myelopathy or radiculopathy, lumbar region: Secondary | ICD-10-CM | POA: Insufficient documentation

## 2021-12-23 DIAGNOSIS — R293 Abnormal posture: Secondary | ICD-10-CM

## 2021-12-23 DIAGNOSIS — M25562 Pain in left knee: Secondary | ICD-10-CM | POA: Insufficient documentation

## 2021-12-23 DIAGNOSIS — M545 Low back pain, unspecified: Secondary | ICD-10-CM

## 2021-12-23 DIAGNOSIS — M5451 Vertebrogenic low back pain: Secondary | ICD-10-CM | POA: Diagnosis not present

## 2021-12-23 NOTE — Therapy (Signed)
[facility] @ [place], [place], [address] Phone: [phone]   Fax:  [phone]  Physical Therapy Evaluation  Patient Details  Name: Kimberly Ryan MRN: [id number] Date of Birth: [birth year] Referring Provider (PT): [name], MD   Encounter Date: 12/23/2021   PT End of Session - 12/23/21 1227     Visit Number 1    Date for PT Re-Evaluation 02/17/22    Authorization Type [employer]    Progress Note Due on Visit 10    PT Start Time 1147    PT Stop Time 1228    PT Time Calculation (min) 41 min    Activity Tolerance Patient tolerated treatment well;No increased pain    Behavior During Therapy WFL for tasks assessed/performed             Past Medical History:  Diagnosis Date   Anxiety    Arthritis    GERD (gastroesophageal reflux disease)    Headache(784.0)    Shortness of breath    with exertion    Thyroid disease     Past Surgical History:  Procedure Laterality Date   MYOMECTOMY  2000   [place]   THYROID LOBECTOMY  08/17/2012   Procedure: THYROID LOBECTOMY;  Surgeon: [name], MD,FACS;  Location: [facility];  Service: General;  Laterality: Left;  Left Thyroid Lobectomy   TONSILLECTOMY  1954    There were no vitals filed for this visit.    Subjective Assessment - 12/23/21 1151     Subjective Pt reports she has had Rt low back pain worse with waking after sleeping throughout the night and will improve with mobility most of the time but sometimes this doesn't help. Pt reports 8/10 at worst and fairly momentary, 3-4/10 after this and with mobility.    Pertinent History had x-ray with Dr. [name] office and per pt report she had 2 vertebras with decreased disc spacing at Rt lower side.    How long can you sit comfortably? no limits    How long can you stand comfortably? no limits    How long can you walk comfortably? no limits usually does 30 min walk every day     Diagnostic tests had x-ray with Dr. [name] office and per pt report she had 2 vertebras with decreased disc spacing at Rt lower side.    Patient Stated Goals to have less pain and strengthen core    Currently in Pain? Yes    Pain Score 4     Pain Location Back    Pain Orientation Right;Lower    Pain Descriptors / Indicators Sharp;Nagging   sharp pain with initial movement in AM, then most pain is nagging   Pain Type Acute pain                [facility] PT Assessment - 12/23/21 0001       Assessment   Medical Diagnosis M54.51 (ICD-10-CM) - Vertebrogenic low back pain    Referring Provider (PT) [name], MD    Onset Date/Surgical Date --   3 weeks   Prior Therapy yes for back pain      Precautions   Precautions None      Restrictions   Weight Bearing Restrictions No      Balance Screen   Has the patient fallen in the past 6 months No    Has the patient had a decrease in activity level because of  a fear of falling?  No    Is the patient reluctant to leave their home because of a fear of falling?  No      Home Ecologist residence    Living Arrangements Spouse/significant other      Prior Function   Level of Independence Independent      Cognition   Overall Cognitive Status Within Functional Limits for tasks assessed      Observation/Other Assessments   Focus on Therapeutic Outcomes (FOTO)  79>80      Sensation   Light Touch Appears Intact      Coordination   Gross Motor Movements are Fluid and Coordinated Yes    Fine Motor Movements are Fluid and Coordinated Yes      Posture/Postural Control   Posture/Postural Control Postural limitations    Postural Limitations Rounded Shoulders;Posterior pelvic tilt      ROM / Strength   AROM / PROM / Strength AROM;Strength      AROM   Overall AROM Comments WFL of bil hips, knee, ankles. decreased bil thoracic and lumbar spine by 25% in flexion and extension and rotation      Strength    Overall Strength Comments Rt hip 3+/5 in all directions with mild 3/10 pain at Rt glute (pt's prior pain area)                        Objective measurements completed on examination: See above findings.                PT Education - 12/23/21 1227     Education Details Pt educated on HEP, exam findings, POC    Person(s) Educated Patient    Methods Explanation;Demonstration;Tactile cues;Verbal cues;Handout    Comprehension Returned demonstration;Verbalized understanding              PT Short Term Goals - 12/23/21 1524       PT SHORT TERM GOAL #1   Title Pt will be ind with initial HEP    Time 4    Period Weeks    Status New    Target Date 01/20/22      PT SHORT TERM GOAL #2   Title pt to demonstrate improved lifting mechanics with body weight without pain for decreased risl of re-injury    Time 4    Period Weeks    Status New    Target Date 01/20/22      PT SHORT TERM GOAL #3   Title pt to demonstrate improved core activiation with functional and strengthening tasks for improved back and pelvic stability at least 75% of the time without holding breath.    Time 4    Period Weeks    Status New    Target Date 01/20/22               PT Long Term Goals - 12/23/21 1525       PT LONG TERM GOAL #1   Title Pt will be ind with advanced HEP    Time 8    Period Weeks    Status New    Target Date 02/17/22      PT LONG TERM GOAL #2   Title pt to tolerate standing for at least 1 hour for improved ability to complete household tasks without pain    Time 8    Period Weeks    Status New    Target Date 02/17/22  PT LONG TERM GOAL #3   Title Pt will demo proper bend, lift and carry x 20' of 10lb box with good core and body mechanics to simulate grocery and laundry tasks.    Time 8    Period Weeks    Status New    Target Date 02/17/22      PT LONG TERM GOAL #4   Title pt to demonstrate at least 4+/5 bil hip strength for improved  functional squats for household tasks.    Time 8    Period Weeks    Target Date 02/17/22      PT LONG TERM GOAL #5   Title pt to report at least 58 on FOTO for improved percieved functional mobility.    Time 8    Period Weeks    Status New    Target Date 02/17/22                    Plan - 12/23/21 1228     Clinical Impression Statement Pt is 71yo female presenting to clinic with 3 week onset of Rt low back/gluteal pain worse in the AM with first waking and then improves with mobility usually. Pt reports she had an x-ray from Dr. Delilah Shan and found decreased vertebra disc spacing however these images pt did not have these to bring in. Pt found to have mild decreased mobility in thoracic and lumbar spine in rotation and flexion and extension and decreased Rt hip strength compared to Lt with Rt being grossly 3+/5 and 4/5 hip abduction; Lt being 4/5 throughout. Pt only had TTP at Rt gluteal at piriformis and surrounding tissue. Pt did not have any (+) SIJ testing. Pt had mild pain with MMT on Rt hip especially in extension. Pt given HEP for stretching and core activiation for initially at home and went over with pt. Pt would benefit from additional PT to further address deficits found.    Personal Factors and Comorbidities Age;Comorbidity 1    Comorbidities previous episodes of low back pain.    Examination-Activity Limitations Transfers;Locomotion Level    Examination-Participation Restrictions Community Activity    Stability/Clinical Decision Making Stable/Uncomplicated    Clinical Decision Making Low    Rehab Potential Good    PT Frequency 2x / week    PT Duration 8 weeks    PT Treatment/Interventions ADLs/Self Care Home Management;Aquatic Therapy;Moist Heat;Functional mobility training;Therapeutic activities;Therapeutic exercise;Balance training;Neuromuscular re-education;Manual techniques;Patient/family education;Scar mobilization;Passive range of motion;Dry needling;Energy  conservation;Stair training    PT Home Exercise Plan [email or web address]    Consulted and Agree with Plan of Care Patient             Patient will benefit from skilled therapeutic intervention in order to improve the following deficits and impairments:  Decreased endurance, Pain, Difficulty walking, Improper body mechanics, Decreased mobility, Impaired flexibility, Postural dysfunction, Decreased strength, Decreased activity tolerance  Visit Diagnosis: Muscle weakness (generalized) - Plan: PT plan of care cert/re-cert  Abnormality of gait and mobility - Plan: PT plan of care cert/re-cert  Abnormal posture - Plan: PT plan of care cert/re-cert  Acute right-sided low back pain without sciatica - Plan: PT plan of care cert/re-cert     Problem List Patient Active Problem List   Diagnosis Date Noted   Postsurgical hypothyroidism 11/04/2012   Multiple thyroid nodules 12/19/2011   Stacy Gardner, PT, DPT 01/09/233:28 PM   [facility] @ [place], [place], [address] Phone: [phone]  Fax:  [phone]  Name: Kimberly Ryan MRN: [id number] Date of Birth: [birth year]

## 2021-12-26 ENCOUNTER — Ambulatory Visit: Payer: PPO | Admitting: Physical Therapy

## 2021-12-26 ENCOUNTER — Other Ambulatory Visit: Payer: Self-pay

## 2021-12-26 DIAGNOSIS — R293 Abnormal posture: Secondary | ICD-10-CM

## 2021-12-26 DIAGNOSIS — R269 Unspecified abnormalities of gait and mobility: Secondary | ICD-10-CM

## 2021-12-26 DIAGNOSIS — M4316 Spondylolisthesis, lumbar region: Secondary | ICD-10-CM | POA: Diagnosis not present

## 2021-12-26 DIAGNOSIS — M545 Low back pain, unspecified: Secondary | ICD-10-CM

## 2021-12-26 DIAGNOSIS — M6281 Muscle weakness (generalized): Secondary | ICD-10-CM

## 2021-12-26 NOTE — Therapy (Signed)
[facility] @ [place], [place], [address] Phone: [phone]   Fax:  [phone]  Physical Therapy Treatment  Patient Details  Name: Kimberly Ryan MRN: [id number] Date of Birth: 05/15/1951 Referring Provider (PT): [name], MD   Encounter Date: 12/26/2021   PT End of Session - 12/26/21 1610     Visit Number 2    Date for PT Re-Evaluation 02/17/22    Authorization Type [employer]    Progress Note Due on Visit 10    PT Start Time 3235    PT Stop Time 5732    PT Time Calculation (min) 42 min    Activity Tolerance Patient tolerated treatment well             Past Medical History:  Diagnosis Date   Anxiety    Arthritis    GERD (gastroesophageal reflux disease)    Headache(784.0)    Shortness of breath    with exertion    Thyroid disease     Past Surgical History:  Procedure Laterality Date   MYOMECTOMY  2000   [place]   THYROID LOBECTOMY  08/17/2012   Procedure: THYROID LOBECTOMY;  Surgeon: [name], MD,FACS;  Location: [facility];  Service: General;  Laterality: Left;  Left Thyroid Lobectomy   TONSILLECTOMY  1954    There were no vitals filed for this visit.   Subjective Assessment - 12/26/21 1405     Subjective The pain hasn't gone away but the pain has changed.  It's moved spots.  I came here last year for back strengthening.  I started clamshells and wonder if that strained the muscle.    Pertinent History had x-ray with Dr. [name] office and per pt report she had 2 vertebras with decreased disc spacing at Rt lower side.    How long can you walk comfortably? no limits usually does 30 min walk every day    Diagnostic tests had x-ray with Dr. [name] office and per pt report she had 2 vertebras with decreased disc spacing at Rt lower side.    Currently in Pain? Yes    Pain Score 3     Pain Location Back    Pain Orientation Right;Lower    Pain Type Acute  pain    Aggravating Factors  more with transitional movements in/out of the car; turning in bed    Pain Relieving Factors better as the day goes on                               [facility] Adult PT Treatment/Exercise - 12/26/21 0001       Lumbar Exercises: Stretches   Piriformis Stretch Right;Left;1 rep;30 seconds    Piriformis Stretch Limitations seated      Lumbar Exercises: Aerobic   Nustep L1 7 min while discussing status      Lumbar Exercises: Standing   Other Standing Lumbar Exercises red band rows 10x   initially had right glute pain but improved with repetition     Lumbar Exercises: Seated   Other Seated Lumbar Exercises foam roll push down to activate transverse abdominus muscles 5 sec hold 10x      Lumbar Exercises: Supine   Ab Set 5 reps    Isometric Hip Flexion 5 seconds;5 reps    Isometric Hip Flexion Limitations hand to opp knee 5 sec hold    Other Supine  Lumbar Exercises abdominal brace with single leg bent knee fall out 10x right/left      Lumbar Exercises: Sidelying   Clam Right;Left;5 reps      Knee/Hip Exercises: Seated   Sit to Sand 10 reps;without UE support   mat table     Manual Therapy   Manual therapy comments Addaday instrument to gluteals                     PT Education - 12/26/21 1610     Education Details supine abdominal bent knee isometric; sidelying clam; sit to stand, standing red band rows    Person(s) Educated Patient    Methods Explanation;Demonstration;Handout    Comprehension Verbalized understanding;Returned demonstration              PT Short Term Goals - 12/23/21 1524       PT SHORT TERM GOAL #1   Title Pt will be ind with initial HEP    Time 4    Period Weeks    Status New    Target Date 01/20/22      PT SHORT TERM GOAL #2   Title pt to demonstrate improved lifting mechanics with body weight without pain for decreased risl of re-injury    Time 4    Period Weeks    Status New     Target Date 01/20/22      PT SHORT TERM GOAL #3   Title pt to demonstrate improved core activiation with functional and strengthening tasks for improved back and pelvic stability at least 75% of the time without holding breath.    Time 4    Period Weeks    Status New    Target Date 01/20/22               PT Long Term Goals - 12/23/21 1525       PT LONG TERM GOAL #1   Title Pt will be ind with advanced HEP    Time 8    Period Weeks    Status New    Target Date 02/17/22      PT LONG TERM GOAL #2   Title pt to tolerate standing for at least 1 hour for improved ability to complete household tasks without pain    Time 8    Period Weeks    Status New    Target Date 02/17/22      PT LONG TERM GOAL #3   Title Pt will demo proper bend, lift and carry x 20' of 10lb box with good core and body mechanics to simulate grocery and laundry tasks.    Time 8    Period Weeks    Status New    Target Date 02/17/22      PT LONG TERM GOAL #4   Title pt to demonstrate at least 4+/5 bil hip strength for improved functional squats for household tasks.    Time 8    Period Weeks    Target Date 02/17/22      PT LONG TERM GOAL #5   Title pt to report at least 90 on FOTO for improved percieved functional mobility.    Time 8    Period Weeks    Status New    Target Date 02/17/22                   Plan - 12/26/21 1608     Clinical Impression Statement The patient reports no issues with initial  HEP.  Initiated low level core strengthening including transverse abdominus activation in supine, sidelying, with rolling, seated and standing positions.  She reports right posterior/lateral hip pain with right clams and with first few reps of standing rows but it dissipates with repetition.  Good response to Addaday instrument for soft tissue mobilization.  Therapist providing cues particularly for coordinated breathing with ex's to optimize muscle activation.    Personal Factors and  Comorbidities Age;Comorbidity 1    Comorbidities previous episodes of low back pain.    Examination-Participation Restrictions Community Activity    PT Frequency 2x / week    PT Duration 8 weeks    PT Treatment/Interventions ADLs/Self Care Home Management;Aquatic Therapy;Moist Heat;Functional mobility training;Therapeutic activities;Therapeutic exercise;Balance training;Neuromuscular re-education;Manual techniques;Patient/family education;Scar mobilization;Passive range of motion;Dry needling;Energy conservation;Stair training    PT Next Visit Plan review transverse abdominus activation ex's as needed; Nu-Step; red band rows and add standing band shouler extensions; stretching; Addaday to right glutes PRN; may be a candidate for DN if glute pain does not resolve with other interventions    PT Home Exercise Plan [email or web address]             Patient will benefit from skilled therapeutic intervention in order to improve the following deficits and impairments:  Decreased endurance, Pain, Difficulty walking, Improper body mechanics, Decreased mobility, Impaired flexibility, Postural dysfunction, Decreased strength, Decreased activity tolerance  Visit Diagnosis: Muscle weakness (generalized)  Abnormality of gait and mobility  Abnormal posture  Acute right-sided low back pain without sciatica     Problem List Patient Active Problem List   Diagnosis Date Noted   Postsurgical hypothyroidism 11/04/2012   Multiple thyroid nodules 12/19/2011   Ruben Im, PT 12/26/21 5:23 PM Phone: [phone] Fax: [phone]  Alvera Singh, PT 12/26/2021, 5:23 PM  [facility] @ [place], [place], [address] Phone: [phone]   Fax:  [phone]  Name: Kimberly Ryan MRN: [id number] Date of Birth: 08-04-1951

## 2021-12-26 NOTE — Patient Instructions (Signed)
Access Code: [email or web address] URL: [email or web address] Date: 12/26/2021 Prepared by: Ruben Im  Exercises Seated Piriformis Stretch with Trunk Bend - 1 x daily - 7 x weekly - 1 sets - 2-3 reps - 30-45s holds Supine Piriformis Stretch - 1 x daily - 7 x weekly - 1 sets - 2-3 reps - 30-45s holds Seated Hamstring Stretch - 1 x daily - 7 x weekly - 1 sets - 2-3 reps - 30-45s holds Supine Hip Adductor Stretch - 1 x daily - 7 x weekly - 1 sets - 2-3 reps - 30-45s holds Standing Lumbar Spine Flexion Stretch Counter - 1 x daily - 7 x weekly - 1 sets - 2-3 reps - 30-45s holds Hooklying Isometric Hip Flexion - 1 x daily - 7 x weekly - 1 sets - 10 reps - 5 hold Sidelying Transversus Abdominis Bracing - 1 x daily - 7 x weekly - 1 sets - 10 reps Sit to Stand - 1 x daily - 7 x weekly - 1 sets - 10 reps Standing Row with Anchored Resistance - 1 x daily - 7 x weekly - 1 sets - 10 reps

## 2021-12-30 ENCOUNTER — Ambulatory Visit: Payer: PPO

## 2021-12-30 ENCOUNTER — Other Ambulatory Visit: Payer: Self-pay

## 2021-12-30 DIAGNOSIS — M4316 Spondylolisthesis, lumbar region: Secondary | ICD-10-CM | POA: Diagnosis not present

## 2021-12-30 DIAGNOSIS — R293 Abnormal posture: Secondary | ICD-10-CM

## 2021-12-30 DIAGNOSIS — G8929 Other chronic pain: Secondary | ICD-10-CM

## 2021-12-30 DIAGNOSIS — R269 Unspecified abnormalities of gait and mobility: Secondary | ICD-10-CM

## 2021-12-30 DIAGNOSIS — M6281 Muscle weakness (generalized): Secondary | ICD-10-CM

## 2021-12-30 DIAGNOSIS — M545 Low back pain, unspecified: Secondary | ICD-10-CM

## 2021-12-30 NOTE — Therapy (Signed)
[facility] @ [place], [place], [address] Phone: [phone]   Fax:  [phone]  Physical Therapy Treatment  Patient Details  Name: Kimberly Ryan MRN: [id number] Date of Birth: 12-13-1951 Referring Provider (PT): [name], MD   Encounter Date: 12/30/2021   PT End of Session - 12/30/21 1625     Visit Number 3    Date for PT Re-Evaluation 02/17/22    Authorization Type [employer]    Progress Note Due on Visit 10    PT Start Time 0334    PT Stop Time 0417    PT Time Calculation (min) 43 min    Activity Tolerance Patient tolerated treatment well    Behavior During Therapy [facility] for tasks assessed/performed             Past Medical History:  Diagnosis Date   Anxiety    Arthritis    GERD (gastroesophageal reflux disease)    Headache(784.0)    Shortness of breath    with exertion    Thyroid disease     Past Surgical History:  Procedure Laterality Date   MYOMECTOMY  2000   [place]   THYROID LOBECTOMY  08/17/2012   Procedure: THYROID LOBECTOMY;  Surgeon: [name], MD,FACS;  Location: [facility];  Service: General;  Laterality: Left;  Left Thyroid Lobectomy   TONSILLECTOMY  1954    There were no vitals filed for this visit.   Subjective Assessment - 12/30/21 1538     Subjective Pt states that she is overall the same. She is currently in 0/10 pain and reports the only time she has pain is in the morning; she states the pain typically lasts only several minutes. She may have occasional times when she turns certain ways during the day that will cause temporary pain.    Pertinent History had x-ray with Dr. [name] office and per pt report she had 2 vertebras with decreased disc spacing at Rt lower side.    How long can you sit comfortably? no limits    How long can you stand comfortably? no limits    How long can you walk comfortably? no limits usually does 30 min walk  every day    Currently in Pain? No/denies    Multiple Pain Sites No                               [facility] Adult PT Treatment/Exercise - 12/30/21 0001       Bed Mobility   Bed Mobility Rolling Left;Left Sidelying to Sit   Pt education performed on breath coordination and pressure management throughout these activities to help reduce LBP in the morning     Lumbar Exercises: Stretches   Piriformis Stretch Right;Left;1 rep;30 seconds    Piriformis Stretch Limitations seated      Lumbar Exercises: Aerobic   Nustep L1 7 min while discussing status      Lumbar Exercises: Supine   Isometric Hip Flexion 5 seconds;5 reps    Other Supine Lumbar Exercises SKTC 10x; LTR 10x - instructed to perform both of these exercises in the morning before getting out of bed      Lumbar Exercises: Sidelying   Clam Right;Left;15 reps      Knee/Hip Exercises: Seated   Sit to Sand 10 reps;without UE support      Manual Therapy   Manual therapy comments  DN to Rt gluteals with STM to assess the area and further reduce tension after              Trigger Point Dry Needling - 12/30/21 0001     Consent Given? Yes    Other Dry Needling Rt gluteals and L5/S1 multifidi                   PT Education - 12/30/21 1623     Education Details Pt educatio nperformed on lower trunk rotation and single kne eto chest before getting out of bed in the morning to help reduce pain; we went over appropriate body form for sidelying>sit and rolling in bed with breath coordination; encouraged to perform exercise sfor pain relief in general, especially if she notices soreness from dry needling.    Person(s) Educated Patient    Methods Explanation;Tactile cues;Verbal cues;Handout    Comprehension Verbalized understanding              PT Short Term Goals - 12/23/21 1524       PT SHORT TERM GOAL #1   Title Pt will be ind with initial HEP    Time 4    Period Weeks    Status New     Target Date 01/20/22      PT SHORT TERM GOAL #2   Title pt to demonstrate improved lifting mechanics with body weight without pain for decreased risl of re-injury    Time 4    Period Weeks    Status New    Target Date 01/20/22      PT SHORT TERM GOAL #3   Title pt to demonstrate improved core activiation with functional and strengthening tasks for improved back and pelvic stability at least 75% of the time without holding breath.    Time 4    Period Weeks    Status New    Target Date 01/20/22               PT Long Term Goals - 12/23/21 1525       PT LONG TERM GOAL #1   Title Pt will be ind with advanced HEP    Time 8    Period Weeks    Status New    Target Date 02/17/22      PT LONG TERM GOAL #2   Title pt to tolerate standing for at least 1 hour for improved ability to complete household tasks without pain    Time 8    Period Weeks    Status New    Target Date 02/17/22      PT LONG TERM GOAL #3   Title Pt will demo proper bend, lift and carry x 20' of 10lb box with good core and body mechanics to simulate grocery and laundry tasks.    Time 8    Period Weeks    Status New    Target Date 02/17/22      PT LONG TERM GOAL #4   Title pt to demonstrate at least 4+/5 bil hip strength for improved functional squats for household tasks.    Time 8    Period Weeks    Target Date 02/17/22      PT LONG TERM GOAL #5   Title pt to report at least 51 on FOTO for improved percieved functional mobility.    Time 8    Period Weeks    Status New    Target Date 02/17/22  Plan - 12/30/21 1626     Clinical Impression Statement Pt tolerated today's treatment session well demosntrated by no increase in pain throughout. No exercise progressions due to time spent on manual therapy and introducing dry needling to Rt gluteals. Pt demonstrated appropriate response to dry needling with mild achiness and residual soreness. She was educated that movement and  HEP are the best thing to reduce residual soreness. We discussed specific performance of lower trunk rotations and single knee to chest in the morning to help reduce pain in addition to good bed mobility mechanics. Bil clams and sit<>stands performed after manual techniques in order to encourage improved posterolateral hip muscle facilitation with good tolerance indicated by no Rt hip pain. She will continue to benefit from skilled PT intervention in order to progress functional strengthening and reduce morning LBP.    Personal Factors and Comorbidities Age;Comorbidity 1    Comorbidities previous episodes of low back pain.    Examination-Activity Limitations Transfers;Locomotion Level    Examination-Participation Restrictions Community Activity    Stability/Clinical Decision Making Stable/Uncomplicated    Clinical Decision Making Low    Rehab Potential Good    PT Frequency 2x / week    PT Duration 8 weeks    PT Treatment/Interventions ADLs/Self Care Home Management;Aquatic Therapy;Moist Heat;Functional mobility training;Therapeutic activities;Therapeutic exercise;Balance training;Neuromuscular re-education;Manual techniques;Patient/family education;Scar mobilization;Passive range of motion;Dry needling;Energy conservation;Stair training    PT Next Visit Plan review transverse abdominus activation since not addressed during this session; progress exercises to include more functional strengthening like standing shoulder extensions, 3 way kick, sidestepping, and standing march; consider further DN if beneficial.    PT Home Exercise Plan [id number]    Consulted and Agree with Plan of Care Patient             Patient will benefit from skilled therapeutic intervention in order to improve the following deficits and impairments:  Decreased endurance, Pain, Difficulty walking, Improper body mechanics, Decreased mobility, Impaired flexibility, Postural dysfunction, Decreased strength, Decreased activity  tolerance  Visit Diagnosis: Muscle weakness (generalized)  Abnormality of gait and mobility  Abnormal posture  Acute right-sided low back pain without sciatica  Chronic midline low back pain without sciatica     Problem List Patient Active Problem List   Diagnosis Date Noted   Postsurgical hypothyroidism 11/04/2012   Multiple thyroid nodules 12/19/2011    Heather Roberts, PT, DPT01/16/234:35 PM   [facility] @ [facility] [place], [place], [address] Phone: [phone]   Fax:  [phone]  Name: Kimberly Ryan MRN: [id number] Date of Birth: 04/13/1951

## 2021-12-30 NOTE — Patient Instructions (Addendum)
Access Code: [email or web address] URL: [email or web address] Date: 12/30/2021 Prepared by: Heather Roberts  Exercises Seated Piriformis Stretch with Trunk Bend - 1 x daily - 7 x weekly - 1 sets - 2-3 reps - 30-45s holds Supine Piriformis Stretch - 1 x daily - 7 x weekly - 1 sets - 2-3 reps - 30-45s holds Seated Hamstring Stretch - 1 x daily - 7 x weekly - 1 sets - 2-3 reps - 30-45s holds Supine Hip Adductor Stretch - 1 x daily - 7 x weekly - 1 sets - 2-3 reps - 30-45s holds Standing Lumbar Spine Flexion Stretch Counter - 1 x daily - 7 x weekly - 1 sets - 2-3 reps - 30-45s holds Hooklying Isometric Hip Flexion - 1 x daily - 7 x weekly - 1 sets - 10 reps - 5 hold Sidelying Transversus Abdominis Bracing - 1 x daily - 7 x weekly - 1 sets - 10 reps Sit to Stand - 1 x daily - 7 x weekly - 1 sets - 10 reps Standing Row with Anchored Resistance - 1 x daily - 7 x weekly - 1 sets - 10 reps Supine Lower Trunk Rotation - 1 x daily - 7 x weekly - 3 sets - 10 reps Hooklying Single Knee to Chest - 1 x daily - 7 x weekly - 3 sets - 10 reps  Trigger Point Dry Needling  What is Trigger Point Dry Needling (DN)? DN is a physical therapy technique used to treat muscle pain and dysfunction. Specifically, DN helps deactivate muscle trigger points (muscle knots).  A thin filiform needle is used to penetrate the skin and stimulate the underlying trigger point. The goal is for a local twitch response (LTR) to occur and for the trigger point to relax. No medication of any kind is injected during the procedure.   What Does Trigger Point Dry Needling Feel Like?  The procedure feels different for each individual patient. Some patients report that they do not actually feel the needle enter the skin and overall the process is not painful. Very mild bleeding may occur. However, many patients feel a deep cramping in the muscle in which the needle was inserted. This is the local twitch response.   How Will I feel after the  treatment? Soreness is normal, and the onset of soreness may not occur for a few hours. Typically this soreness does not last longer than two days.  Bruising is uncommon, however; ice can be used to decrease any possible bruising.  In rare cases feeling tired or nauseous after the treatment is normal. In addition, your symptoms may get worse before they get better, this period will typically not last longer than 24 hours.   What Can I do After My Treatment? Increase your hydration by drinking more water for the next 24 hours. You may place ice or heat on the areas treated that have become sore, however, do not use heat on inflamed or bruised areas. Heat often brings more relief post needling. You can continue your regular activities, but vigorous activity is not recommended initially after the treatment for 24 hours. DN is best combined with other physical therapy such as strengthening, stretching, and other therapies.  [facility]  [address] [place] [address].  [phone]

## 2022-01-02 ENCOUNTER — Ambulatory Visit: Payer: PPO | Admitting: Physical Therapy

## 2022-01-02 ENCOUNTER — Encounter: Payer: Self-pay | Admitting: Physical Therapy

## 2022-01-02 ENCOUNTER — Other Ambulatory Visit: Payer: Self-pay

## 2022-01-02 DIAGNOSIS — M6281 Muscle weakness (generalized): Secondary | ICD-10-CM

## 2022-01-02 DIAGNOSIS — R269 Unspecified abnormalities of gait and mobility: Secondary | ICD-10-CM

## 2022-01-02 DIAGNOSIS — M545 Low back pain, unspecified: Secondary | ICD-10-CM

## 2022-01-02 DIAGNOSIS — R293 Abnormal posture: Secondary | ICD-10-CM

## 2022-01-02 DIAGNOSIS — M4316 Spondylolisthesis, lumbar region: Secondary | ICD-10-CM | POA: Diagnosis not present

## 2022-01-02 NOTE — Therapy (Signed)
[facility] @ [facility] [place], [place], [address] Phone: [phone]   Fax:  [phone]  Physical Therapy Treatment  Patient Details  Name: Kimberly Ryan MRN: [id number] Date of Birth: 1951/06/10 Referring Provider (PT): [name], MD   Encounter Date: 01/02/2022   PT End of Session - 01/02/22 1110     Visit Number 4    Date for PT Re-Evaluation 02/17/22    Authorization Type [employer]    Progress Note Due on Visit 10    PT Start Time 1105    PT Stop Time 1148    PT Time Calculation (min) 43 min    Activity Tolerance Patient tolerated treatment well    Behavior During Therapy [facility] for tasks assessed/performed             Past Medical History:  Diagnosis Date   Anxiety    Arthritis    GERD (gastroesophageal reflux disease)    Headache(784.0)    Shortness of breath    with exertion    Thyroid disease     Past Surgical History:  Procedure Laterality Date   MYOMECTOMY  2000   [place]   THYROID LOBECTOMY  08/17/2012   Procedure: THYROID LOBECTOMY;  Surgeon: [name], MD,FACS;  Location: [facility];  Service: General;  Laterality: Left;  Left Thyroid Lobectomy   TONSILLECTOMY  1954    There were no vitals filed for this visit.   Subjective Assessment - 01/02/22 1108     Subjective I did fine with the DN but I'm not sure whether it helped.  I did the AM stretches in bed before getting out of bed and had less pain.    Pertinent History had x-ray with Dr. [name] office and per pt report she had 2 vertebras with decreased disc spacing at Rt lower side.    How long can you sit comfortably? no limits    How long can you stand comfortably? no limits    How long can you walk comfortably? no limits usually does 30 min walk every day    Diagnostic tests had x-ray with Dr. [name] office and per pt report she had 2 vertebras with decreased disc spacing at Rt lower side.     Patient Stated Goals to have less pain and strengthen core    Currently in Pain? Yes    Pain Score 1     Pain Orientation Right;Lower    Pain Descriptors / Indicators Nagging;Sharp    Pain Type Acute pain                               [facility] Adult PT Treatment/Exercise - 01/02/22 0001       Exercises   Exercises Lumbar;Knee/Hip;Shoulder      Lumbar Exercises: Stretches   Single Knee to Chest Stretch Left;Right    Single Knee to Chest Stretch Limitations 10 reps A/ROM    Lower Trunk Rotation Limitations 10 reps A/ROM    Piriformis Stretch Left;Right;1 rep;30 seconds    Piriformis Stretch Limitations supine      Lumbar Exercises: Aerobic   Nustep L1 7 min while discussing status      Lumbar Exercises: Seated   Sit to Stand 10 reps    Sit to Stand Limitations from mat table, no UE assist, TA indraw with exhale on stand    Other Seated Lumbar Exercises red  loop clam x 10 with TA, mat table    Other Seated Lumbar Exercises hip flexion isometric mat table + blue foam pad, alt Rt/Lt 5x5" holds providing manual resistance through hand   tried seated march but Rt SI pain     Lumbar Exercises: Supine   Pelvic Tilt 10 reps    Pelvic Tilt Limitations pilates breathing      Knee/Hip Exercises: Standing   Forward Step Up Right;5 reps;Hand Hold: 2;Step Height: 6"    SLS march taps to 1st step, SLS on Rt LE with Lt foot on 1st step x 15" with glut med focus to avoid Trendelenburg, then step ups    Walking with Sports Cord red tband in static row elbows at sides bent to 90 deg, bwd walking 5x5" holds    Other Standing Knee Exercises sidestepping with red loop band around knees x 3 laps across wall mirror      Shoulder Exercises: Standing   Extension Strengthening;10 reps;Both;Theraband    Theraband Level (Shoulder Extension) Level 2 (Red)    Extension Limitations hold 5" hands at hips for core awareness                       PT Short Term Goals -  12/23/21 1524       PT SHORT TERM GOAL #1   Title Pt will be ind with initial HEP    Time 4    Period Weeks    Status New    Target Date 01/20/22      PT SHORT TERM GOAL #2   Title pt to demonstrate improved lifting mechanics with body weight without pain for decreased risl of re-injury    Time 4    Period Weeks    Status New    Target Date 01/20/22      PT SHORT TERM GOAL #3   Title pt to demonstrate improved core activiation with functional and strengthening tasks for improved back and pelvic stability at least 75% of the time without holding breath.    Time 4    Period Weeks    Status New    Target Date 01/20/22               PT Long Term Goals - 12/23/21 1525       PT LONG TERM GOAL #1   Title Pt will be ind with advanced HEP    Time 8    Period Weeks    Status New    Target Date 02/17/22      PT LONG TERM GOAL #2   Title pt to tolerate standing for at least 1 hour for improved ability to complete household tasks without pain    Time 8    Period Weeks    Status New    Target Date 02/17/22      PT LONG TERM GOAL #3   Title Pt will demo proper bend, lift and carry x 20' of 10lb box with good core and body mechanics to simulate grocery and laundry tasks.    Time 8    Period Weeks    Status New    Target Date 02/17/22      PT LONG TERM GOAL #4   Title pt to demonstrate at least 4+/5 bil hip strength for improved functional squats for household tasks.    Time 8    Period Weeks    Target Date 02/17/22      PT LONG  TERM GOAL #5   Title pt to report at least 50 on FOTO for improved percieved functional mobility.    Time 8    Period Weeks    Status New    Target Date 02/17/22                   Plan - 01/02/22 1114     Clinical Impression Statement Pt did well with DN but was unsure whether it changed her pain.  She did note that the AM stretches in bed and proper bed mobility eliminated her bed with rising out of bed.  PT progressed ther ex  today with cues throughout session to engage TA which Pt observably did well with.  Pt has some closed chain weakness in Rt glut med noted on standing march taps to stair and step ups today but she was able to correct for this with demo and cueing.  If session well tolerated, she will benefit from review and advancement of HEP next time.  She is interested in trying DN #2 again next time.    Comorbidities previous episodes of low back pain.    PT Next Visit Plan Pt would like to try DN again (discussed we do it 1x/week up to 6 times), review ther ex from today's session next time and advance HEP on [place] [id number]    Consulted and Agree with Plan of Care Patient             Patient will benefit from skilled therapeutic intervention in order to improve the following deficits and impairments:     Visit Diagnosis: Muscle weakness (generalized)  Abnormality of gait and mobility  Abnormal posture  Acute right-sided low back pain without sciatica     Problem List Patient Active Problem List   Diagnosis Date Noted   Postsurgical hypothyroidism 11/04/2012   Multiple thyroid nodules 12/19/2011    Baruch Merl, PT 01/02/22 1:47 PM   [facility] @ [place], [place], [address] Phone: [phone]   Fax:  [phone]  Name: JAILIN MANOCCHIO MRN: [id number] Date of Birth: January 28, 1951

## 2022-01-06 ENCOUNTER — Ambulatory Visit: Payer: PPO

## 2022-01-06 ENCOUNTER — Other Ambulatory Visit: Payer: Self-pay

## 2022-01-06 DIAGNOSIS — R269 Unspecified abnormalities of gait and mobility: Secondary | ICD-10-CM

## 2022-01-06 DIAGNOSIS — M6281 Muscle weakness (generalized): Secondary | ICD-10-CM

## 2022-01-06 DIAGNOSIS — M545 Low back pain, unspecified: Secondary | ICD-10-CM

## 2022-01-06 DIAGNOSIS — R293 Abnormal posture: Secondary | ICD-10-CM

## 2022-01-06 DIAGNOSIS — M4316 Spondylolisthesis, lumbar region: Secondary | ICD-10-CM | POA: Diagnosis not present

## 2022-01-06 NOTE — Therapy (Signed)
[facility] @ [place], [place], [address] Phone: [phone]   Fax:  [phone]  Physical Therapy Treatment  Patient Details  Name: Kimberly Ryan MRN: [id number] Date of Birth: 1951/04/12 Referring Provider (PT): [name], MD   Encounter Date: 01/06/2022   PT End of Session - 01/06/22 1203     Visit Number 5    Date for PT Re-Evaluation 02/17/22    Authorization Type [employer]    Progress Note Due on Visit 10    PT Start Time 1105    PT Stop Time 1145    PT Time Calculation (min) 40 min    Activity Tolerance Patient tolerated treatment well    Behavior During Therapy [facility] for tasks assessed/performed             Past Medical History:  Diagnosis Date   Anxiety    Arthritis    GERD (gastroesophageal reflux disease)    Headache(784.0)    Shortness of breath    with exertion    Thyroid disease     Past Surgical History:  Procedure Laterality Date   MYOMECTOMY  2000   [place]   THYROID LOBECTOMY  08/17/2012   Procedure: THYROID LOBECTOMY;  Surgeon: [name], MD,FACS;  Location: [facility];  Service: General;  Laterality: Left;  Left Thyroid Lobectomy   TONSILLECTOMY  1954    There were no vitals filed for this visit.   Subjective Assessment - 01/06/22 1108     Subjective Pt states she feels like she continues to improve. She performs morning exercises and believes they are helpful. She states that she did feel pain and soreness during the day after her last treatment session, but woke up the next day feeling much better.    Pertinent History had x-ray with Dr. [name] office and per pt report she had 2 vertebras with decreased disc spacing at Rt lower side.    How long can you sit comfortably? no limits    How long can you stand comfortably? no limits    How long can you walk comfortably? no limits usually does 30 min walk every day    Diagnostic tests had  x-ray with Dr. [name] office and per pt report she had 2 vertebras with decreased disc spacing at Rt lower side.    Patient Stated Goals to have less pain and strengthen core    Currently in Pain? No/denies    Multiple Pain Sites No                               [facility] Adult PT Treatment/Exercise - 01/06/22 0001       Lumbar Exercises: Aerobic   Nustep L2 7 min while discussing status      Lumbar Exercises: Seated   Sit to Stand 10 reps;20 reps    Sit to Stand Limitations from mat table, no UE assist, TA indraw with exhale on stand; added yellow band around thighs to help encouraged hip abduction activation; 5lb kettle bell for 10 reps      Lumbar Exercises: Sidelying   Clam Both;20 reps   yellow band     Knee/Hip Exercises: Standing   SLS with 3-way kick with transversus abdominus contraction and minimal Bil UE support, 10x each direction, Bil LE    Other Standing Knee Exercises Sidestepping: 2 laps of 10 steps with yellow band  Manual Therapy   Manual therapy comments DN to posterolateral hip muscles and lumbar multifidi              Trigger Point Dry Needling - 01/06/22 0001     Consent Given? Yes    Education Handout Provided Yes    Muscles Treated Back/Hip Gluteus medius;Lumbar multifidi;Piriformis    Gluteus Medius Response Twitch response elicited;Palpable increased muscle length    Piriformis Response Palpable increased muscle length;Twitch response elicited    Lumbar multifidi Response Twitch response elicited;Palpable increased muscle length                   PT Education - 01/06/22 1202     Education Details Pt education performed on new exercises and residual soreness being normal. HEP not updated today, but possilby next session if good response to exercise progression today.    Person(s) Educated Patient    Methods Explanation;Demonstration;Tactile cues;Verbal cues    Comprehension Verbalized understanding               PT Short Term Goals - 12/23/21 1524       PT SHORT TERM GOAL #1   Title Pt will be ind with initial HEP    Time 4    Period Weeks    Status New    Target Date 01/20/22      PT SHORT TERM GOAL #2   Title pt to demonstrate improved lifting mechanics with body weight without pain for decreased risl of re-injury    Time 4    Period Weeks    Status New    Target Date 01/20/22      PT SHORT TERM GOAL #3   Title pt to demonstrate improved core activiation with functional and strengthening tasks for improved back and pelvic stability at least 75% of the time without holding breath.    Time 4    Period Weeks    Status New    Target Date 01/20/22               PT Long Term Goals - 12/23/21 1525       PT LONG TERM GOAL #1   Title Pt will be ind with advanced HEP    Time 8    Period Weeks    Status New    Target Date 02/17/22      PT LONG TERM GOAL #2   Title pt to tolerate standing for at least 1 hour for improved ability to complete household tasks without pain    Time 8    Period Weeks    Status New    Target Date 02/17/22      PT LONG TERM GOAL #3   Title Pt will demo proper bend, lift and carry x 20' of 10lb box with good core and body mechanics to simulate grocery and laundry tasks.    Time 8    Period Weeks    Status New    Target Date 02/17/22      PT LONG TERM GOAL #4   Title pt to demonstrate at least 4+/5 bil hip strength for improved functional squats for household tasks.    Time 8    Period Weeks    Target Date 02/17/22      PT LONG TERM GOAL #5   Title pt to report at least 70 on FOTO for improved percieved functional mobility.    Time 8    Period Weeks    Status New  Target Date 02/17/22                   Plan - 01/06/22 1204     Clinical Impression Statement Pt overall demonstrating good improvement with decreased morning pain. DN continued today due to pt request and palpable tension in Rt posterolateral hip muscles; she  demosntrated appropriate twitch response and muscle lengthening. She did well with all exericse progressions targeted at hip/core strengthening with improving motor control to help stabilize lumbopelvic region. Lt weight shift and valgus knee collapse with squats, but complete reduction with VCs and band around thighs to encourage hip abduction facilitation. She will continue to benefit from skilled PT intervention in order to progress strengthening and decrease LBP.    Personal Factors and Comorbidities Age;Comorbidity 1    Comorbidities previous episodes of low back pain.    Examination-Activity Limitations Transfers;Locomotion Level    Examination-Participation Restrictions Community Activity    Rehab Potential Good    PT Frequency 2x / week    PT Duration 8 weeks    PT Treatment/Interventions ADLs/Self Care Home Management;Aquatic Therapy;Moist Heat;Functional mobility training;Therapeutic activities;Therapeutic exercise;Balance training;Neuromuscular re-education;Manual techniques;Patient/family education;Scar mobilization;Passive range of motion;Dry needling;Energy conservation;Stair training    PT Next Visit Plan Plan to advance HEP if good tolerance to increased strengthening.    PT Home Exercise Plan [id number]    Consulted and Agree with Plan of Care Patient             Patient will benefit from skilled therapeutic intervention in order to improve the following deficits and impairments:  Decreased endurance, Pain, Difficulty walking, Improper body mechanics, Decreased mobility, Impaired flexibility, Postural dysfunction, Decreased strength, Decreased activity tolerance  Visit Diagnosis: Muscle weakness (generalized)  Abnormality of gait and mobility  Abnormal posture  Acute right-sided low back pain without sciatica  Chronic midline low back pain without sciatica     Problem List Patient Active Problem List   Diagnosis Date Noted   Postsurgical hypothyroidism 11/04/2012    Multiple thyroid nodules 12/19/2011    Heather Roberts, PT, DPT01/23/2312:13 PM   [facility] @ [place], [place], [address] Phone: [phone]   Fax:  [phone]  Name: ANBERLIN DIEZ MRN: [id number] Date of Birth: 08-Feb-1951

## 2022-01-09 ENCOUNTER — Ambulatory Visit: Payer: PPO

## 2022-01-09 ENCOUNTER — Other Ambulatory Visit: Payer: Self-pay

## 2022-01-09 DIAGNOSIS — G8929 Other chronic pain: Secondary | ICD-10-CM

## 2022-01-09 DIAGNOSIS — M545 Low back pain, unspecified: Secondary | ICD-10-CM

## 2022-01-09 DIAGNOSIS — M6281 Muscle weakness (generalized): Secondary | ICD-10-CM

## 2022-01-09 DIAGNOSIS — R269 Unspecified abnormalities of gait and mobility: Secondary | ICD-10-CM

## 2022-01-09 DIAGNOSIS — R293 Abnormal posture: Secondary | ICD-10-CM

## 2022-01-09 DIAGNOSIS — M4316 Spondylolisthesis, lumbar region: Secondary | ICD-10-CM | POA: Diagnosis not present

## 2022-01-09 NOTE — Therapy (Signed)
[facility] @ [place], [place], [address] Phone: [phone]   Fax:  [phone]  Physical Therapy Treatment  Patient Details  Name: Kimberly Ryan MRN: [id number] Date of Birth: 03-05-1951 Referring Provider (PT): [name], MD   Encounter Date: 01/09/2022   PT End of Session - 01/09/22 1159     Visit Number 6    Date for PT Re-Evaluation 02/17/22    Authorization Type [employer]    Progress Note Due on Visit 10    PT Start Time 1106    PT Stop Time 1148    PT Time Calculation (min) 42 min    Activity Tolerance Patient tolerated treatment well    Behavior During Therapy [facility] for tasks assessed/performed             Past Medical History:  Diagnosis Date   Anxiety    Arthritis    GERD (gastroesophageal reflux disease)    Headache(784.0)    Shortness of breath    with exertion    Thyroid disease     Past Surgical History:  Procedure Laterality Date   MYOMECTOMY  2000   [place]   THYROID LOBECTOMY  08/17/2012   Procedure: THYROID LOBECTOMY;  Surgeon: [name], MD,FACS;  Location: [facility];  Service: General;  Laterality: Left;  Left Thyroid Lobectomy   TONSILLECTOMY  1954    There were no vitals filed for this visit.   Subjective Assessment - 01/09/22 1106     Subjective Pt states that she feels like she is continuing to improve; she will still occasionally have twinge in low back, but it is not as deep or as intense as it has been. She has noticed since having LBP the Lt side of her neck will hurt her.    Pertinent History had x-ray with Dr. [name] office and per pt report she had 2 vertebras with decreased disc spacing at Rt lower side.    Patient Stated Goals to have less pain and strengthen core    Currently in Pain? Yes    Pain Score 6     Pain Location Neck    Pain Orientation Left    Pain Descriptors / Indicators Aching;Throbbing    Pain Type Chronic  pain    Pain Radiating Towards Head causing headache.    Pain Onset More than a month ago    Aggravating Factors  After sleeping in a strange way    Pain Relieving Factors movement    Effect of Pain on Daily Activities minimal    Multiple Pain Sites No                               [facility] Adult PT Treatment/Exercise - 01/09/22 0001       Lumbar Exercises: Standing   Row Strengthening;Both;20 reps;Theraband   green   Shoulder Extension Strengthening;Both;20 reps;Theraband   green   Other Standing Lumbar Exercises Straight leg dead lift, 15x, 10lbs, breath coordination and core engagement      Knee/Hip Exercises: Standing   SLS with 3-way kick with transversus abdominus contraction and minimal Bil UE support, 10x each direction, Bil LE    Other Standing Knee Exercises Sidestepping: 2 laps of 10 steps with red band      Manual Therapy   Manual Therapy Soft tissue mobilization    Manual therapy comments DN to Lt. neck  Soft tissue mobilization softtissue mobilization to Lt neck              Trigger Point Dry Needling - 01/09/22 0001     Consent Given? Yes    Education Handout Provided Yes    Muscles Treated Head and Neck Upper trapezius;Cervical multifidi;Suboccipitals    Upper Trapezius Response Twitch reponse elicited;Palpable increased muscle length    Suboccipitals Response Twitch response elicited;Palpable increased muscle length    Cervical multifidi Response Twitch reponse elicited;Palpable increased muscle length                   PT Education - 01/09/22 1159     Education Details Pt education performed on how working arms and legs can help to improve core strength; HEP updated and discussed expectations of performance. [id number]    Person(s) Educated Patient    Methods Explanation;Demonstration;Verbal cues;Handout;Tactile cues    Comprehension Verbalized understanding              PT Short Term Goals - 12/23/21 1524        PT SHORT TERM GOAL #1   Title Pt will be ind with initial HEP    Time 4    Period Weeks    Status New    Target Date 01/20/22      PT SHORT TERM GOAL #2   Title pt to demonstrate improved lifting mechanics with body weight without pain for decreased risl of re-injury    Time 4    Period Weeks    Status New    Target Date 01/20/22      PT SHORT TERM GOAL #3   Title pt to demonstrate improved core activiation with functional and strengthening tasks for improved back and pelvic stability at least 75% of the time without holding breath.    Time 4    Period Weeks    Status New    Target Date 01/20/22               PT Long Term Goals - 12/23/21 1525       PT LONG TERM GOAL #1   Title Pt will be ind with advanced HEP    Time 8    Period Weeks    Status New    Target Date 02/17/22      PT LONG TERM GOAL #2   Title pt to tolerate standing for at least 1 hour for improved ability to complete household tasks without pain    Time 8    Period Weeks    Status New    Target Date 02/17/22      PT LONG TERM GOAL #3   Title Pt will demo proper bend, lift and carry x 20' of 10lb box with good core and body mechanics to simulate grocery and laundry tasks.    Time 8    Period Weeks    Status New    Target Date 02/17/22      PT LONG TERM GOAL #4   Title pt to demonstrate at least 4+/5 bil hip strength for improved functional squats for household tasks.    Time 8    Period Weeks    Target Date 02/17/22      PT LONG TERM GOAL #5   Title pt to report at least 56 on FOTO for improved percieved functional mobility.    Time 8    Period Weeks    Status New    Target Date 02/17/22  Plan - 01/09/22 1138     Clinical Impression Statement Due to pain in R cervical muscles, possibly be related to low back pain due to compensation and overall core weakness, DN and sof ttissue mobilization performed to trigger points and restriction that are most likely  responsible for pain. Large improvement in tension and pain after manual treatments. Pt did well with all previous progressions and addition of Bil UE extensions and deadlifts (she states that Rt knee will bother her and she was worried about doing many sit<>stands); she reported no increase in pain with any exercise. HEP updated with strengthening activities and we discussed using exercise program like exericse library and just picking several each day. She will continue to benefit from skilled PT intervention in order to progress towards goals.    PT Treatment/Interventions ADLs/Self Care Home Management;Aquatic Therapy;Moist Heat;Functional mobility training;Therapeutic activities;Therapeutic exercise;Balance training;Neuromuscular re-education;Manual techniques;Patient/family education;Scar mobilization;Passive range of motion;Dry needling;Energy conservation;Stair training    PT Next Visit Plan Continue to progress strengthening to pt tolerance.    PT Home Exercise Plan [id number]    Consulted and Agree with Plan of Care Patient             Patient will benefit from skilled therapeutic intervention in order to improve the following deficits and impairments:  Decreased endurance, Pain, Difficulty walking, Improper body mechanics, Decreased mobility, Impaired flexibility, Postural dysfunction, Decreased strength, Decreased activity tolerance  Visit Diagnosis: Muscle weakness (generalized)  Abnormality of gait and mobility  Abnormal posture  Acute right-sided low back pain without sciatica  Chronic midline low back pain without sciatica     Problem List Patient Active Problem List   Diagnosis Date Noted   Postsurgical hypothyroidism 11/04/2012   Multiple thyroid nodules 12/19/2011    Heather Roberts, PT, DPT01/26/2312:21 PM   [facility] @ [place], [place], [address] Phone: [phone]   Fax:   [phone]  Name: Kimberly Ryan MRN: [id number] Date of Birth: Mar 04, 1951

## 2022-01-09 NOTE — Therapy (Signed)
[facility] @ [place], [place], [address] Phone: [phone]   Fax:  [phone]  Physical Therapy Treatment  Patient Details  Name: Kimberly Ryan MRN: [id number] Date of Birth: [birth year] Referring Provider (PT): [name], MD   Encounter Date: 01/09/2022   PT End of Session - 01/09/22 1159     Visit Number 6    Date for PT Re-Evaluation 02/17/22    Authorization Type [employer]    Progress Note Due on Visit 10    PT Start Time 1106    PT Stop Time 1148    PT Time Calculation (min) 42 min    Activity Tolerance Patient tolerated treatment well    Behavior During Therapy [facility] for tasks assessed/performed             Past Medical History:  Diagnosis Date   Anxiety    Arthritis    GERD (gastroesophageal reflux disease)    Headache(784.0)    Shortness of breath    with exertion    Thyroid disease     Past Surgical History:  Procedure Laterality Date   MYOMECTOMY  2000   [place]   THYROID LOBECTOMY  08/17/2012   Procedure: THYROID LOBECTOMY;  Surgeon: [name], MD,FACS;  Location: [facility];  Service: General;  Laterality: Left;  Left Thyroid Lobectomy   TONSILLECTOMY  1954    There were no vitals filed for this visit.   Subjective Assessment - 01/09/22 1106     Subjective Pt states that she feels like she is continuing to improve; she will still occasionally have twinge in low back, but it is not as deep or as intense as it has been. She has noticed since having LBP the Lt side of her neck will hurt her.    Pertinent History had x-ray with Dr. [name] office and per pt report she had 2 vertebras with decreased disc spacing at Rt lower side.    Patient Stated Goals to have less pain and strengthen core    Currently in Pain? Yes    Pain Score 6     Pain Location Neck    Pain Orientation Left    Pain Descriptors / Indicators Aching;Throbbing    Pain Type Chronic  pain    Pain Radiating Towards Head causing headache.    Pain Onset More than a month ago    Aggravating Factors  After sleeping in a strange way    Pain Relieving Factors movement    Effect of Pain on Daily Activities minimal    Multiple Pain Sites No                               [facility] Adult PT Treatment/Exercise - 01/09/22 0001       Lumbar Exercises: Standing   Row Strengthening;Both;20 reps;Theraband   green   Shoulder Extension Strengthening;Both;20 reps;Theraband   green   Other Standing Lumbar Exercises Straight leg dead lift, 15x, 10lbs, breath coordination and core engagement      Knee/Hip Exercises: Standing   SLS with 3-way kick with transversus abdominus contraction and minimal Bil UE support, 10x each direction, Bil LE    Other Standing Knee Exercises Sidestepping: 2 laps of 10 steps with red band      Manual Therapy   Manual Therapy Soft tissue mobilization    Manual therapy comments DN to Lt. neck  Soft tissue mobilization softtissue mobilization to Lt neck              Trigger Point Dry Needling - 01/09/22 0001     Consent Given? Yes    Education Handout Provided Yes    Muscles Treated Head and Neck Upper trapezius;Cervical multifidi;Suboccipitals    Upper Trapezius Response Twitch reponse elicited;Palpable increased muscle length    Suboccipitals Response Twitch response elicited;Palpable increased muscle length    Cervical multifidi Response Twitch reponse elicited;Palpable increased muscle length                   PT Education - 01/09/22 1159     Education Details Pt education performed on how working arms and legs can help to improve core strength; HEP updated and discussed expectations of performance. [id number]    Person(s) Educated Patient    Methods Explanation;Demonstration;Verbal cues;Handout;Tactile cues    Comprehension Verbalized understanding              PT Short Term Goals - 12/23/21 1524        PT SHORT TERM GOAL #1   Title Pt will be ind with initial HEP    Time 4    Period Weeks    Status New    Target Date 01/20/22      PT SHORT TERM GOAL #2   Title pt to demonstrate improved lifting mechanics with body weight without pain for decreased risl of re-injury    Time 4    Period Weeks    Status New    Target Date 01/20/22      PT SHORT TERM GOAL #3   Title pt to demonstrate improved core activiation with functional and strengthening tasks for improved back and pelvic stability at least 75% of the time without holding breath.    Time 4    Period Weeks    Status New    Target Date 01/20/22               PT Long Term Goals - 12/23/21 1525       PT LONG TERM GOAL #1   Title Pt will be ind with advanced HEP    Time 8    Period Weeks    Status New    Target Date 02/17/22      PT LONG TERM GOAL #2   Title pt to tolerate standing for at least 1 hour for improved ability to complete household tasks without pain    Time 8    Period Weeks    Status New    Target Date 02/17/22      PT LONG TERM GOAL #3   Title Pt will demo proper bend, lift and carry x 20' of 10lb box with good core and body mechanics to simulate grocery and laundry tasks.    Time 8    Period Weeks    Status New    Target Date 02/17/22      PT LONG TERM GOAL #4   Title pt to demonstrate at least 4+/5 bil hip strength for improved functional squats for household tasks.    Time 8    Period Weeks    Target Date 02/17/22      PT LONG TERM GOAL #5   Title pt to report at least 72 on FOTO for improved percieved functional mobility.    Time 8    Period Weeks    Status New    Target Date 02/17/22  Plan - 01/09/22 1138     Clinical Impression Statement Due to pain in R cervical muscles, possibly be related to low back pain due to compensation and overall core weakness, DN and sof ttissue mobilization performed to trigger points and restriction that are most likely  responsible for pain. Large improvement in tension and pain after manual treatments. Pt did well with all previous progressions and addition of Bil UE extensions and deadlifts (she states that Rt knee will bother her and she was worried about doing many sit<>stands); she reported no increase in pain with any exercise. HEP updated with strengthening activities and we discussed using exercise program like exericse library and just picking several each day. She will continue to benefit from skilled PT intervention in order to progress towards goals.    PT Treatment/Interventions ADLs/Self Care Home Management;Aquatic Therapy;Moist Heat;Functional mobility training;Therapeutic activities;Therapeutic exercise;Balance training;Neuromuscular re-education;Manual techniques;Patient/family education;Scar mobilization;Passive range of motion;Dry needling;Energy conservation;Stair training    PT Next Visit Plan Continue to progress strengthening to pt tolerance.    PT Home Exercise Plan [id number]    Consulted and Agree with Plan of Care Patient             Patient will benefit from skilled therapeutic intervention in order to improve the following deficits and impairments:  Decreased endurance, Pain, Difficulty walking, Improper body mechanics, Decreased mobility, Impaired flexibility, Postural dysfunction, Decreased strength, Decreased activity tolerance  Visit Diagnosis: Muscle weakness (generalized)  Abnormality of gait and mobility  Abnormal posture  Acute right-sided low back pain without sciatica  Chronic midline low back pain without sciatica     Problem List Patient Active Problem List   Diagnosis Date Noted   Postsurgical hypothyroidism 11/04/2012   Multiple thyroid nodules 12/19/2011    Heather Roberts, PT, DPT01/26/2312:02 PM   [facility] @ [place], [place], [address] Phone: [phone]   Fax:   [phone]  Name: Kimberly Ryan MRN: [id number] Date of Birth: [birth year]

## 2022-01-13 ENCOUNTER — Other Ambulatory Visit: Payer: Self-pay

## 2022-01-13 ENCOUNTER — Ambulatory Visit: Payer: PPO

## 2022-01-13 DIAGNOSIS — M4316 Spondylolisthesis, lumbar region: Secondary | ICD-10-CM | POA: Diagnosis not present

## 2022-01-13 DIAGNOSIS — R293 Abnormal posture: Secondary | ICD-10-CM

## 2022-01-13 DIAGNOSIS — M545 Low back pain, unspecified: Secondary | ICD-10-CM

## 2022-01-13 DIAGNOSIS — M6281 Muscle weakness (generalized): Secondary | ICD-10-CM

## 2022-01-13 DIAGNOSIS — R269 Unspecified abnormalities of gait and mobility: Secondary | ICD-10-CM

## 2022-01-13 DIAGNOSIS — G8929 Other chronic pain: Secondary | ICD-10-CM

## 2022-01-13 NOTE — Therapy (Signed)
[facility] @ [place] [place], [place], [address] Phone: [phone]   Fax:  [phone]  Physical Therapy Treatment  Patient Details  Name: Kimberly Ryan MRN: [id number] Date of Birth: [birth year] Referring Provider (PT): [name], MD   Encounter Date: 01/13/2022   PT End of Session - 01/13/22 1113     Visit Number 7    Date for PT Re-Evaluation 02/17/22    Authorization Type [employer]    Progress Note Due on Visit 10    PT Start Time 1104    PT Stop Time 1141    PT Time Calculation (min) 37 min    Activity Tolerance Patient tolerated treatment well    Behavior During Therapy [facility] for tasks assessed/performed             Past Medical History:  Diagnosis Date   Anxiety    Arthritis    GERD (gastroesophageal reflux disease)    Headache(784.0)    Shortness of breath    with exertion    Thyroid disease     Past Surgical History:  Procedure Laterality Date   MYOMECTOMY  2000   [place]   THYROID LOBECTOMY  08/17/2012   Procedure: THYROID LOBECTOMY;  Surgeon: [name], MD,FACS;  Location: [facility];  Service: General;  Laterality: Left;  Left Thyroid Lobectomy   TONSILLECTOMY  1954    There were no vitals filed for this visit.   Subjective Assessment - 01/13/22 1106     Subjective Pt continues to report that morning pain is improving and if she has any, it's a 1/10. She is not currently experiencing any pain or discomfort. Over the last few days, she will occasionally feel a twinge if shesat for a long period of time and gets up. She reports exercises are going well at home.    Patient Stated Goals to have less pain and strengthen core    Currently in Pain? No/denies                               [facility] Adult PT Treatment/Exercise - 01/13/22 0001       Neuro Re-ed    Neuro Re-ed Details  Transversus abdominus leg extensions with VC/TCs for  appropriate core activation      Lumbar Exercises: Stretches   Active Hamstring Stretch Right;Left;1 rep;30 seconds    Piriformis Stretch Right;Left;1 rep;30 seconds      Lumbar Exercises: Standing   Row Strengthening;Both;20 reps;Theraband    Shoulder Extension Strengthening;Both;20 reps;Theraband    Other Standing Lumbar Exercises Straight leg dead lift, 15x, 10lbs, breath coordination and core engagement    Other Standing Lumbar Exercises Pallof press, 2 x 10 Bil, red      Lumbar Exercises: Supine   Bridge with Ball Squeeze 10 reps      Knee/Hip Exercises: Standing   SLS with 3-way kick with transversus abdominus contraction and minimal Bil UE support, 10x each direction, Bil LE                       PT Short Term Goals - 12/23/21 1524       PT SHORT TERM GOAL #1   Title Pt will be ind with initial HEP    Time 4    Period Weeks    Status New    Target Date 01/20/22  PT SHORT TERM GOAL #2   Title pt to demonstrate improved lifting mechanics with body weight without pain for decreased risl of re-injury    Time 4    Period Weeks    Status New    Target Date 01/20/22      PT SHORT TERM GOAL #3   Title pt to demonstrate improved core activiation with functional and strengthening tasks for improved back and pelvic stability at least 75% of the time without holding breath.    Time 4    Period Weeks    Status New    Target Date 01/20/22               PT Long Term Goals - 12/23/21 1525       PT LONG TERM GOAL #1   Title Pt will be ind with advanced HEP    Time 8    Period Weeks    Status New    Target Date 02/17/22      PT LONG TERM GOAL #2   Title pt to tolerate standing for at least 1 hour for improved ability to complete household tasks without pain    Time 8    Period Weeks    Status New    Target Date 02/17/22      PT LONG TERM GOAL #3   Title Pt will demo proper bend, lift and carry x 20' of 10lb box with good core and body  mechanics to simulate grocery and laundry tasks.    Time 8    Period Weeks    Status New    Target Date 02/17/22      PT LONG TERM GOAL #4   Title pt to demonstrate at least 4+/5 bil hip strength for improved functional squats for household tasks.    Time 8    Period Weeks    Target Date 02/17/22      PT LONG TERM GOAL #5   Title pt to report at least 49 on FOTO for improved percieved functional mobility.    Time 8    Period Weeks    Status New    Target Date 02/17/22                   Plan - 01/13/22 1113     Clinical Impression Statement Pt continues to make excellent progress and is experiencing very little pain overall. No manual techniques required today and focus placed on strengthening activities. She tolerated progressoin of Pallof press and supine leg extensions well and with good facilitation of core and form. All stretches felt good and she denied any increase in pain throughout session. She is tolerated lumbar extension better demonstrated by ability to perform supine bridges without any increase in pain. She will continue to benefit from skilled PT intervention in order to progress towards goal completion.    PT Treatment/Interventions ADLs/Self Care Home Management;Aquatic Therapy;Moist Heat;Functional mobility training;Therapeutic activities;Therapeutic exercise;Balance training;Neuromuscular re-education;Manual techniques;Patient/family education;Scar mobilization;Passive range of motion;Dry needling;Energy conservation;Stair training    PT Next Visit Plan Continue to progress functional strengthening; begin discharge planning with patient due to good progress.    PT Home Exercise Plan [id number]    Consulted and Agree with Plan of Care Patient             Patient will benefit from skilled therapeutic intervention in order to improve the following deficits and impairments:  Decreased endurance, Pain, Difficulty walking, Improper body mechanics, Decreased  mobility, Impaired flexibility, Postural  dysfunction, Decreased strength, Decreased activity tolerance  Visit Diagnosis: Muscle weakness (generalized)  Abnormality of gait and mobility  Abnormal posture  Acute right-sided low back pain without sciatica  Chronic midline low back pain without sciatica     Problem List Patient Active Problem List   Diagnosis Date Noted   Postsurgical hypothyroidism 11/04/2012   Multiple thyroid nodules 12/19/2011    Heather Roberts, PT, DPT01/30/2311:42 AM   [facility] @ [place], [place], [address] Phone: [phone]   Fax:  [phone]  Name: LUVINA POIRIER MRN: [id number] Date of Birth: 1951/03/29

## 2022-01-16 ENCOUNTER — Encounter: Payer: Self-pay | Admitting: Physical Therapy

## 2022-01-16 ENCOUNTER — Other Ambulatory Visit: Payer: Self-pay

## 2022-01-16 ENCOUNTER — Ambulatory Visit: Payer: PPO | Attending: Sports Medicine | Admitting: Physical Therapy

## 2022-01-16 DIAGNOSIS — R269 Unspecified abnormalities of gait and mobility: Secondary | ICD-10-CM | POA: Diagnosis not present

## 2022-01-16 DIAGNOSIS — M545 Low back pain, unspecified: Secondary | ICD-10-CM | POA: Diagnosis not present

## 2022-01-16 DIAGNOSIS — R293 Abnormal posture: Secondary | ICD-10-CM | POA: Diagnosis not present

## 2022-01-16 DIAGNOSIS — M6281 Muscle weakness (generalized): Secondary | ICD-10-CM

## 2022-01-16 DIAGNOSIS — G8929 Other chronic pain: Secondary | ICD-10-CM | POA: Insufficient documentation

## 2022-01-16 NOTE — Therapy (Signed)
[facility] @ [place], [place], [address] Phone: [phone]   Fax:  [phone]  Physical Therapy Treatment  Patient Details  Name: Kimberly Ryan MRN: [id number] Date of Birth: August 22, 1951 Referring Provider (PT): [name], MD   Encounter Date: 01/16/2022   PT End of Session - 01/16/22 1109     Visit Number 8    Date for PT Re-Evaluation 02/17/22    Authorization Type [employer]    Progress Note Due on Visit 10    PT Start Time 1109   Pt late   PT Stop Time 1147    PT Time Calculation (min) 38 min    Activity Tolerance Patient tolerated treatment well    Behavior During Therapy [facility] for tasks assessed/performed             Past Medical History:  Diagnosis Date   Anxiety    Arthritis    GERD (gastroesophageal reflux disease)    Headache(784.0)    Shortness of breath    with exertion    Thyroid disease     Past Surgical History:  Procedure Laterality Date   MYOMECTOMY  2000   [place]   THYROID LOBECTOMY  08/17/2012   Procedure: THYROID LOBECTOMY;  Surgeon: [name], MD,FACS;  Location: [facility];  Service: General;  Laterality: Left;  Left Thyroid Lobectomy   TONSILLECTOMY  1954    There were no vitals filed for this visit.   Subjective Assessment - 01/16/22 1109     Subjective I have very minimal twinges of pain from time to time but so much better overall.    Pertinent History had x-ray with Dr. [name] office and per pt report she had 2 vertebras with decreased disc spacing at Rt lower side.    How long can you sit comfortably? no limits    How long can you stand comfortably? no limits    How long can you walk comfortably? no limits usually does 30 min walk every day    Patient Stated Goals to have less pain and strengthen core    Currently in Pain? No/denies    Pain Location Back                               [facility] Adult PT  Treatment/Exercise - 01/16/22 0001       Exercises   Exercises Shoulder;Knee/Hip;Lumbar      Lumbar Exercises: Stretches   Active Hamstring Stretch Right;Left;1 rep;30 seconds    Active Hamstring Stretch Limitations seated    Piriformis Stretch Right;Left;1 rep;30 seconds    Piriformis Stretch Limitations seated      Lumbar Exercises: Aerobic   Nustep L3 x 4' PT present to discuss progress      Lumbar Exercises: Standing   Other Standing Lumbar Exercises Straight leg dead lift, 10x, 10lbs, breath coordination and core engagement   Pt reported slight LBP after 10 reps   Other Standing Lumbar Exercises Pallof press, 2 x 10 Bil, red      Lumbar Exercises: Supine   Bridge with [employer] 20 reps    Bridge with [employer] Limitations 2x10, no pain with this today      Knee/Hip Exercises: Standing   SLS with 3-way kick with transversus abdominus contraction and minimal Bil UE support, 10x each direction, Bil LE      Knee/Hip Exercises: Seated  Sit to Sand 10 reps;without UE support   yellow band at knees and hold 5lb dumbbell, VC for exhale on stand and TA indraw     Shoulder Exercises: Standing   Extension Strengthening;Both;20 reps;Theraband    Theraband Level (Shoulder Extension) Level 3 (Green)    Row [employer];Theraband    Theraband Level (Shoulder Row) Level 3 (Green)                       PT Short Term Goals - 12/23/21 1524       PT SHORT TERM GOAL #1   Title Pt will be ind with initial HEP    Time 4    Period Weeks    Status New    Target Date 01/20/22      PT SHORT TERM GOAL #2   Title pt to demonstrate improved lifting mechanics with body weight without pain for decreased risl of re-injury    Time 4    Period Weeks    Status New    Target Date 01/20/22      PT SHORT TERM GOAL #3   Title pt to demonstrate improved core activiation with functional and strengthening tasks for improved back and pelvic stability at least 75% of the  time without holding breath.    Time 4    Period Weeks    Status New    Target Date 01/20/22               PT Long Term Goals - 12/23/21 1525       PT LONG TERM GOAL #1   Title Pt will be ind with advanced HEP    Time 8    Period Weeks    Status New    Target Date 02/17/22      PT LONG TERM GOAL #2   Title pt to tolerate standing for at least 1 hour for improved ability to complete household tasks without pain    Time 8    Period Weeks    Status New    Target Date 02/17/22      PT LONG TERM GOAL #3   Title Pt will demo proper bend, lift and carry x 20' of 10lb box with good core and body mechanics to simulate grocery and laundry tasks.    Time 8    Period Weeks    Status New    Target Date 02/17/22      PT LONG TERM GOAL #4   Title pt to demonstrate at least 4+/5 bil hip strength for improved functional squats for household tasks.    Time 8    Period Weeks    Target Date 02/17/22      PT LONG TERM GOAL #5   Title pt to report at least 72 on FOTO for improved percieved functional mobility.    Time 8    Period Weeks    Status New    Target Date 02/17/22                   Plan - 01/16/22 1129     Clinical Impression Statement Pt continues to have little to no pain.  She reports she gets occassional low grade twinges that don't last.  She had excellent work rate throughout session without breaks.  She demo'd excellent breath control and TA use with all ther ex today.  She has not been compliant with HEP yet but intends to work it in.  She reports she feels stronger as the session goes on and was able to add 2nd set of bridges end of session.  Continue along POC to progress strength and emphasize HEP compliance.    Comorbidities previous episodes of low back pain.    PT Frequency 2x / week    PT Duration 8 weeks    PT Next Visit Plan Continue to progress functional strengthening; begin discharge planning with patient due to good progress.    PT Home  Exercise Plan [id number]    Consulted and Agree with Plan of Care Patient             Patient will benefit from skilled therapeutic intervention in order to improve the following deficits and impairments:     Visit Diagnosis: Muscle weakness (generalized)  Abnormality of gait and mobility  Abnormal posture  Acute right-sided low back pain without sciatica  Chronic midline low back pain without sciatica     Problem List Patient Active Problem List   Diagnosis Date Noted   Postsurgical hypothyroidism 11/04/2012   Multiple thyroid nodules 12/19/2011    Baruch Merl, PT 01/16/22 1:32 PM   [facility] @ [place], [place], [address] Phone: [phone]   Fax:  [phone]  Name: Kimberly Ryan MRN: [id number] Date of Birth: 16-Jul-1951

## 2022-01-20 ENCOUNTER — Other Ambulatory Visit: Payer: Self-pay

## 2022-01-20 ENCOUNTER — Ambulatory Visit: Payer: PPO

## 2022-01-20 DIAGNOSIS — M6281 Muscle weakness (generalized): Secondary | ICD-10-CM | POA: Diagnosis not present

## 2022-01-20 DIAGNOSIS — M545 Low back pain, unspecified: Secondary | ICD-10-CM

## 2022-01-20 DIAGNOSIS — R293 Abnormal posture: Secondary | ICD-10-CM

## 2022-01-20 DIAGNOSIS — R269 Unspecified abnormalities of gait and mobility: Secondary | ICD-10-CM

## 2022-01-20 DIAGNOSIS — G8929 Other chronic pain: Secondary | ICD-10-CM

## 2022-01-20 NOTE — Therapy (Addendum)
[facility] @ [place], [place], [address] Phone: [phone]   Fax:  [phone]  Physical Therapy Treatment  Patient Details  Name: Kimberly Ryan MRN: [id number] Date of Birth: Nov 27, 1951 Referring Provider (PT): [name], MD   Encounter Date: 01/20/2022   Physical Therapy Progress Note  Dates of Reporting Period: 12/23/2021 to 01/20/2022    PT End of Session - 01/20/22 1136     Visit Number 9    Date for PT Re-Evaluation 02/17/22    Authorization Type [employer]    Progress Note Due on Visit 10    PT Start Time 1106    PT Stop Time 1144    PT Time Calculation (min) 38 min             Past Medical History:  Diagnosis Date   Anxiety    Arthritis    GERD (gastroesophageal reflux disease)    Headache(784.0)    Shortness of breath    with exertion    Thyroid disease     Past Surgical History:  Procedure Laterality Date   MYOMECTOMY  2000   [place]   THYROID LOBECTOMY  08/17/2012   Procedure: THYROID LOBECTOMY;  Surgeon: [name], MD,FACS;  Location: [facility];  Service: General;  Laterality: Left;  Left Thyroid Lobectomy   TONSILLECTOMY  1954    There were no vitals filed for this visit.   Subjective Assessment - 01/20/22 1107     Subjective Pt states that she is feeling good this morning and reports no pain. However, Saturday night she had diffuse pain across her low back, but nothing into the Rt buttock. She rested and then did some exercises, which she reports was very helpful.    Pertinent History had x-ray with Dr. [name] office and per pt report she had 2 vertebras with decreased disc spacing at Rt lower side.    Patient Stated Goals to have less pain and strengthen core    Currently in Pain? No/denies    Multiple Pain Sites No                               [facility] Adult PT Treatment/Exercise - 01/20/22 0001       Lumbar  Exercises: Standing   Other Standing Lumbar Exercises Straight leg dead lift, 10x, 15lbs, breath coordination and core engagement    Other Standing Lumbar Exercises --      Lumbar Exercises: Supine   Bridge with Ball Squeeze Limitations 2x10, no pain with this today      Lumbar Exercises: Sidelying   Clam 20 reps;Right;Left    Hip Abduction 20 reps;Right;Left      Knee/Hip Exercises: Stretches   Piriformis Stretch Right;Left;1 rep;60 seconds    Other Knee/Hip Stretches Hamstring stretch 1 x 60 sec Bil      Knee/Hip Exercises: Standing   Heel Raises Both;20 reps   15lbs   SLS --                     PT Education - 01/20/22 1121     Education Details Pt educatio nperformed on exercise progressions; no updates to HEP made at this time; ewncouraged that is she does not get to all excercises every day it is ok. [id number]    Methods Explanation    Comprehension Verbalized understanding  PT Short Term Goals - 01/20/22 1131       PT SHORT TERM GOAL #1   Title Pt will be ind with initial HEP    Time 4    Period Weeks    Target Date 01/20/22      PT SHORT TERM GOAL #2   Title pt to demonstrate improved lifting mechanics with body weight without pain for decreased risl of re-injury    Time 4    Period Weeks    Status Achieved    Target Date 01/20/22      PT SHORT TERM GOAL #3   Title pt to demonstrate improved core activiation with functional and strengthening tasks for improved back and pelvic stability at least 75% of the time without holding breath.    Time 4    Period Weeks    Status Achieved    Target Date 01/20/22               PT Long Term Goals - 01/20/22 1132       PT LONG TERM GOAL #1   Title Pt will be ind with advanced HEP    Time 8    Period Weeks    Status Partially Met    Target Date 02/17/22      PT LONG TERM GOAL #2   Title pt to tolerate standing for at least 1 hour for improved ability to complete household tasks  without pain    Time 8    Period Weeks    Status Partially Met    Target Date 02/17/22      PT LONG TERM GOAL #3   Title Pt will demo proper bend, lift and carry x 20' of 10lb box with good core and body mechanics to simulate grocery and laundry tasks.    Time 8    Period Weeks    Status Partially Met    Target Date 02/17/22      PT LONG TERM GOAL #4   Title pt to demonstrate at least 4+/5 bil hip strength for improved functional squats for household tasks.    Time 8    Period Weeks    Status Partially Met    Target Date 02/17/22      PT LONG TERM GOAL #5   Title pt to report at least 27 on FOTO for improved percieved functional mobility.    Time 8    Period Weeks    Status Unable to assess    Target Date 02/17/22                   Plan - 01/20/22 1124     Clinical Impression Statement Pt continues to make good progress with decreasing low back pain; excellent report of mild increase in low back pain after a long day in which HEP quickly reduced discomfort. Increased resistance to deadlifts with good form and appropriate challenge; good tolerance to addition of heel raises and sidelying hip abduction to progress Bil LE strengthening. She will continue to benefit from skilled PT intervention in order to progress functional strengthening and work towards goal completion.    PT Treatment/Interventions ADLs/Self Care Home Management;Aquatic Therapy;Moist Heat;Functional mobility training;Therapeutic activities;Therapeutic exercise;Balance training;Neuromuscular re-education;Manual techniques;Patient/family education;Scar mobilization;Passive range of motion;Dry needling;Energy conservation;Stair training    PT Next Visit Plan Continue to progress functional strengthening; begin discharge planning with patient due to good progress.    [place] [id number]  Patient will benefit from skilled therapeutic intervention in order to improve the following  deficits and impairments:  Decreased endurance, Pain, Difficulty walking, Improper body mechanics, Decreased mobility, Impaired flexibility, Postural dysfunction, Decreased strength, Decreased activity tolerance  Visit Diagnosis: Muscle weakness (generalized)  Abnormality of gait and mobility  Abnormal posture  Acute right-sided low back pain without sciatica  Chronic midline low back pain without sciatica     Problem List Patient Active Problem List   Diagnosis Date Noted   Postsurgical hypothyroidism 11/04/2012   Multiple thyroid nodules 12/19/2011    Heather Roberts, PT, DPT02/05/2310:46 AM   [facility] @ [address] [place], [place], [address] Phone: [phone]   Fax:  [phone]  Name: Kimberly Ryan MRN: [id number] Date of Birth: 06/14/1951

## 2022-01-23 ENCOUNTER — Encounter: Payer: Self-pay | Admitting: Physical Therapy

## 2022-01-23 ENCOUNTER — Ambulatory Visit: Payer: PPO | Admitting: Physical Therapy

## 2022-01-23 ENCOUNTER — Other Ambulatory Visit: Payer: Self-pay

## 2022-01-23 DIAGNOSIS — M545 Low back pain, unspecified: Secondary | ICD-10-CM

## 2022-01-23 DIAGNOSIS — R293 Abnormal posture: Secondary | ICD-10-CM

## 2022-01-23 DIAGNOSIS — M6281 Muscle weakness (generalized): Secondary | ICD-10-CM

## 2022-01-23 DIAGNOSIS — G8929 Other chronic pain: Secondary | ICD-10-CM

## 2022-01-23 DIAGNOSIS — R269 Unspecified abnormalities of gait and mobility: Secondary | ICD-10-CM

## 2022-01-23 NOTE — Therapy (Signed)
[facility] @ [place], [place], [address] Phone: [phone]   Fax:  [phone]  Physical Therapy Treatment  Patient Details  Name: Kimberly Ryan MRN: [id number] Date of Birth: 1951/04/06 Referring Provider (PT): [name], MD   Encounter Date: 01/23/2022   PT End of Session - 01/23/22 1108     Visit Number 10    Date for PT Re-Evaluation 02/17/22    Authorization Type [employer]    Progress Note Due on Visit 10    PT Start Time 1106    PT Stop Time 1145    PT Time Calculation (min) 39 min    Activity Tolerance Patient tolerated treatment well    Behavior During Therapy [facility] for tasks assessed/performed             Past Medical History:  Diagnosis Date   Anxiety    Arthritis    GERD (gastroesophageal reflux disease)    Headache(784.0)    Shortness of breath    with exertion    Thyroid disease     Past Surgical History:  Procedure Laterality Date   MYOMECTOMY  2000   [place]   THYROID LOBECTOMY  08/17/2012   Procedure: THYROID LOBECTOMY;  Surgeon: [name], MD,FACS;  Location: [facility];  Service: General;  Laterality: Left;  Left Thyroid Lobectomy   TONSILLECTOMY  1954    There were no vitals filed for this visit.   Subjective Assessment - 01/23/22 1110     Subjective Having some pain in her right arm today due to tendonitis. I really don't want to pick up any kettle bells today.    Pertinent History had x-ray with Dr. [name] office and per pt report she had 2 vertebras with decreased disc spacing at Rt lower side.    Patient Stated Goals to have less pain and strengthen core    Currently in Pain? No/denies                               [facility] Adult PT Treatment/Exercise - 01/23/22 0001       Lumbar Exercises: Stretches   Active Hamstring Stretch Right;Left;1 rep;30 seconds    Active Hamstring Stretch Limitations seated     Piriformis Stretch Right;Left;1 rep;30 seconds    Piriformis Stretch Limitations seated      Lumbar Exercises: Aerobic   Nustep L3 x 4' PT present to discuss progress      Lumbar Exercises: Standing   Other Standing Lumbar Exercises Pallof press, 2 x 10 Bil, red      Lumbar Exercises: Supine   Bridge with Ball Squeeze Limitations 2x10, no pain with this today      Lumbar Exercises: Sidelying   Clam 20 reps;Right;Left    Clam Limitations attempted with yellow loop but too difficult    Hip Abduction 20 reps;Right;Left      Knee/Hip Exercises: Stretches   Piriformis Stretch --    Other Knee/Hip Stretches --      Knee/Hip Exercises: Standing   SLS with 3-way kick yellow band around thighs with transversus abdominus contraction and one  UE support, 10x each direction, Bil LE      Knee/Hip Exercises: Seated   Sit to Sand 10 reps;without UE support   yellow band at knees VCs for TA indraw     Shoulder Exercises: Standing   Extension Strengthening;Both;20 reps;Theraband  Theraband Level (Shoulder Extension) Level 3 (Green)    Row [employer];Theraband    Theraband Level (Shoulder Row) Level 3 Nyoka Cowden)                     PT Education - 01/23/22 1243     Education Details educated on wrist extension with 2# wt with eccentric lowering and also wrist extensor stretch    Person(s) Educated Patient    Methods Explanation;Demonstration;Verbal cues    Comprehension Verbalized understanding;Returned demonstration              PT Short Term Goals - 01/20/22 1131       PT SHORT TERM GOAL #1   Title Pt will be ind with initial HEP    Time 4    Period Weeks    Target Date 01/20/22      PT SHORT TERM GOAL #2   Title pt to demonstrate improved lifting mechanics with body weight without pain for decreased risl of re-injury    Time 4    Period Weeks    Status Achieved    Target Date 01/20/22      PT SHORT TERM GOAL #3   Title pt to demonstrate improved  core activiation with functional and strengthening tasks for improved back and pelvic stability at least 75% of the time without holding breath.    Time 4    Period Weeks    Status Achieved    Target Date 01/20/22               PT Long Term Goals - 01/20/22 1132       PT LONG TERM GOAL #1   Title Pt will be ind with advanced HEP    Time 8    Period Weeks    Status Partially Met    Target Date 02/17/22      PT LONG TERM GOAL #2   Title pt to tolerate standing for at least 1 hour for improved ability to complete household tasks without pain    Time 8    Period Weeks    Status Partially Met    Target Date 02/17/22      PT LONG TERM GOAL #3   Title Pt will demo proper bend, lift and carry x 20' of 10lb box with good core and body mechanics to simulate grocery and laundry tasks.    Time 8    Period Weeks    Status Partially Met    Target Date 02/17/22      PT LONG TERM GOAL #4   Title pt to demonstrate at least 4+/5 bil hip strength for improved functional squats for household tasks.    Time 8    Period Weeks    Status Partially Met    Target Date 02/17/22      PT LONG TERM GOAL #5   Title pt to report at least 71 on FOTO for improved percieved functional mobility.    Time 8    Period Weeks    Status Unable to assess    Target Date 02/17/22                   Plan - 01/23/22 1154     Clinical Impression Statement Pt reporting increased pain in right wrist common extensor tendon which she reports as chronic. She did not want to do any lifting today due to that. Wrist stretch and eccentric wrist extension demonstated for patient. She tolerated  all exercises without any pain in the low back or elbow. Able to increase resistance band with SLS 3 way kick but did require increased UE support.    PT Treatment/Interventions ADLs/Self Care Home Management;Aquatic Therapy;Moist Heat;Functional mobility training;Therapeutic activities;Therapeutic exercise;Balance  training;Neuromuscular re-education;Manual techniques;Patient/family education;Scar mobilization;Passive range of motion;Dry needling;Energy conservation;Stair training    PT Next Visit Plan Complete FOTO; Continue to progress functional strengthening; begin discharge planning with patient due to good progress.             Patient will benefit from skilled therapeutic intervention in order to improve the following deficits and impairments:  Decreased endurance, Pain, Difficulty walking, Improper body mechanics, Decreased mobility, Impaired flexibility, Postural dysfunction, Decreased strength, Decreased activity tolerance  Visit Diagnosis: Muscle weakness (generalized)  Abnormality of gait and mobility  Abnormal posture  Acute right-sided low back pain without sciatica  Chronic midline low back pain without sciatica     Problem List Patient Active Problem List   Diagnosis Date Noted   Postsurgical hypothyroidism 11/04/2012   Multiple thyroid nodules 12/19/2011    Madelyn Flavors, PT 01/23/2022, 12:46 PM  [facility] @ [place], [place], [address] Phone: [phone]   Fax:  [phone]  Name: Kimberly Ryan MRN: [id number] Date of Birth: 05/14/1951

## 2022-01-24 DIAGNOSIS — M810 Age-related osteoporosis without current pathological fracture: Secondary | ICD-10-CM | POA: Diagnosis not present

## 2022-01-27 ENCOUNTER — Other Ambulatory Visit: Payer: Self-pay

## 2022-01-27 ENCOUNTER — Ambulatory Visit: Payer: PPO

## 2022-01-27 DIAGNOSIS — G8929 Other chronic pain: Secondary | ICD-10-CM

## 2022-01-27 DIAGNOSIS — R269 Unspecified abnormalities of gait and mobility: Secondary | ICD-10-CM

## 2022-01-27 DIAGNOSIS — M545 Low back pain, unspecified: Secondary | ICD-10-CM

## 2022-01-27 DIAGNOSIS — M6281 Muscle weakness (generalized): Secondary | ICD-10-CM

## 2022-01-27 DIAGNOSIS — R293 Abnormal posture: Secondary | ICD-10-CM

## 2022-01-27 NOTE — Therapy (Signed)
[facility] @ [place], [place], [address] Phone: [phone]   Fax:  [phone]  Physical Therapy Treatment  Patient Details  Name: Kimberly Ryan MRN: [id number] Date of Birth: [birth year] Referring Provider (PT): [name], MD   Encounter Date: 01/27/2022   PT End of Session - 01/27/22 1118     Visit Number 11    Date for PT Re-Evaluation 02/17/22    Authorization Type [employer]    Progress Note Due on Visit 20    PT Start Time 1105    PT Stop Time 1143    PT Time Calculation (min) 38 min    Activity Tolerance Patient tolerated treatment well    Behavior During Therapy [facility] for tasks assessed/performed             Past Medical History:  Diagnosis Date   Anxiety    Arthritis    GERD (gastroesophageal reflux disease)    Headache(784.0)    Shortness of breath    with exertion    Thyroid disease     Past Surgical History:  Procedure Laterality Date   MYOMECTOMY  2000   [place]   THYROID LOBECTOMY  08/17/2012   Procedure: THYROID LOBECTOMY;  Surgeon: [name], MD,FACS;  Location: [facility];  Service: General;  Laterality: Left;  Left Thyroid Lobectomy   TONSILLECTOMY  1954    There were no vitals filed for this visit.   Subjective Assessment - 01/27/22 1106     Subjective Pt states that she continues to have exacerbation of Rt elbow/forearm pain and still does not want to pick up any pain.    Patient Stated Goals to have less pain and strengthen core    Currently in Pain? No/denies    Multiple Pain Sites No                [facility] PT Assessment - 01/27/22 0001       Observation/Other Assessments   Focus on Therapeutic Outcomes (FOTO)  83.5                           [facility] Adult PT Treatment/Exercise - 01/27/22 0001       Lumbar Exercises: Standing   Other Standing Lumbar Exercises Pallof press, 2 x 10 Bil, red      Lumbar  Exercises: Supine   Bridge with Ball Squeeze Limitations 2x10, no pain with this today      Lumbar Exercises: Sidelying   Clam 20 reps;Right;Left    Clam Limitations progressed by keeping to pLE lifted/feet separated just slightly    Hip Abduction 20 reps;Right;Left      Knee/Hip Exercises: Standing   SLS with 3-way kick yellow band around thighs with transversus abdominus contraction and one  UE support, 10x each direction, Bil LE - standing on airex today to challenge core/balance    Other Standing Knee Exercises Marching on airex 3 x 10      Knee/Hip Exercises: Seated   Sit to Sand 10 reps;without UE support      Shoulder Exercises: Standing   Extension Strengthening;Both;20 reps;Theraband    Row Strengthening;20 reps;Theraband    Theraband Level (Shoulder Row) Level 3 Nyoka Cowden)                     PT Education - 01/27/22 1121     Education Details Pt education performed on exercise  progressions. We discussed benefit of PT for Rt elbow pain and speaking with MD about referral if pain continues.    Person(s) Educated Patient    Methods Explanation;Demonstration;Tactile cues;Verbal cues    Comprehension Verbalized understanding              PT Short Term Goals - 01/20/22 1131       PT SHORT TERM GOAL #1   Title Pt will be ind with initial HEP    Time 4    Period Weeks    Target Date 01/20/22      PT SHORT TERM GOAL #2   Title pt to demonstrate improved lifting mechanics with body weight without pain for decreased risl of re-injury    Time 4    Period Weeks    Status Achieved    Target Date 01/20/22      PT SHORT TERM GOAL #3   Title pt to demonstrate improved core activiation with functional and strengthening tasks for improved back and pelvic stability at least 75% of the time without holding breath.    Time 4    Period Weeks    Status Achieved    Target Date 01/20/22               PT Long Term Goals - 01/20/22 1132       PT LONG TERM  GOAL #1   Title Pt will be ind with advanced HEP    Time 8    Period Weeks    Status Partially Met    Target Date 02/17/22      PT LONG TERM GOAL #2   Title pt to tolerate standing for at least 1 hour for improved ability to complete household tasks without pain    Time 8    Period Weeks    Status Partially Met    Target Date 02/17/22      PT LONG TERM GOAL #3   Title Pt will demo proper bend, lift and carry x 20' of 10lb box with good core and body mechanics to simulate grocery and laundry tasks.    Time 8    Period Weeks    Status Partially Met    Target Date 02/17/22      PT LONG TERM GOAL #4   Title pt to demonstrate at least 4+/5 bil hip strength for improved functional squats for household tasks.    Time 8    Period Weeks    Status Partially Met    Target Date 02/17/22      PT LONG TERM GOAL #5   Title pt to report at least 58 on FOTO for improved percieved functional mobility.    Time 8    Period Weeks    Status Unable to assess    Target Date 02/17/22                   Plan - 01/27/22 1122     Clinical Impression Statement Pt is continuing to make good progress with minimal back pain and only with certain movements throughout the week. She did well with all previous progresions and addition of resistance to several exercises with appropriate challenge and maintaining good form. FOTO given today and pt demosntrated improvement to score of 83.5. She was encouraged to speak with MD about Rt elbow pain and PT referral. She will continue to benefit from skilled PT intervention in order to progress functional strengthening and work towards goal completion.  PT Treatment/Interventions ADLs/Self Care Home Management;Aquatic Therapy;Moist Heat;Functional mobility training;Therapeutic activities;Therapeutic exercise;Balance training;Neuromuscular re-education;Manual techniques;Patient/family education;Scar mobilization;Passive range of motion;Dry needling;Energy  conservation;Stair training    PT Next Visit Plan Continue to progress functional strengthening; begin discharge planning with patient due to good progress.    PT Home Exercise Plan [email or web address]    Consulted and Agree with Plan of Care Patient             Patient will benefit from skilled therapeutic intervention in order to improve the following deficits and impairments:  Decreased endurance, Pain, Difficulty walking, Improper body mechanics, Decreased mobility, Impaired flexibility, Postural dysfunction, Decreased strength, Decreased activity tolerance  Visit Diagnosis: Muscle weakness (generalized)  Abnormality of gait and mobility  Abnormal posture  Acute right-sided low back pain without sciatica  Chronic midline low back pain without sciatica     Problem List Patient Active Problem List   Diagnosis Date Noted   Postsurgical hypothyroidism 11/04/2012   Multiple thyroid nodules 12/19/2011    Heather Roberts, PT, DPT02/13/231:43 PM   [place] @ [place], [place], [address] Phone: [phone]   Fax:  [phone]  Name: ARMEDA PLUMB MRN: [id number] Date of Birth: 1951-03-27

## 2022-01-30 ENCOUNTER — Other Ambulatory Visit: Payer: Self-pay

## 2022-01-30 ENCOUNTER — Ambulatory Visit: Payer: PPO

## 2022-01-30 DIAGNOSIS — M6281 Muscle weakness (generalized): Secondary | ICD-10-CM

## 2022-01-30 DIAGNOSIS — M545 Low back pain, unspecified: Secondary | ICD-10-CM

## 2022-01-30 DIAGNOSIS — R269 Unspecified abnormalities of gait and mobility: Secondary | ICD-10-CM

## 2022-01-30 DIAGNOSIS — R293 Abnormal posture: Secondary | ICD-10-CM

## 2022-01-30 NOTE — Therapy (Signed)
[facility] @ [place] [place], [place], [address] Phone: [phone]   Fax:  [phone]  Physical Therapy Treatment  Patient Details  Name: Kimberly Ryan MRN: [id number] Date of Birth: [birth year] Referring Provider (PT): [name], MD   Encounter Date: 01/30/2022   PT End of Session - 01/30/22 1109     Visit Number 12    Date for PT Re-Evaluation 02/17/22    Authorization Type [employer]    Progress Note Due on Visit 20    PT Start Time 1108    PT Stop Time 1142    PT Time Calculation (min) 34 min    Activity Tolerance Patient tolerated treatment well    Behavior During Therapy [facility] for tasks assessed/performed             Past Medical History:  Diagnosis Date   Anxiety    Arthritis    GERD (gastroesophageal reflux disease)    Headache(784.0)    Shortness of breath    with exertion    Thyroid disease     Past Surgical History:  Procedure Laterality Date   MYOMECTOMY  2000   [place]   THYROID LOBECTOMY  08/17/2012   Procedure: THYROID LOBECTOMY;  Surgeon: [name], MD,FACS;  Location: [facility];  Service: General;  Laterality: Left;  Left Thyroid Lobectomy   TONSILLECTOMY  1954    There were no vitals filed for this visit.   Subjective Assessment - 01/30/22 1112     Subjective Pt reports no pain currently and has not had any painful episodes over the last several days.    Patient Stated Goals to have less pain and strengthen core    Currently in Pain? No/denies    Multiple Pain Sites No                               [facility] Adult PT Treatment/Exercise - 01/30/22 0001       Lumbar Exercises: Supine   Dead Bug Limitations Hollow body hold isometric with pressure on bil thighs and neutral back, 2 x 20 sec    Bridge with clamshell 10 reps    Bridge with March 10 reps      Lumbar Exercises: Quadruped   [place]/Old Horse 20 reps    Opposite  Arm/Leg Raise Right arm/Left leg;Left arm/Right leg;10 reps    Other Quadruped Lumbar Exercises Fire hydrant 10x bil      Knee/Hip Exercises: Standing   Heel Raises Both;2 sets;10 reps   on airex   SLS with 3-way kick with transversus abdominus contraction and one  UE support, 10x each direction, Bil LE - standing on airex today to challenge core/balance    Other Standing Knee Exercises Marching on airex 3 x 10                     PT Education - 01/30/22 1117     Education Details Pt education performed on exercise progressions. HEP updated.    Person(s) Educated Patient    Methods Explanation;Demonstration;Tactile cues;Verbal cues;Handout    Comprehension Verbalized understanding              PT Short Term Goals - 01/20/22 1131       PT SHORT TERM GOAL #1   Title Pt will be ind with initial HEP    Time 4    Period  Weeks    Target Date 01/20/22      PT SHORT TERM GOAL #2   Title pt to demonstrate improved lifting mechanics with body weight without pain for decreased risl of re-injury    Time 4    Period Weeks    Status Achieved    Target Date 01/20/22      PT SHORT TERM GOAL #3   Title pt to demonstrate improved core activiation with functional and strengthening tasks for improved back and pelvic stability at least 75% of the time without holding breath.    Time 4    Period Weeks    Status Achieved    Target Date 01/20/22               PT Long Term Goals - 01/20/22 1132       PT LONG TERM GOAL #1   Title Pt will be ind with advanced HEP    Time 8    Period Weeks    Status Partially Met    Target Date 02/17/22      PT LONG TERM GOAL #2   Title pt to tolerate standing for at least 1 hour for improved ability to complete household tasks without pain    Time 8    Period Weeks    Status Partially Met    Target Date 02/17/22      PT LONG TERM GOAL #3   Title Pt will demo proper bend, lift and carry x 20' of 10lb box with good core and body  mechanics to simulate grocery and laundry tasks.    Time 8    Period Weeks    Status Partially Met    Target Date 02/17/22      PT LONG TERM GOAL #4   Title pt to demonstrate at least 4+/5 bil hip strength for improved functional squats for household tasks.    Time 8    Period Weeks    Status Partially Met    Target Date 02/17/22      PT LONG TERM GOAL #5   Title pt to report at least 80 on FOTO for improved percieved functional mobility.    Time 8    Period Weeks    Status Unable to assess    Target Date 02/17/22                   Plan - 01/30/22 1117     Clinical Impression Statement Pt continues to make great progress with no aggravation of symptoms this week. She was able to make many exercise progressions in supine, quadruped, and standing today in order to progress balance/core strength and functional hip/back strengthening. She will continue to benefit from skilled PT intervention in order to progress functional strengthening and independence with HEP.    PT Treatment/Interventions ADLs/Self Care Home Management;Aquatic Therapy;Moist Heat;Functional mobility training;Therapeutic activities;Therapeutic exercise;Balance training;Neuromuscular re-education;Manual techniques;Patient/family education;Scar mobilization;Passive range of motion;Dry needling;Energy conservation;Stair training    PT Next Visit Plan Continue to progress functional strengthening to tolerance.    PT Home Exercise Plan [email or web address]    Consulted and Agree with Plan of Care Patient             Patient will benefit from skilled therapeutic intervention in order to improve the following deficits and impairments:  Decreased endurance, Pain, Difficulty walking, Improper body mechanics, Decreased mobility, Impaired flexibility, Postural dysfunction, Decreased strength, Decreased activity tolerance  Visit Diagnosis: Muscle weakness (generalized)  Abnormality of gait and mobility  Abnormal  posture  Acute right-sided low back pain without sciatica  Chronic midline low back pain without sciatica     Problem List Patient Active Problem List   Diagnosis Date Noted   Postsurgical hypothyroidism 11/04/2012   Multiple thyroid nodules 12/19/2011    Heather Roberts, PT, DPT02/16/2311:45 AM   [facility] @ [place], [place], [address] Phone: [phone]   Fax:  [phone]  Name: Kimberly Ryan MRN: [id number] Date of Birth: 02/21/1951

## 2022-01-30 NOTE — Patient Instructions (Signed)
Access Code: [email or web address] URL: [email or web address] Date: 01/30/2022 Prepared by: Heather Roberts  Exercises Seated Piriformis Stretch with Trunk Bend - 1 x daily - 7 x weekly - 1 sets - 2-3 reps - 30-45s holds Supine Piriformis Stretch - 1 x daily - 7 x weekly - 1 sets - 2-3 reps - 30-45s holds Seated Hamstring Stretch - 1 x daily - 7 x weekly - 1 sets - 2-3 reps - 30-45s holds Supine Hip Adductor Stretch - 1 x daily - 7 x weekly - 1 sets - 2-3 reps - 30-45s holds Standing Lumbar Spine Flexion Stretch Counter - 1 x daily - 7 x weekly - 1 sets - 2-3 reps - 30-45s holds Sit to Stand - 1 x daily - 7 x weekly - 1 sets - 10 reps Standing Row with Anchored Resistance - 1 x daily - 7 x weekly - 1 sets - 10 reps Supine Lower Trunk Rotation - 1 x daily - 7 x weekly - 3 sets - 10 reps Hooklying Single Knee to Chest - 1 x daily - 7 x weekly - 3 sets - 10 reps Standing 3-Way Kick - 1 x daily - 7 x weekly - 2 sets - 10 reps Half Deadlift with Kettlebell - 1 x daily - 7 x weekly - 2 sets - 10 reps Side Stepping with Resistance at Thighs - 1 x daily - 7 x weekly - 2 sets - 10 reps Shoulder Extension with Resistance - 1 x daily - 7 x weekly - 2 sets - 10 reps Marching Bridge - 1 x daily - 7 x weekly - 3 sets - 10 reps Bridge with Hip Abduction and Resistance - 1 x daily - 7 x weekly - 3 sets - 10 reps Quadruped Fire Hydrant - 1 x daily - 7 x weekly - 3 sets - 10 reps Bird Dog - 1 x daily - 7 x weekly - 3 sets - 10 reps

## 2022-02-03 ENCOUNTER — Ambulatory Visit: Payer: PPO

## 2022-02-03 ENCOUNTER — Other Ambulatory Visit: Payer: Self-pay

## 2022-02-03 DIAGNOSIS — M6281 Muscle weakness (generalized): Secondary | ICD-10-CM | POA: Diagnosis not present

## 2022-02-03 DIAGNOSIS — G8929 Other chronic pain: Secondary | ICD-10-CM

## 2022-02-03 DIAGNOSIS — M545 Low back pain, unspecified: Secondary | ICD-10-CM

## 2022-02-03 DIAGNOSIS — R269 Unspecified abnormalities of gait and mobility: Secondary | ICD-10-CM

## 2022-02-03 DIAGNOSIS — R293 Abnormal posture: Secondary | ICD-10-CM

## 2022-02-03 NOTE — Therapy (Signed)
[facility] @ [place], [place], [address] Phone: [phone]   Fax:  [phone]  Physical Therapy Treatment  Patient Details  Name: Kimberly Ryan MRN: [id number] Date of Birth: [birth year] Referring Provider (PT): [name], MD   Encounter Date: 02/03/2022   PT End of Session - 02/03/22 1120     Visit Number 13    Date for PT Re-Evaluation 02/17/22    Authorization Type [employer]    Progress Note Due on Visit 20    PT Start Time 1106    PT Stop Time 1144    PT Time Calculation (min) 38 min    Activity Tolerance Patient tolerated treatment well    Behavior During Therapy [facility] for tasks assessed/performed             Past Medical History:  Diagnosis Date   Anxiety    Arthritis    GERD (gastroesophageal reflux disease)    Headache(784.0)    Shortness of breath    with exertion    Thyroid disease     Past Surgical History:  Procedure Laterality Date   MYOMECTOMY  2000   [place]   THYROID LOBECTOMY  08/17/2012   Procedure: THYROID LOBECTOMY;  Surgeon: [name], MD,FACS;  Location: [facility];  Service: General;  Laterality: Left;  Left Thyroid Lobectomy   TONSILLECTOMY  1954    There were no vitals filed for this visit.   Subjective Assessment - 02/03/22 1121     Subjective Pt states that she is a little more tired today than usual. Shedenies any back pain and states that she continues to notice improvements.    Patient Stated Goals to have less pain and strengthen core    Currently in Pain? No/denies    Multiple Pain Sites No                               [facility] Adult PT Treatment/Exercise - 02/03/22 0001       Lumbar Exercises: Standing   Other Standing Lumbar Exercises Pallof press, 2 x 10 Bil, red      Lumbar Exercises: Supine   Dead Bug 20 reps   breathing and cues for core facilitation   Bridge with clamshell 10 reps;2 seconds     Bridge with March 10 reps      Lumbar Exercises: Quadruped   Madcat/Old Horse 20 reps    Opposite Arm/Leg Raise Right arm/Left leg;Left arm/Right leg;10 reps    Other Quadruped Lumbar Exercises Fire hydrant 2 x 10 bil      Knee/Hip Exercises: Standing   Heel Raises Both;2 sets;10 reps   on airex   SLS with 3-way kick with transversus abdominus contraction and one  UE support, 10x each direction, Bil LE - standing on airex today to challenge core/balance    Other Standing Knee Exercises Marching on airex 3 x 10                     PT Education - 02/03/22 1122     Education Details Consolidation of HEP and planned to include "A day" and "B day" to help reduce time spent each day performing exercises. Pt education performed on all exercises.    Person(s) Educated Patient    Methods Explanation;Demonstration;Tactile cues;Verbal cues;Handout    Comprehension Verbalized understanding  PT Short Term Goals - 01/20/22 1131       PT SHORT TERM GOAL #1   Title Pt will be ind with initial HEP    Time 4    Period Weeks    Target Date 01/20/22      PT SHORT TERM GOAL #2   Title pt to demonstrate improved lifting mechanics with body weight without pain for decreased risl of re-injury    Time 4    Period Weeks    Status Achieved    Target Date 01/20/22      PT SHORT TERM GOAL #3   Title pt to demonstrate improved core activiation with functional and strengthening tasks for improved back and pelvic stability at least 75% of the time without holding breath.    Time 4    Period Weeks    Status Achieved    Target Date 01/20/22               PT Long Term Goals - 01/20/22 1132       PT LONG TERM GOAL #1   Title Pt will be ind with advanced HEP    Time 8    Period Weeks    Status Partially Met    Target Date 02/17/22      PT LONG TERM GOAL #2   Title pt to tolerate standing for at least 1 hour for improved ability to complete household tasks  without pain    Time 8    Period Weeks    Status Partially Met    Target Date 02/17/22      PT LONG TERM GOAL #3   Title Pt will demo proper bend, lift and carry x 20' of 10lb box with good core and body mechanics to simulate grocery and laundry tasks.    Time 8    Period Weeks    Status Partially Met    Target Date 02/17/22      PT LONG TERM GOAL #4   Title pt to demonstrate at least 4+/5 bil hip strength for improved functional squats for household tasks.    Time 8    Period Weeks    Status Partially Met    Target Date 02/17/22      PT LONG TERM GOAL #5   Title pt to report at least 37 on FOTO for improved percieved functional mobility.    Time 8    Period Weeks    Status Unable to assess    Target Date 02/17/22                   Plan - 02/03/22 1122     Clinical Impression Statement Pt continues to make good progress demosntrated by decreasing frequency of painful episodes and improvingstrength demonstrated by ability to progress core/hip strengthening with good core control and no increase in pain. She was agreeable to HEP plan, splitting it up over 2 days and help was provided on how to organize this to inlcude strengthening and stretching exercises daily - she wasencouraged to also take a rest day where she is only walking. She will continue to benefit from skilled PT intervention in order to progress functional strengthening and independence with HEP.    PT Treatment/Interventions ADLs/Self Care Home Management;Aquatic Therapy;Moist Heat;Functional mobility training;Therapeutic activities;Therapeutic exercise;Balance training;Neuromuscular re-education;Manual techniques;Patient/family education;Scar mobilization;Passive range of motion;Dry needling;Energy conservation;Stair training    PT Next Visit Plan Continue to progress functional strengthening to tolerance.    PT Home Exercise Plan  [id number]    Consulted and Agree with Plan of Care Patient              Patient will benefit from skilled therapeutic intervention in order to improve the following deficits and impairments:  Decreased endurance, Pain, Difficulty walking, Improper body mechanics, Decreased mobility, Impaired flexibility, Postural dysfunction, Decreased strength, Decreased activity tolerance  Visit Diagnosis: Muscle weakness (generalized)  Abnormality of gait and mobility  Abnormal posture  Acute right-sided low back pain without sciatica  Chronic midline low back pain without sciatica     Problem List Patient Active Problem List   Diagnosis Date Noted   Postsurgical hypothyroidism 11/04/2012   Multiple thyroid nodules 12/19/2011    Heather Roberts, PT, DPT02/20/2311:46 AM   [facility] @ [place], [place], [address] Phone: [phone]   Fax:  [phone]  Name: Kimberly Ryan MRN: [id number] Date of Birth: Feb 08, 1951

## 2022-02-04 DIAGNOSIS — M79631 Pain in right forearm: Secondary | ICD-10-CM | POA: Diagnosis not present

## 2022-02-04 DIAGNOSIS — N3941 Urge incontinence: Secondary | ICD-10-CM | POA: Diagnosis not present

## 2022-02-04 DIAGNOSIS — M779 Enthesopathy, unspecified: Secondary | ICD-10-CM | POA: Diagnosis not present

## 2022-02-06 ENCOUNTER — Ambulatory Visit: Payer: PPO

## 2022-02-10 ENCOUNTER — Encounter: Payer: PPO | Attending: Family Medicine | Admitting: Dietician

## 2022-02-10 ENCOUNTER — Other Ambulatory Visit: Payer: Self-pay

## 2022-02-10 DIAGNOSIS — R7303 Prediabetes: Secondary | ICD-10-CM | POA: Insufficient documentation

## 2022-02-10 NOTE — Progress Notes (Signed)
On 02/10/2022 patient completed Core Session 1 of Diabetes Prevention Program course virtually with [employer] [facility] [employer] [facility]. The following learning objectives were met by the patient during this class:   Virtual Visit via Video Note  I connected with Kimberly Ryan, Dec 25, 1950 by a video enabled application and verified that I am speaking with the correct person using two identifiers.   I discussed the limitations of evaluation and management by telemedicine and the availability of in person appointments. The patient expressed understanding and agreed to proceed.  Location: Patient: Virtual Provider: Office   Learning Objectives:  Be able to explain the purpose and benefits of the National Diabetes Prevention Program.  Be able to describe the events that will take place at every session.  Know the weight loss and physical activity goals established by the [facility] Diabetes Prevention Program.  Know their own individual weight loss and physical activity goals.  Be able to explain the important effect of self-monitoring on behavior change.   Goals:  Record food and beverage intake in "Food and Activity Tracker" over the next week.  E-mail completed "Food and Activity Tracker" to Lifestyle Coach next week before session 2. Circle the foods or beverages you think are highest in fat and calories in your food tracker. Read the labels on the food you buy, and consider using measuring cups and spoons to help you calculate the amount you eat. We will talk about measuring in more detail in the coming weeks.   Follow-Up Plan: Attend Core Session 2 next week.  E-mail completed "Food and Activity Tracker" to Lifestyle Coach next week before class.

## 2022-02-11 ENCOUNTER — Other Ambulatory Visit: Payer: Self-pay

## 2022-02-11 ENCOUNTER — Ambulatory Visit: Payer: PPO | Admitting: Physical Therapy

## 2022-02-11 ENCOUNTER — Encounter: Payer: Self-pay | Admitting: Physical Therapy

## 2022-02-11 DIAGNOSIS — M6281 Muscle weakness (generalized): Secondary | ICD-10-CM

## 2022-02-11 DIAGNOSIS — R269 Unspecified abnormalities of gait and mobility: Secondary | ICD-10-CM

## 2022-02-11 DIAGNOSIS — M545 Low back pain, unspecified: Secondary | ICD-10-CM

## 2022-02-11 DIAGNOSIS — R293 Abnormal posture: Secondary | ICD-10-CM

## 2022-02-11 DIAGNOSIS — G8929 Other chronic pain: Secondary | ICD-10-CM

## 2022-02-11 NOTE — Therapy (Signed)
[facility] @ [place], [place], [address] Phone: [phone]   Fax:  [phone]  Physical Therapy Treatment  Patient Details  Name: Kimberly Ryan MRN: [id number] Date of Birth: 29-Nov-1951 Referring Provider (PT): [name], MD   Encounter Date: 02/11/2022   PT End of Session - 02/11/22 0935     Visit Number 14    Date for PT Re-Evaluation 02/17/22    Authorization Type [employer]    Progress Note Due on Visit 20    PT Start Time 0935    PT Stop Time 1018    PT Time Calculation (min) 43 min    Activity Tolerance Patient tolerated treatment well    Behavior During Therapy [facility] for tasks assessed/performed             Past Medical History:  Diagnosis Date   Anxiety    Arthritis    GERD (gastroesophageal reflux disease)    Headache(784.0)    Shortness of breath    with exertion    Thyroid disease     Past Surgical History:  Procedure Laterality Date   MYOMECTOMY  2000   [place]   THYROID LOBECTOMY  08/17/2012   Procedure: THYROID LOBECTOMY;  Surgeon: [name], MD,FACS;  Location: [facility];  Service: General;  Laterality: Left;  Left Thyroid Lobectomy   TONSILLECTOMY  1954    There were no vitals filed for this visit.   Subjective Assessment - 02/11/22 0935     Subjective My Rt forearm tendinitis is bad and Lt knee has new pain due to a misstep so I need to be careful.  Overall my back is 90% better.    Pertinent History had x-ray with Dr. [name] office and per pt report she had 2 vertebras with decreased disc spacing at Rt lower side.    How long can you sit comfortably? no limits    How long can you stand comfortably? no limits    How long can you walk comfortably? no limits usually does 30 min walk every day    Diagnostic tests had x-ray with Dr. [name] office and per pt report she had 2 vertebras with decreased disc spacing at Rt lower side.    Patient  Stated Goals to have less pain and strengthen core    Currently in Pain? No/denies   not in my back                              [facility] Adult PT Treatment/Exercise - 02/11/22 0001       Exercises   Exercises Shoulder;Lumbar;Knee/Hip      Lumbar Exercises: Stretches   Active Hamstring Stretch Left;Right;1 rep;30 seconds    Active Hamstring Stretch Limitations seated      Lumbar Exercises: Standing   Other Standing Lumbar Exercises Pallof press, red, 20 reps each way      Lumbar Exercises: Seated   Other Seated Lumbar Exercises hip flexion isometric 5x5"    Other Seated Lumbar Exercises pelvic ROM circles, tucks, tail wags x 10 each      Lumbar Exercises: Supine   Dead Bug 20 reps   breathing and cues for core facilitation   Bridge with [employer];10 reps;2 seconds    Bridge with clamshell 10 reps;2 seconds    Bridge with [employer] Limitations red loop    Bridge with March 10  reps      Lumbar Exercises: Quadruped   Madcat/Old Horse 15 reps    Opposite Arm/Leg Raise Right arm/Left leg;Left arm/Right leg;10 reps    Other Quadruped Lumbar Exercises Fire hydrant 2 x 10 bil      Knee/Hip Exercises: Standing   Heel Raises Both;2 sets;10 reps    Heel Raises Limitations no airex pad today due to knee pain    Hip Flexion AROM;Both;20 reps;Knee bent    Hip Flexion Limitations march tap 6" step, single UE support      Knee/Hip Exercises: Seated   Clamshell with [employer] Yellow   2x20     Shoulder Exercises: Therapy Ball   Other Therapy Ball Exercises shoulder extension seated on ball                       PT Short Term Goals - 01/20/22 1131       PT SHORT TERM GOAL #1   Title Pt will be ind with initial HEP    Time 4    Period Weeks    Target Date 01/20/22      PT SHORT TERM GOAL #2   Title pt to demonstrate improved lifting mechanics with body weight without pain for decreased risl of re-injury    Time 4    Period Weeks     Status Achieved    Target Date 01/20/22      PT SHORT TERM GOAL #3   Title pt to demonstrate improved core activiation with functional and strengthening tasks for improved back and pelvic stability at least 75% of the time without holding breath.    Time 4    Period Weeks    Status Achieved    Target Date 01/20/22               PT Long Term Goals - 01/20/22 1132       PT LONG TERM GOAL #1   Title Pt will be ind with advanced HEP    Time 8    Period Weeks    Status Partially Met    Target Date 02/17/22      PT LONG TERM GOAL #2   Title pt to tolerate standing for at least 1 hour for improved ability to complete household tasks without pain    Time 8    Period Weeks    Status Partially Met    Target Date 02/17/22      PT LONG TERM GOAL #3   Title Pt will demo proper bend, lift and carry x 20' of 10lb box with good core and body mechanics to simulate grocery and laundry tasks.    Time 8    Period Weeks    Status Partially Met    Target Date 02/17/22      PT LONG TERM GOAL #4   Title pt to demonstrate at least 4+/5 bil hip strength for improved functional squats for household tasks.    Time 8    Period Weeks    Status Partially Met    Target Date 02/17/22      PT LONG TERM GOAL #5   Title pt to report at least 87 on FOTO for improved percieved functional mobility.    Time 8    Period Weeks    Status Unable to assess    Target Date 02/17/22                   Plan -  02/11/22 0949     Clinical Impression Statement Pt reports 90% improvement in LBP.  She did arrive with acute Lt knee and Rt forearm pain so PT adjusted ther ex according to tolerance.  She demo'd bridge series and quadruped ther ex challenges with excellent trunk stabilization.  She needed intermittent cueing to coordinate breathwork.  Avoided standing on airex pad for standing ther ex today due to acute Lt knee pain.  She has new PT Rx for other body regions and given good pain management  and HEP independence we will plan to d/c lumbar next visit.    Comorbidities previous episodes of low back pain.    PT Next Visit Plan ERO and d/c lumbar    PT Home Exercise Plan [id number]    Consulted and Agree with Plan of Care Patient             Patient will benefit from skilled therapeutic intervention in order to improve the following deficits and impairments:     Visit Diagnosis: Muscle weakness (generalized)  Abnormality of gait and mobility  Abnormal posture  Acute right-sided low back pain without sciatica  Chronic midline low back pain without sciatica     Problem List Patient Active Problem List   Diagnosis Date Noted   Postsurgical hypothyroidism 11/04/2012   Multiple thyroid nodules 12/19/2011    Baruch Merl, PT 02/11/22 10:20 AM   [facility] @ [place], [place], [address] Phone: [phone]   Fax:  [phone]  Name: Kimberly Ryan MRN: [id number] Date of Birth: 29-Apr-1951

## 2022-02-13 ENCOUNTER — Other Ambulatory Visit: Payer: Self-pay

## 2022-02-13 ENCOUNTER — Ambulatory Visit: Payer: PPO | Attending: Family Medicine | Admitting: Physical Therapy

## 2022-02-13 ENCOUNTER — Encounter: Payer: Self-pay | Admitting: Physical Therapy

## 2022-02-13 DIAGNOSIS — R293 Abnormal posture: Secondary | ICD-10-CM | POA: Diagnosis not present

## 2022-02-13 DIAGNOSIS — R269 Unspecified abnormalities of gait and mobility: Secondary | ICD-10-CM | POA: Insufficient documentation

## 2022-02-13 DIAGNOSIS — R279 Unspecified lack of coordination: Secondary | ICD-10-CM | POA: Insufficient documentation

## 2022-02-13 DIAGNOSIS — M545 Low back pain, unspecified: Secondary | ICD-10-CM | POA: Diagnosis not present

## 2022-02-13 DIAGNOSIS — M79631 Pain in right forearm: Secondary | ICD-10-CM | POA: Diagnosis not present

## 2022-02-13 DIAGNOSIS — M6281 Muscle weakness (generalized): Secondary | ICD-10-CM | POA: Diagnosis not present

## 2022-02-13 DIAGNOSIS — G8929 Other chronic pain: Secondary | ICD-10-CM | POA: Insufficient documentation

## 2022-02-13 NOTE — Therapy (Signed)
[facility] ?[facility] @ [place] [facility], [place], [address] ?Phone: [phone]   Fax:  [phone] ? ?Physical Therapy Treatment ? ?Patient Details  ?Name: Kimberly Ryan ?MRN: [id number] ?Date of Birth: 10-Apr-1951 ?Referring Provider (PT): [name], MD ? ? ?Encounter Date: 02/13/2022 ? ? PT End of Session - 02/13/22 1103   ? ? Visit Number 15   ? Date for PT Re-Evaluation 02/17/22   ? Authorization Type [employer]   ? Progress Note Due on Visit 20   ? PT Start Time 1104   ? PT Stop Time 1145   ? PT Time Calculation (min) 41 min   ? Activity Tolerance Patient tolerated treatment well   ? Behavior During Therapy [facility] for tasks assessed/performed   ? ?  ?  ? ?  ? ? ?Past Medical History:  ?Diagnosis Date  ? Anxiety   ? Arthritis   ? GERD (gastroesophageal reflux disease)   ? Headache(784.0)   ? Shortness of breath   ? with exertion   ? Thyroid disease   ? ? ?Past Surgical History:  ?Procedure Laterality Date  ? MYOMECTOMY  2000  ? RHINOPLASTY  1953  ? THYROID LOBECTOMY  08/17/2012  ? Procedure: THYROID LOBECTOMY;  Surgeon: [name], MD,FACS;  Location: [facility];  Service: General;  Laterality: Left;  Left Thyroid Lobectomy  ? TONSILLECTOMY  1954  ? ? ?There were no vitals filed for this visit. ? ? Subjective Assessment - 02/13/22 1121   ? ? Subjective Pt reports 90% improvement in back pain and feels much stronger.  Can now stand up to 1 hour.  Is ind with HEP and ready to d/c PT for lumbar spine/hip pain.   ? Pertinent History had x-ray with Dr. [name] office and per pt report she had 2 vertebras with decreased disc spacing at Rt lower side.   ? How long can you sit comfortably? no limits   ? How long can you stand comfortably? 1 hour   ? How long can you walk comfortably? no limits usually does 30 min walk every day   ? Diagnostic tests had x-ray with Dr. [name] office and per pt report she had 2 vertebras with decreased disc spacing at Rt  lower side.   ? Patient Stated Goals to have less pain and strengthen core   ? Currently in Pain? No/denies   ? ?  ?  ? ?  ? ? ? ? ? [facility] PT Assessment - 02/13/22 0001   ? ?  ? Assessment  ? Medical Diagnosis M54.51 (ICD-10-CM) - Vertebrogenic low back pain   ? Referring Provider (PT) [name], MD   ? Onset Date/Surgical Date --   3 weeks  ? Prior Therapy yes for back pain   ?  ? Observation/Other Assessments  ? Focus on Therapeutic Outcomes (FOTO)  83% (original score 79%)   ?  ? ROM / Strength  ? AROM / PROM / Strength Strength   ?  ? Strength  ? Strength Assessment Site Hip   ? Right/Left Hip Right;Left   ? Right Hip Flexion 5/5   ? Right Hip Extension 5/5   ? Right Hip External Rotation  5/5   ? Right Hip Internal Rotation 5/5   ? Right Hip ABduction 5/5   ? Right Hip ADduction 5/5   ? Left Hip Flexion 5/5   ? Left Hip Extension 5/5   ? Left Hip  External Rotation 5/5   ? Left Hip Internal Rotation 5/5   ? Left Hip ABduction 5/5   ? Left Hip ADduction 5/5   ? ?  ?  ? ?  ? ? ? ? ? ? ? ? ? ? ? ? ? ? ? ? [facility] Adult PT Treatment/Exercise - 02/13/22 0001   ? ?  ? Lumbar Exercises: Aerobic  ? Nustep L1 x 2' PT present to monitor and discuss goals   ?  ? Lumbar Exercises: Standing  ? Heel Raises 20 reps   ? Heel Raises Limitations 2x10   ? Other Standing Lumbar Exercises Pallof press, double red band, 2x10 reps each way   ?  ? Lumbar Exercises: Seated  ? Long [employer] on Chair Strengthening;Both;20 reps   ?  ? Lumbar Exercises: Supine  ? Dead Bug 20 reps   breathing and cues for core facilitation  ? Bridge with [employer];10 reps;2 seconds   ? Bridge with clamshell 10 reps;2 seconds   ? Bridge with [employer] Limitations yellow loop   ? Bridge with March 10 reps   ?  ? Lumbar Exercises: Quadruped  ? Madcat/Old Horse 10 reps   ? Opposite Arm/Leg Raise Right arm/Left leg;Left arm/Right leg;10 reps   ? Other Quadruped Lumbar Exercises Fire hydrant 2x5   ? ?  ?  ? ?  ? ? ? ? ? ? ? ? ? ? ? ? PT Short  Term Goals - 01/20/22 1131   ? ?  ? PT SHORT TERM GOAL #1  ? Title Pt will be ind with initial HEP   ? Time 4   ? Period Weeks   ? Target Date 01/20/22   ?  ? PT SHORT TERM GOAL #2  ? Title pt to demonstrate improved lifting mechanics with body weight without pain for decreased risl of re-injury   ? Time 4   ? Period Weeks   ? Status Achieved   ? Target Date 01/20/22   ?  ? PT SHORT TERM GOAL #3  ? Title pt to demonstrate improved core activiation with functional and strengthening tasks for improved back and pelvic stability at least 75% of the time without holding breath.   ? Time 4   ? Period Weeks   ? Status Achieved   ? Target Date 01/20/22   ? ?  ?  ? ?  ? ? ? ? PT Long Term Goals - 02/13/22 1113   ? ?  ? PT LONG TERM GOAL #1  ? Title Pt will be ind with advanced HEP   ? Status Achieved   ?  ? PT LONG TERM GOAL #2  ? Title pt to tolerate standing for at least 1 hour for improved ability to complete household tasks without pain   ? Status Achieved   ?  ? PT LONG TERM GOAL #3  ? Title Pt will demo proper bend, lift and carry x 20' of 10lb box with good core and body mechanics to simulate grocery and laundry tasks.   ? Status Achieved   ?  ? PT LONG TERM GOAL #4  ? Title pt to demonstrate at least 4+/5 bil hip strength for improved functional squats for household tasks.   ? Baseline 5/5   ? Status Achieved   ?  ? PT LONG TERM GOAL #5  ? Title pt to report at least 38 on FOTO for improved percieved functional mobility.   ? Baseline  83%   ? Status Achieved   ? ?  ?  ? ?  ? ? ? ? ? ? ? ? Plan - 02/13/22 1148   ? ? Clinical Impression Statement Pt is ready to d/c to HEP.  She has met all LTGs including achieving 5/5 bil hip strength, ability to stand x 1 hour for household tasks, FOTO score of 83% (Stage 5 of 5 functional ability), and ind with HEP.  She demos stabilization of trunk with overlay of UE/LE movement challenges.  She reports she feels much stronger in her low back and doesn't pitch forward with fatigue as  she works around the house.  D/C to HEP.  Pt will be getting PT for other diagnoses with an evaluation scheduled next week.   ? Comorbidities previous episodes of low back pain.   ? PT Frequency 2x / week   ? PT Duration 8 weeks   ? PT Treatment/Interventions ADLs/Self Care Home Management;Aquatic Therapy;Moist Heat;Functional mobility training;Therapeutic activities;Therapeutic exercise;Balance training;Neuromuscular re-education;Manual techniques;Patient/family education;Scar mobilization;Passive range of motion;Dry needling;Energy conservation;Stair training   ? PT Next Visit Plan d/c to HEP   ? [place] [id number]   ? Consulted and Agree with Plan of Care Patient   ? ?  ?  ? ?  ? ? ?Patient will benefit from skilled therapeutic intervention in order to improve the following deficits and impairments:    ? ?Visit Diagnosis: ?Muscle weakness (generalized) ? ?Abnormality of gait and mobility ? ?Abnormal posture ? ?Acute right-sided low back pain without sciatica ? ?Chronic midline low back pain without sciatica ? ? ? ? ?Problem List ?Patient Active Problem List  ? Diagnosis Date Noted  ? Postsurgical hypothyroidism 11/04/2012  ? Multiple thyroid nodules 12/19/2011  ? ? ?PHYSICAL THERAPY DISCHARGE SUMMARY ? ?Visits from Start of Care: 15 ? ?Current functional level related to goals / functional outcomes: ?See above ?  ?Remaining deficits: ?See above ?  ?Education / Equipment: ?HEP ? ?Patient agrees to discharge. Patient goals were met. Patient is being discharged due to meeting the stated rehab goals. ? ?Baruch Merl, PT ?02/13/22 11:53 AM ? ?[facility] ?[facility] @ [place] ?[place], [place], [address] ?Phone: [phone]   Fax:  [phone] ? ?Name: LINZIE CRISS ?MRN: [id number] ?Date of Birth: 1951-11-28 ? ? ? ?

## 2022-02-17 ENCOUNTER — Encounter: Payer: PPO | Attending: Family Medicine | Admitting: Dietician

## 2022-02-17 ENCOUNTER — Other Ambulatory Visit: Payer: Self-pay

## 2022-02-17 DIAGNOSIS — R7303 Prediabetes: Secondary | ICD-10-CM | POA: Insufficient documentation

## 2022-02-17 NOTE — Progress Notes (Signed)
On 02/17/2022 patient completed Core Session 2 of Diabetes Prevention Program course virtually with [facility]. The following learning objectives were met by the patient during this class:  ? ?Virtual Visit via Video Note ? ?I connected with Kimberly Ryan, 10-Oct-1951 on 02/17/22 at 10:30 AM EST by a video enabled application and verified that I am speaking with the correct person using two identifiers. ?  ?I discussed the limitations of evaluation and management by telemedicine and the availability of in person appointments. The patient expressed understanding and agreed to proceed. ? ?Location: ?Patient: Virtual ?Provider: Office ? ?Learning Objectives: ?Self-monitor their weight during the weeks following Session 2.  ?Describe the relationship between fat and calories.  ?Explain the reason for, and basic principles of, self-monitoring fat grams and calories.  ?Identify their personal fat gram goals.  ?Use the ?Fat and Calorie Counter to calculate the calories and fat grams of a given selection of foods.  ?Keep a running total of the fat grams they eat each day.  ?Calculate fat, calories, and serving sizes from nutrition labels.  ? ?Goals:  ?Weigh yourself at the same time each day, or every few days, and record your weight in your Food and Activity Tracker. ?Write down everything you eat and drink in your Food and Activity Tracker. ?Measure portions as much as you can, and start reading labels.  ?Use the ?Fat and Calorie Counter to figure out the amount of fat and calories in what you ate, and write the amount down in your Food and Activity Tracker. ?Keep a running fat gram total throughout the day. Come as close to your fat gram goal as you can.  ? ?Follow-Up Plan: ?Attend Core Session 3 next week.  ?Email completed  "Food and Activity Tracker" to Lifestyle Coach next week.  ? ?

## 2022-02-20 ENCOUNTER — Ambulatory Visit: Payer: PPO | Admitting: Physical Therapy

## 2022-02-20 ENCOUNTER — Encounter: Payer: Self-pay | Admitting: Physical Therapy

## 2022-02-20 ENCOUNTER — Other Ambulatory Visit: Payer: Self-pay

## 2022-02-20 DIAGNOSIS — R293 Abnormal posture: Secondary | ICD-10-CM

## 2022-02-20 DIAGNOSIS — R279 Unspecified lack of coordination: Secondary | ICD-10-CM

## 2022-02-20 DIAGNOSIS — M6281 Muscle weakness (generalized): Secondary | ICD-10-CM

## 2022-02-20 DIAGNOSIS — M79631 Pain in right forearm: Secondary | ICD-10-CM

## 2022-02-20 NOTE — Patient Instructions (Signed)
Urge Incontinence  Ideal urination frequency is every 2-4 wakeful hours, which equates to 5-8 times within a 24-hour period.   Urge incontinence is leakage that occurs when the bladder muscle contracts, creating a sudden need to go before getting to the bathroom.   Going too often when your bladder isn't actually full can disrupt the body's automatic signals to store and hold urine longer, which will increase urgency/frequency.  In this case, the bladder "is running the show" and strategies can be learned to retrain this pattern.   One should be able to control the first urge to urinate, at around 150mL.  The bladder can hold up to a "grande latte," or 400mL. To help you gain control, practice the Urge Drill below when urgency strikes.  This drill will help retrain your bladder signals and allow you to store and hold urine longer.  The overall goal is to stretch out your time between voids to reach a more manageable voiding schedule.    Practice your "quick flicks" often throughout the day (each waking hour) even when you don't need feel the urge to go.  This will help strengthen your pelvic floor muscles, making them more effective in controlling leakage.  Urge Drill  When you feel an urge to go, follow these steps to regain control: Stop what you are doing and be still Take one deep breath, directing your air into your abdomen Think an affirming thought, such as "I've got this." Do 5 quick flicks of your pelvic floor Walk with control to the bathroom to void, or delay voiding   Bladder Irritants  Certain foods and beverages can be irritating to the bladder.  Avoiding these irritants may decrease your symptoms of urinary urgency, frequency or bladder pain.  Even reducing your intake can help with your symptoms.  Not everyone is sensitive to all bladder irritants, so you may consider focusing on one irritant at a time, removing or reducing your intake of that irritant for 7-10 days to see if  this change helps your symptoms.  Water intake is also very important.  Below is a list of bladder irritants.  Drinks: alcohol, carbonated beverages, caffeinated beverages such as coffee and tea, drinks with artificial sweeteners, citrus juices, apple juice, tomato juice  Foods: tomatoes and tomato based foods, spicy food, sugar and artificial sweeteners, vinegar, chocolate, raw onion, apples, citrus fruits, pineapple, cranberries, tomatoes, strawberries, plums, peaches, cantaloupe  Other: acidic urine (too concentrated) - see water intake info below  Substitutes you can try that are NOT irritating to the bladder: cooked onion, pears, papayas, sun-brewed decaf teas, watermelons, non-citrus herbal teas, apricots, kava and low-acid instant drinks (Postum).    WATER INTAKE: Remember to drink lots of water (aim for fluid intake of half your body weight with 2/3 of fluids being water).  You may be limiting fluids due to fear of leakage, but this can actually worsen urgency symptoms due to highly concentrated urine.  Water helps balance the pH of your urine so it doesn't become too acidic - acidic urine is a bladder irritant!    

## 2022-02-20 NOTE — Therapy (Signed)
OUTPATIENT PHYSICAL THERAPY FEMALE PELVIC EVALUATION   Patient Name: Kimberly Ryan MRN: [id number] DOB:[birth year], 71 y.o., female Today's Date: 02/20/2022   PT End of Session - 02/20/22 1241     Visit Number 1    Date for PT Re-Evaluation 05/23/22    Authorization Type [employer]    Progress Note Due on Visit 20    PT Start Time 1235   pt arrival time   PT Stop Time 1313    PT Time Calculation (min) 38 min    Activity Tolerance Patient tolerated treatment well    Behavior During Therapy WFL for tasks assessed/performed             Past Medical History:  Diagnosis Date   Anxiety    Arthritis    GERD (gastroesophageal reflux disease)    Headache(784.0)    Shortness of breath    with exertion    Thyroid disease    Past Surgical History:  Procedure Laterality Date   MYOMECTOMY  2000   [place]   THYROID LOBECTOMY  08/17/2012   Procedure: THYROID LOBECTOMY;  Surgeon: [name], MD,FACS;  Location: [facility];  Service: General;  Laterality: Left;  Left Thyroid Lobectomy   TONSILLECTOMY  1954   Patient Active Problem List   Diagnosis Date Noted   Postsurgical hypothyroidism 11/04/2012   Multiple thyroid nodules 12/19/2011    PCP: Leighton Ruff, MD (Inactive)  REFERRING PROVIDER: Faustino Congress, NP  REFERRING DIAG: [phone] (ICD-10-CM) - Pain in right forearm  05/23/2022 M77.9 (ICD-10-CM) - Enthesopathy, unspecified N39.41 (ICD-10-CM) - Urge incontinence  THERAPY DIAG:  Muscle weakness (generalized)  Abnormal posture  Unspecified lack of coordination  Pain in right forearm  ONSET DATE: Rt forearm pain started 20-30years ago but goes away and comes back intermittently   Pelvic floor: urge incontinence 12 years  SUBJECTIVE:                                                                                                                                                                                           SUBJECTIVE  STATEMENT: Noticed her Rt forearm started hurting with playing piano at church and pain from lateral elbow into thumb or lateral wrist Fluid intake: Yes: 40-50oz  and 2 cups of coffee    Patient confirms identification and approves PT to assess pelvic floor and treatment Yes  PERTINENT HISTORY:   Sexual abuse: No  PAIN:  Are you having pain? Yes NPRS scale: 6/10 and at rest 0/10 Pain location:  Rt forearm  Pain type: aching Pain description: intermittent   Aggravating factors: playing piano, gripping small items,  repetitive movements  Relieving factors: rest, ice   BOWEL MOVEMENT Pain with bowel movement: No Type of bowel movement:Type (Bristol Stool Scale) 4-5, Frequency 2x daily, and Strain Yes sometime but not always does have strong urgency Fully empty rectum: No Leakage: Yes: if it is firm no leakage, but waiting too long with softer stools will have smearing sometimes Pads: No Fiber supplement: No  URINATION Pain with urination: No Fully empty bladder: Yes:   Stream: Strong Urgency: Yes:   Frequency: 1-1.5 hours in morning and 2 hours Leakage: Urge to void and Walking to the bathroom Pads: No  INTERCOURSE Pain with intercourse:  none Ability to have vaginal penetration:  Yes:   Does have lichens and uses cream for this, used every 2-3 days Climax: Yes:   Marinoff Scale 0  PREGNANCY Never been pregnant  PROLAPSE None  PRECAUTIONS: None and Other:    WEIGHT BEARING RESTRICTIONS No  FALLS:  Has patient fallen in last 6 months? No, Number of falls: 0  LIVING ENVIRONMENT: Lives with: lives with their family Lives in: House/apartment Has following equipment at home: None  OCCUPATION: English as a second language teacher, and works at [employer]  PLOF: DISH to have less leakage and less arm pain   OBJECTIVE:     COGNITION:  Overall cognitive status: Within functional limits for tasks assessed     SENSATION:  Light touch: Appears  intact  Proprioception: Appears intact  MUSCLE LENGTH: Bil hamstrings and adductors limited by 25%   POSTURE:  Rounded shoulders, posterior pelvic tilt  PALPATION: Internal Pelvic Floor deferred until next session  External Perineal Exam deferred until next session  GENERAL TTP at Rt lateral forearm, (+) pain with resisted Rt wrist extension, (+) pain with passive ROM for pronation and wrist extension  ROM UE: LT UE all WFL  Rt UE: shoulder WFL; elbow WFL; wrist WFL however pain at end range in all directions and extension to neutral actively   MMT UE: LT UE all WFL  Rt UE: shoulder WFL; elbow WFL however pain with flexion; wrist ext 3+/5; flexion 4/5; abduction and adduction 3+/5     PELVIC MMT: deferred until next session   MMT  02/20/2022  Vaginal   Internal Anal Sphincter   External Anal Sphincter   Puborectalis   Diastasis Recti   (Blank rows = not tested)   TONE: deferred until next session  PROLAPSE: deferred until next session       TODAY'S TREATMENT  EVAL 02/20/2022 exam completed, exam findings reviewed, HEP given for mobility at Rt wrist to address initially to decrease pain. Urge drill and bladder irritants also given to address urge incontinence.    PATIENT EDUCATION:  Education details: HEP, POC, urge drill, and bladder irritants Person educated: Patient Education method: Explanation, Demonstration, Tactile cues, Verbal cues, and Handouts Education comprehension: verbalized understanding and returned demonstration   HOME EXERCISE PROGRAM: [id number]  ASSESSMENT:  CLINICAL IMPRESSION: Patient is a 71 y.o. female  who was seen today for physical therapy evaluation and treatment for urge incontinence and Rt forearm pain.    OBJECTIVE IMPAIRMENTS decreased activity tolerance, decreased coordination, decreased endurance, decreased mobility, decreased strength, increased fascial restrictions, impaired perceived functional ability, increased  muscle spasms, impaired flexibility, improper body mechanics, postural dysfunction, prosthetic dependency , and pain.   ACTIVITY LIMITATIONS community activity, driving, meal prep, occupation, laundry, yard work, and shopping.   PERSONAL FACTORS Time since onset of injury/illness/exacerbation are also affecting patient's functional outcome.    REHAB  POTENTIAL: Good  CLINICAL DECISION MAKING: Stable/uncomplicated  EVALUATION COMPLEXITY: Low   GOALS: Goals reviewed with patient? Yes  SHORT TERM GOALS:  Pt to be I with HEP.  Baseline:  Target date: 03/20/2022 Goal status: INITIAL  2.  Pt to demonstrate at least 3/5 internal pelvic floor strength for improved pelvic stability and decreased leakage. Baseline:  Target date: 03/20/2022 Goal status: INITIAL  3.  Pt to report no more than 3/10 pain at Rt forearm for improved ability to complete piano lessens with less pain.  Baseline:  Target date: 03/20/2022 Goal status: INITIAL  4.  Pt to report no more than one instance of urinary leakage per week for improved QOL and decreased symptoms.  Baseline:  Target date: 03/20/2022 Goal status: INITIAL    LONG TERM GOALS:   Pt to be I with advanced HEP.  Baseline:  Target date: 05/23/2022 Goal status: INITIAL  2.  Pt to demonstrate at least 4/5 internal pelvic floor strength for improved pelvic stability and decreased leakage. Baseline:  Target date: 05/23/2022 Goal status: INITIAL  3.  Pt to report no more than 1/10 pain at Rt forearm for improved ability to complete piano lessens with less pain.  Baseline:  Target date: 05/23/2022 Goal status: INITIAL  4.  Pt to report no more than one instance of urinary leakage per month for improved QOL and decreased symptoms.  Baseline:  Target date: 05/23/2022 Goal status: INITIAL 5.  Pt to report ability to wait at least 2.5 hours between bladder voids to decrease urinary urge and frequency to restroom for more functional times.  Baseline:   Target date: 05/23/2022 Goal status: INITIAL PLAN: PT FREQUENCY: 2x/week  PT DURATION: 8 weeks  PLANNED INTERVENTIONS: Therapeutic exercises, Therapeutic activity, Neuromuscular re-education, Patient/Family education, Joint manipulation, Joint mobilization, Dry Needling, Electrical stimulation, Cryotherapy, Moist heat, Manual lymph drainage, Compression bandaging, scar mobilization, Taping, Ultrasound, Ionotophoresis '4mg'$ /ml Dexamethasone, and Manual therapy  PLAN FOR NEXT SESSION: internal pelvic floor assessment if pt agreeable   Junie Panning, PT 02/20/2022, 12:43 PM

## 2022-02-24 ENCOUNTER — Other Ambulatory Visit: Payer: Self-pay

## 2022-02-24 ENCOUNTER — Encounter (HOSPITAL_BASED_OUTPATIENT_CLINIC_OR_DEPARTMENT_OTHER): Payer: PPO | Admitting: Dietician

## 2022-02-24 DIAGNOSIS — R7303 Prediabetes: Secondary | ICD-10-CM | POA: Diagnosis not present

## 2022-02-24 NOTE — Progress Notes (Signed)
On 02/24/2022 patient completed Core Session 3 of Diabetes Prevention Program course virtually with [facility]. The following learning objectives were met by the patient during this class:  ? ?Virtual Visit via Video Note ? ?I connected with Rodena Piety L. Trumpler-Rich, 1951/11/04 on 02/24/22 at 10:30 AM EDT by a video enabled application and verified that I am speaking with the correct person using two identifiers. ? ?Location: ?Patient: Virtual ?Provider: Office ? ?Learning Objectives: ?Weigh and measure foods. ?Estimate the fat and calorie content of common foods. ?Describe three ways to eat less fat and fewer calories. ?Create a plan to eat less fat for the following week.  ? ?Goals:  ?Track weight when weighing outside of class.  ?Track food and beverages eaten each day in Food and Activity Tracker and include fat grams and calories for each.  ?Try to stay within fat gram goal.  ?Complete plan for eating less high fat foods and answer related homework questions.  ? ? ?Follow-Up Plan: ?Attend Core Session 4 next week.  ?Bring completed "Food and Activity Tracker" next week to be reviewed by Lifestyle Coach.  ? ?

## 2022-02-25 ENCOUNTER — Other Ambulatory Visit: Payer: Self-pay

## 2022-02-25 ENCOUNTER — Ambulatory Visit: Payer: PPO | Admitting: Physical Therapy

## 2022-02-25 DIAGNOSIS — M6281 Muscle weakness (generalized): Secondary | ICD-10-CM

## 2022-02-25 DIAGNOSIS — R293 Abnormal posture: Secondary | ICD-10-CM

## 2022-02-25 DIAGNOSIS — R279 Unspecified lack of coordination: Secondary | ICD-10-CM

## 2022-02-25 NOTE — Therapy (Signed)
?OUTPATIENT PHYSICAL THERAPY TREATMENT NOTE ? ? ?Patient Name: Kimberly Ryan ?MRN: [id number] ?DOB:June 21, 1951, 71 y.o., female ?Today's Date: 02/25/2022 ? ?PCP: Leighton Ruff, MD (Inactive) ?REFERRING PROVIDER: Faustino Congress, NP ? ? PT End of Session - 02/25/22 1245   ? ? Visit Number 2   ? Date for PT Re-Evaluation 05/23/22   ? Authorization Type [employer]   ? Progress Note Due on Visit 20   ? PT Start Time 1148   ? PT Stop Time 1236   ? PT Time Calculation (min) 48 min   ? Activity Tolerance Patient tolerated treatment well   ? Behavior During Therapy [facility] for tasks assessed/performed   ? ?  ?  ? ?  ? ? ?Past Medical History:  ?Diagnosis Date  ? Anxiety   ? Arthritis   ? GERD (gastroesophageal reflux disease)   ? Headache(784.0)   ? Shortness of breath   ? with exertion   ? Thyroid disease   ? ?Past Surgical History:  ?Procedure Laterality Date  ? MYOMECTOMY  2000  ? RHINOPLASTY  1953  ? THYROID LOBECTOMY  08/17/2012  ? Procedure: THYROID LOBECTOMY;  Surgeon: [name], MD,FACS;  Location: [facility];  Service: General;  Laterality: Left;  Left Thyroid Lobectomy  ? TONSILLECTOMY  1954  ? ?Patient Active Problem List  ? Diagnosis Date Noted  ? Postsurgical hypothyroidism 11/04/2012  ? Multiple thyroid nodules 12/19/2011  ? ? ?REFERRING DIAG: M79.631 (ICD-10-CM) - Pain in right forearm  05/23/2022 ?M77.9 (ICD-10-CM) - Enthesopathy, unspecified ?N39.41 (ICD-10-CM) - Urge incontinence ? ?THERAPY DIAG:  ?Muscle weakness (generalized) ? ?Unspecified lack of coordination ? ?Abnormal posture ? ?PERTINENT HISTORY: nothing significant  ? ?PRECAUTIONS: none ? ?SUBJECTIVE: Pt reports she has been increasing water intake,  ? ?PAIN:  ?Are you having pain? No ? ? ? ? ? ?OBJECTIVE:  ?  ?  ?  ?COGNITION: ?           Overall cognitive status: Within functional limits for tasks assessed              ?            ?SENSATION: ?           Light touch: Appears intact ?           Proprioception: Appears intact ?   ?MUSCLE LENGTH: ?Bil hamstrings and adductors limited by 25% ?  ?  ?POSTURE:  ?Rounded shoulders, posterior pelvic tilt ?  ?PALPATION: ?Internal Pelvic Floor deferred until next session ?  ?External Perineal Exam deferred until next session ?  ?GENERAL TTP at Rt lateral forearm, (+) pain with resisted Rt wrist extension, (+) pain with passive ROM for pronation and wrist extension ?  ?ROM UE: LT UE all WFL ?           Rt UE: shoulder WFL; elbow WFL; wrist WFL however pain at end range in all directions and extension to neutral actively  ?  ?MMT UE: LT UE all WFL ?           Rt UE: shoulder WFL; elbow WFL however pain with flexion; wrist ext 3+/5; flexion 4/5; abduction and adduction 3+/5 ?  ?  ?  ?  ?PELVIC MMT: deferred until next session ?  ?MMT   ?02/20/2022  ?Vaginal  3/5; 5s isometric; 3 reps   ?Internal Anal Sphincter    ?External Anal Sphincter    ?Puborectalis    ?Diastasis Recti    ?(Blank rows =  not tested) ?  ?  ?TONE: ?Slightly decreased ?  ?PROLAPSE: ?None noted in hooklying at this time 02/25/2022  ?  ?  ?  ?  ?  ?  ?TODAY'S TREATMENT  ?EVAL 02/20/2022 exam completed, exam findings reviewed, HEP given for mobility at Rt wrist to address initially to decrease pain. Urge drill and bladder irritants also given to address urge incontinence.  ? ?02/25/2022 Treatment: Pt consented to internal pelvic floor assessment this date vaginally. Pt directed in x5 pelvic floor contractions vaginally with VC for breathing and core activations and to decrease compensatory strategies. Pt also directed in 2x8s isometric holds with cues for breathing, and 5Y09 quick flicks. Pt demonstrated decreased strength, endurance, and coordination with pelvic floor muscles and benefited from cues to improve technique. Pt also educated in proper pelvic floor mobility technique throughout session and updated HEP to include pelvic floor exercises for home. Pt directed in additional pelvic floor contractions x10; 3x8s isometric holds, 9I33  quick flicks all in sitting and on small towel roll for improved feedback of tissue mobility. Pt reported this helped and better understood how to complete at home. Pt also given and educated on bladder diary to attempt at home.  ?  ?  ?PATIENT EDUCATION:  ?Education details: HEP, bladder diary, technique for pelvic floor mobility ?Person educated: Patient ?Education method: Explanation, Demonstration, Tactile cues, Verbal cues, and Handouts ?Education comprehension: verbalized understanding and returned demonstration ?  ?  ?[place]: ?[id number] ?  ?ASSESSMENT: ?  ?CLINICAL IMPRESSION: ?Pt presents today for follow up treatment, consented to internal pelvic floor assessment vaginally. Pt denied pain throughout, demonstrated decreased strength, endurance, coordination with 3/5 strength, 5s holds, and decreased speed and consistency of quick flicks. Pt did have redness at perineal area however pt does have h/o lichens and this is being followed and treated with MD aware. Pt session focused on vaginal assessment, NMRE with internal feedback for improved technique with cues for pelvic floor mobility without compensatory strategies. Pt then directed in pelvic floor strengthening exercises in sitting after assessment where pt reported improved understanding how to complete these for home. Pt tolerated well, no pain and denied additional questions at end of session. Pt would benefit from additional PT to further address deficits.  ? ?  ?  ?OBJECTIVE IMPAIRMENTS decreased activity tolerance, decreased coordination, decreased endurance, decreased mobility, decreased strength, increased fascial restrictions, impaired perceived functional ability, increased muscle spasms, impaired flexibility, improper body mechanics, postural dysfunction, prosthetic dependency , and pain.  ?  ?ACTIVITY LIMITATIONS community activity, driving, meal prep, occupation, laundry, yard work, and shopping.  ?  ?PERSONAL FACTORS Time since  onset of injury/illness/exacerbation are also affecting patient's functional outcome.  ?  ?  ?REHAB POTENTIAL: Good ?  ?CLINICAL DECISION MAKING: Stable/uncomplicated ?  ?EVALUATION COMPLEXITY: Low ?  ?  ?GOALS: ?Goals reviewed with patient? Yes ?  ?SHORT TERM GOALS: ?  ?Pt to be I with HEP.  ?Baseline:  ?Target date: 03/20/2022 ?Goal status: INITIAL ?  ?2.  Pt to demonstrate at least 3/5 internal pelvic floor strength for improved pelvic stability and decreased leakage. ?Baseline:  ?Target date: 03/20/2022 ?Goal status: INITIAL ?  ?3.  Pt to report no more than 3/10 pain at Rt forearm for improved ability to complete piano lessens with less pain.  ?Baseline:  ?Target date: 03/20/2022 ?Goal status: INITIAL ?  ?4.  Pt to report no more than one instance of urinary leakage per week for improved QOL and decreased symptoms.  ?  Baseline:  ?Target date: 03/20/2022 ?Goal status: INITIAL ?  ?  ?  ?LONG TERM GOALS: ?  ?  ?Pt to be I with advanced HEP.  ?Baseline:  ?Target date: 05/23/2022 ?Goal status: INITIAL ?  ?2.  Pt to demonstrate at least 4/5 internal pelvic floor strength for improved pelvic stability and decreased leakage. ?Baseline:  ?Target date: 05/23/2022 ?Goal status: INITIAL ?  ?3.  Pt to report no more than 1/10 pain at Rt forearm for improved ability to complete piano lessens with less pain.  ?Baseline:  ?Target date: 05/23/2022 ?Goal status: INITIAL ?  ?4.  Pt to report no more than one instance of urinary leakage per month for improved QOL and decreased symptoms.  ?Baseline:  ?Target date: 05/23/2022 ?Goal status: INITIAL ?5.  Pt to report ability to wait at least 2.5 hours between bladder voids to decrease urinary urge and frequency to restroom for more functional times.  ?Baseline:  ?Target date: 05/23/2022 ?Goal status: INITIAL ?PLAN: ?PT FREQUENCY: 2x/week ?  ?PT DURATION: 8 weeks ?  ?PLANNED INTERVENTIONS: Therapeutic exercises, Therapeutic activity, Neuromuscular re-education, Patient/Family education, Joint  manipulation, Joint mobilization, Dry Needling, Electrical stimulation, Cryotherapy, Moist heat, Manual lymph drainage, Compression bandaging, scar mobilization, Taping, Ultrasound, Ionotophoresis '4mg'$ /ml Dexamethasone

## 2022-02-26 ENCOUNTER — Encounter: Payer: Self-pay | Admitting: Physical Therapy

## 2022-02-26 ENCOUNTER — Ambulatory Visit: Payer: PPO | Admitting: Physical Therapy

## 2022-02-26 DIAGNOSIS — M6281 Muscle weakness (generalized): Secondary | ICD-10-CM | POA: Diagnosis not present

## 2022-02-26 DIAGNOSIS — M79631 Pain in right forearm: Secondary | ICD-10-CM

## 2022-02-26 NOTE — Therapy (Signed)
?OUTPATIENT PHYSICAL THERAPY TREATMENT NOTE ? ? ?Patient Name: Kimberly Ryan ?MRN: [id number] ?DOB:1951-01-07, 71 y.o., female ?Today's Date: 02/26/2022 ? ?PCP: Leighton Ruff, MD (Inactive) ?REFERRING PROVIDER: No ref. provider found ? ? PT End of Session - 02/26/22 1334   ? ? Visit Number 3   ? Date for PT Re-Evaluation 05/23/22   ? Authorization Type [employer]   ? Progress Note Due on Visit 20   ? PT Start Time 1147   ? PT Stop Time 1235   ? PT Time Calculation (min) 48 min   ? Activity Tolerance Patient tolerated treatment well   ? Behavior During Therapy [facility] for tasks assessed/performed   ? ?  ?  ? ?  ? ? ? ?Past Medical History:  ?Diagnosis Date  ? Anxiety   ? Arthritis   ? GERD (gastroesophageal reflux disease)   ? Headache(784.0)   ? Shortness of breath   ? with exertion   ? Thyroid disease   ? ?Past Surgical History:  ?Procedure Laterality Date  ? MYOMECTOMY  2000  ? RHINOPLASTY  1953  ? THYROID LOBECTOMY  08/17/2012  ? Procedure: THYROID LOBECTOMY;  Surgeon: [name], MD,FACS;  Location: [facility];  Service: General;  Laterality: Left;  Left Thyroid Lobectomy  ? TONSILLECTOMY  1954  ? ?Patient Active Problem List  ? Diagnosis Date Noted  ? Postsurgical hypothyroidism 11/04/2012  ? Multiple thyroid nodules 12/19/2011  ? ? ?REFERRING DIAG: M79.631 (ICD-10-CM) - Pain in right forearm  05/23/2022 ?M77.9 (ICD-10-CM) - Enthesopathy, unspecified ?N39.41 (ICD-10-CM) - Urge incontinence ? ?THERAPY DIAG:  ?Pain in right forearm ? ?Muscle weakness (generalized) ? ?PERTINENT HISTORY: nothing significant  ? ?PRECAUTIONS: none ? ?SUBJECTIVE: The new exercises may be helping my forearm.   ? ?PAIN:  ?PAIN:  ?Are you having pain? Yes ?NPRS scale: 4/10 ?Pain location: Rt forearm ?Pain orientation: Right and Posterior  ?PAIN TYPE: aching ?Pain description: sharp and aching  ?Aggravating factors: gripping, lifting, fine and gross grip ?Relieving factors: ice  ? ? ? ? ? ?OBJECTIVE: FROM INITIAL EVALUATION  02/13/22 ?  ?COGNITION: ?           Overall cognitive status: Within functional limits for tasks assessed              ?            ?SENSATION: ?           Light touch: Appears intact ?           Proprioception: Appears intact ?  ?MUSCLE LENGTH: ?Bil hamstrings and adductors limited by 25% ?  ?  ?POSTURE:  ?Rounded shoulders, posterior pelvic tilt ?  ?PALPATION: ?  ?GENERAL TTP at Rt lateral forearm, (+) pain with resisted Rt wrist extension, (+) pain with passive ROM for pronation and wrist extension ?  ?ROM UE: LT UE all WFL ?           Rt UE: shoulder WFL; elbow WFL; wrist WFL however pain at end range in all directions and extension to neutral actively  ?  ?MMT UE: LT UE all WFL ?           Rt UE: shoulder WFL; elbow WFL however pain with flexion; wrist ext 3+/5; flexion 4/5; abduction and adduction 3+/5 ?  ?  ?  ?  ?PELVIC MMT: deferred until next session ?  ?MMT   ?02/20/2022  ?Vaginal  3/5; 5s isometric; 3 reps   ?Internal Anal Sphincter    ?  External Anal Sphincter    ?Puborectalis    ?Diastasis Recti    ?(Blank rows = not tested) ?  ?  ?TONE: ?Slightly decreased ?  ?PROLAPSE: ?None noted in hooklying at this time 02/26/2022  ?  ?  ?  ?  ?  ?  ?TODAY'S TREATMENT  ?02/26/22: (ortho appointment for Rt elbow pain) ?STM Rt wrist extensors and tendons ?Wrist pronation, supination and wrist extensor stretch, Rt x 30" each ?Discussion about benefits of elbow tendonitis brace and reviewed options online ?Demo of self-massage options to wrist extensors ?Seated with forearm propped: A/ROM x 10 each: wrist circles each way, pron/sup, wrist ext with Lt UE assist into ext, Rt eccentric lowering ?Ice stick massage to Rt ECRB/L tendons x 2', explanation of ice cup massage option x 2' at home as needed for heightened pain days ?Ionto #1 to Rt common wrist extensor tendon with care instructions ? ? ?EVAL 02/20/2022 exam completed, exam findings reviewed, HEP given for mobility at Rt wrist to address initially to decrease pain. Urge  drill and bladder irritants also given to address urge incontinence.  ? ?02/25/2022 Treatment: Pt consented to internal pelvic floor assessment this date vaginally. Pt directed in x5 pelvic floor contractions vaginally with VC for breathing and core activations and to decrease compensatory strategies. Pt also directed in 2x8s isometric holds with cues for breathing, and 0Y30 quick flicks. Pt demonstrated decreased strength, endurance, and coordination with pelvic floor muscles and benefited from cues to improve technique. Pt also educated in proper pelvic floor mobility technique throughout session and updated HEP to include pelvic floor exercises for home. Pt directed in additional pelvic floor contractions x10; 3x8s isometric holds, 1S01 quick flicks all in sitting and on small towel roll for improved feedback of tissue mobility. Pt reported this helped and better understood how to complete at home. Pt also given and educated on bladder diary to attempt at home.  ?  ?  ?PATIENT EDUCATION:  ?Education details:  ?02/25/22: HEP, bladder diary, technique for pelvic floor mobility ?02/26/22: Discussion about benefits of elbow tendonitis brace and reviewed options online, self-massage to tendons and muscles of forearm, ice cup massage x 2' as needed for more painful days ?Person educated: Patient ?Education method: Explanation, Demonstration, Tactile cues, Verbal cues, and Handouts ?Education comprehension: verbalized understanding and returned demonstration ?  ?  ?[place]: ?[id number] ?  ?ASSESSMENT: ?  ?CLINICAL IMPRESSION: ? Pt education provided today for Rt wrist extensor self-massage, ice massage, and elbow tendonitis brace options.  Reviewed HEP and did not advance to any resistance training at this time as stretching and A/ROM seem to be Pt's limits for now.  Pt with good response to STM and ice massage today.  Continue along POC. ?  ?  ?OBJECTIVE IMPAIRMENTS decreased activity tolerance, decreased  coordination, decreased endurance, decreased mobility, decreased strength, increased fascial restrictions, impaired perceived functional ability, increased muscle spasms, impaired flexibility, improper body mechanics, postural dysfunction, prosthetic dependency , and pain.  ?  ?ACTIVITY LIMITATIONS community activity, driving, meal prep, occupation, laundry, yard work, and shopping.  ?  ?PERSONAL FACTORS Time since onset of injury/illness/exacerbation are also affecting patient's functional outcome.  ?  ?  ?REHAB POTENTIAL: Good ?  ?CLINICAL DECISION MAKING: Stable/uncomplicated ?  ?EVALUATION COMPLEXITY: Low ?  ?  ?GOALS: ?Goals reviewed with patient? Yes ?  ?SHORT TERM GOALS: ?  ?Pt to be I with HEP.  ?Baseline:  ?Target date: 03/20/2022 ?Goal status: INITIAL ?  ?2.  Pt  to demonstrate at least 3/5 internal pelvic floor strength for improved pelvic stability and decreased leakage. ?Baseline:  ?Target date: 03/20/2022 ?Goal status: INITIAL ?  ?3.  Pt to report no more than 3/10 pain at Rt forearm for improved ability to complete piano lessens with less pain.  ?Baseline:  ?Target date: 03/20/2022 ?Goal status: INITIAL ?  ?4.  Pt to report no more than one instance of urinary leakage per week for improved QOL and decreased symptoms.  ?Baseline:  ?Target date: 03/20/2022 ?Goal status: INITIAL ?  ?  ?  ?LONG TERM GOALS: ?  ?  ?Pt to be I with advanced HEP.  ?Baseline:  ?Target date: 05/23/2022 ?Goal status: INITIAL ?  ?2.  Pt to demonstrate at least 4/5 internal pelvic floor strength for improved pelvic stability and decreased leakage. ?Baseline:  ?Target date: 05/23/2022 ?Goal status: INITIAL ?  ?3.  Pt to report no more than 1/10 pain at Rt forearm for improved ability to complete piano lessens with less pain.  ?Baseline:  ?Target date: 05/23/2022 ?Goal status: INITIAL ?  ?4.  Pt to report no more than one instance of urinary leakage per month for improved QOL and decreased symptoms.  ?Baseline:  ?Target date: 05/23/2022 ?Goal  status: INITIAL ?5.  Pt to report ability to wait at least 2.5 hours between bladder voids to decrease urinary urge and frequency to restroom for more functional times.  ?Baseline:  ?Target date: 05/23/2022 ?Goal stat

## 2022-03-03 ENCOUNTER — Encounter (HOSPITAL_BASED_OUTPATIENT_CLINIC_OR_DEPARTMENT_OTHER): Payer: PPO | Admitting: Dietician

## 2022-03-03 ENCOUNTER — Other Ambulatory Visit: Payer: Self-pay

## 2022-03-03 DIAGNOSIS — R7303 Prediabetes: Secondary | ICD-10-CM | POA: Diagnosis not present

## 2022-03-03 NOTE — Progress Notes (Signed)
On 02/26/2022 patient completed Core Session 4 of Diabetes Prevention Program course virtually with [facility]. The following learning objectives were met by the patient during this class:  ? ?Virtual Visit via Video Note ? ?I connected with Rodena Piety L. Trumpler-Rich, [birth year] on 03/03/22 at 10:30 AM EDT by a video enabled application and verified that I am speaking with the correct person using two identifiers. ? ?I discussed the limitations of evaluation and management by telemedicine and the availability of in person appointments. The patient expressed understanding and agreed to proceed. ? ?Location: ?Patient: Virtual ?Provider: Office ? ?Learning Objectives: ?Describe the MyPlate food guide and its recommendations, including how to reduce fat and calories in our diet. ?Compare and contrast MyPlate guidelines with participants? eating habits. ?List ways to replace high-fat and high-calorie foods with low-fat and low-calorie foods. ?Explain the importance of eating plenty of whole grains, vegetables, and fruits, while staying within fat gram goals. ?Explain the importance of eating foods from all groups of MyPlate and of eating a variety of foods from within each group. ?Explain why a balanced diet is beneficial to health. ? ?Goals:  ?Record weight taken outside of class.  ?Track foods and beverages eaten each day in the "Food and Activity Tracker," including calories and fat grams for each item.  ?Practice comparing what you eat with the recommendations of MyPlate using the "Rate Your Plate" handout.  ?Complete the "Rate Your Plate" handout form on at least 3 days.  ?Answer homework questions.  ? ?Follow-Up Plan: ?Attend Core Session 5 next week.  ?Email completed "Food and Activity Tracker" next week to be reviewed by [facility].  ? ?

## 2022-03-04 ENCOUNTER — Ambulatory Visit: Payer: PPO | Admitting: Physical Therapy

## 2022-03-04 ENCOUNTER — Other Ambulatory Visit: Payer: Self-pay

## 2022-03-04 DIAGNOSIS — M6281 Muscle weakness (generalized): Secondary | ICD-10-CM | POA: Diagnosis not present

## 2022-03-04 DIAGNOSIS — R279 Unspecified lack of coordination: Secondary | ICD-10-CM

## 2022-03-04 NOTE — Therapy (Signed)
?OUTPATIENT PHYSICAL THERAPY TREATMENT NOTE ? ? ?Patient Name: Kimberly Ryan ?MRN: [id number] ?DOB:08-Feb-1951, 71 y.o., female ?Today's Date: 03/04/2022 ? ?PCP: Leighton Ruff, MD (Inactive) ?REFERRING PROVIDER: Faustino Congress, NP ? ? PT End of Session - 03/04/22 1154   ? ? Visit Number 4   ? Date for PT Re-Evaluation 05/23/22   ? Authorization Type [employer]   ? Progress Note Due on Visit 20   ? PT Start Time 1153   pt arrival time  ? PT Stop Time 7673   ? PT Time Calculation (min) 36 min   ? Activity Tolerance Patient tolerated treatment well   ? Behavior During Therapy [facility] for tasks assessed/performed   ? ?  ?  ? ?  ? ? ? ?Past Medical History:  ?Diagnosis Date  ? Anxiety   ? Arthritis   ? GERD (gastroesophageal reflux disease)   ? Headache(784.0)   ? Shortness of breath   ? with exertion   ? Thyroid disease   ? ?Past Surgical History:  ?Procedure Laterality Date  ? MYOMECTOMY  2000  ? RHINOPLASTY  1953  ? THYROID LOBECTOMY  08/17/2012  ? Procedure: THYROID LOBECTOMY;  Surgeon: [name], MD,FACS;  Location: [facility];  Service: General;  Laterality: Left;  Left Thyroid Lobectomy  ? TONSILLECTOMY  1954  ? ?Patient Active Problem List  ? Diagnosis Date Noted  ? Postsurgical hypothyroidism 11/04/2012  ? Multiple thyroid nodules 12/19/2011  ? ? ?REFERRING DIAG: M79.631 (ICD-10-CM) - Pain in right forearm  05/23/2022 ?M77.9 (ICD-10-CM) - Enthesopathy, unspecified ?N39.41 (ICD-10-CM) - Urge incontinence ? ?THERAPY DIAG:  ?Muscle weakness (generalized) ? ?Unspecified lack of coordination ? ?PERTINENT HISTORY: nothing significant  ? ?PRECAUTIONS: none ? ?SUBJECTIVE: Pt does report improvement with forearm pain overall. Pt reports she has not had any leakage since last week.  ? ?PAIN:  ?PAIN:  ?Are you having pain? Yes ?NPRS scale: 4/10 only with mobility and use of it ?Pain location: Rt forearm ?Pain orientation: Right and Posterior  ?PAIN TYPE: aching ?Pain description: sharp and aching   ?Aggravating factors: gripping, lifting, fine and gross grip ?Relieving factors: ice  ? ? ? ? ? ?OBJECTIVE: FROM INITIAL EVALUATION 02/13/22 ?  ?COGNITION: ?           Overall cognitive status: Within functional limits for tasks assessed              ?            ?SENSATION: ?           Light touch: Appears intact ?           Proprioception: Appears intact ?  ?MUSCLE LENGTH: ?Bil hamstrings and adductors limited by 25% ?  ?  ?POSTURE:  ?Rounded shoulders, posterior pelvic tilt ?  ?PALPATION: ?  ?GENERAL TTP at Rt lateral forearm, (+) pain with resisted Rt wrist extension, (+) pain with passive ROM for pronation and wrist extension ?  ?ROM UE: LT UE all WFL ?           Rt UE: shoulder WFL; elbow WFL; wrist WFL however pain at end range in all directions and extension to neutral actively  ?  ?MMT UE: LT UE all WFL ?           Rt UE: shoulder WFL; elbow WFL however pain with flexion; wrist ext 3+/5; flexion 4/5; abduction and adduction 3+/5 ?  ?  ?  ?  ?PELVIC MMT: deferred until next session ?  ?  MMT   ?02/20/2022  ?Vaginal  3/5; 5s isometric; 3 reps   ?Internal Anal Sphincter    ?External Anal Sphincter    ?Puborectalis    ?Diastasis Recti    ?(Blank rows = not tested) ?  ?  ?TONE: ?Slightly decreased ?  ?PROLAPSE: ?None noted in hooklying at this time 03/04/2022  ?  ?  ?  ?TODAY'S TREATMENT  ? ?03/04/2022: Pelvic floor treatment for urge incontinence ? All exercises coordinated with pelvic floor mobility and breathing mechanics for improved technique and pelvic floor strengthening and coordination improvement for decreased leakage ? 2x10 bridge with ball squeeze ?Ipsilateral hand/knee press with ball 2x10 in HL ?Black band hip abduction 2x10 in HL ?Black band hip flexion 2x10 in HL ?Modified quad with high table bird dogs 2x10 with breathing mechanics and core activations ?Modified quad with high table with shoulder taps x10 ?X10 squats 5# kettle bell ?X10 mario punches ? ?02/26/22: (ortho appointment for Rt elbow  pain) ?STM Rt wrist extensors and tendons ?Wrist pronation, supination and wrist extensor stretch, Rt x 30" each ?Discussion about benefits of elbow tendonitis brace and reviewed options online ?Demo of self-massage options to wrist extensors ?Seated with forearm propped: A/ROM x 10 each: wrist circles each way, pron/sup, wrist ext with Lt UE assist into ext, Rt eccentric lowering ?Ice stick massage to Rt ECRB/L tendons x 2', explanation of ice cup massage option x 2' at home as needed for heightened pain days ?Ionto #1 to Rt common wrist extensor tendon with care instructions ? ?02/25/2022 Treatment: Pt consented to internal pelvic floor assessment this date vaginally. Pt directed in x5 pelvic floor contractions vaginally with VC for breathing and core activations and to decrease compensatory strategies. Pt also directed in 2x8s isometric holds with cues for breathing, and 1V49 quick flicks. Pt demonstrated decreased strength, endurance, and coordination with pelvic floor muscles and benefited from cues to improve technique. Pt also educated in proper pelvic floor mobility technique throughout session and updated HEP to include pelvic floor exercises for home. Pt directed in additional pelvic floor contractions x10; 3x8s isometric holds, 4W96 quick flicks all in sitting and on small towel roll for improved feedback of tissue mobility. Pt reported this helped and better understood how to complete at home. Pt also given and educated on bladder diary to attempt at home.  ? ?EVAL 02/20/2022 exam completed, exam findings reviewed, HEP given for mobility at Rt wrist to address initially to decrease pain. Urge drill and bladder irritants also given to address urge incontinence.  ? ? ?  ?  ?PATIENT EDUCATION:  ?Education details:  ?02/25/22: HEP, bladder diary, technique for pelvic floor mobility ?02/26/22: Discussion about benefits of elbow tendonitis brace and reviewed options online, self-massage to tendons and muscles of  forearm, ice cup massage x 2' as needed for more painful days ?03/04/2022: educated on continued HEP and coordination of PF and breathing with all exercises  ?Person educated: Patient ?Education method: Explanation, Demonstration, Tactile cues, Verbal cues, and Handouts ?Education comprehension: verbalized understanding and returned demonstration ?  ?  ?[place]: ?[id number] ?  ?ASSESSMENT: ?  ?CLINICAL IMPRESSION: ? Pt presents to clinic for pelvic floor session, report no leakage in the past week and declined need of internal feedback today. Pt session focused on core/hip strengthening with coordination of pelvic floor and breathing mechanics for improved mobility and strength at pelvic floor with less strain and leakage. Pt tolerated well and needed minimal cues for technique and coordination. Pt  would benefit from additional PT to further address deficits.   ?  ?  ?OBJECTIVE IMPAIRMENTS decreased activity tolerance, decreased coordination, decreased endurance, decreased mobility, decreased strength, increased fascial restrictions, impaired perceived functional ability, increased muscle spasms, impaired flexibility, improper body mechanics, postural dysfunction, prosthetic dependency , and pain.  ?  ?ACTIVITY LIMITATIONS community activity, driving, meal prep, occupation, laundry, yard work, and shopping.  ?  ?PERSONAL FACTORS Time since onset of injury/illness/exacerbation are also affecting patient's functional outcome.  ?  ?  ?REHAB POTENTIAL: Good ?  ?CLINICAL DECISION MAKING: Stable/uncomplicated ?  ?EVALUATION COMPLEXITY: Low ?  ?  ?GOALS: ?Goals reviewed with patient? Yes ?  ?SHORT TERM GOALS: ?  ?Pt to be I with HEP.  ?Baseline:  ?Target date: 03/20/2022 ?Goal status: INITIAL ?  ?2.  Pt to demonstrate at least 3/5 internal pelvic floor strength for improved pelvic stability and decreased leakage. ?Baseline:  ?Target date: 03/20/2022 ?Goal status: INITIAL ?  ?3.  Pt to report no more than 3/10 pain  at Rt forearm for improved ability to complete piano lessens with less pain.  ?Baseline:  ?Target date: 03/20/2022 ?Goal status: INITIAL ?  ?4.  Pt to report no more than one instance of urinary leakage per week for improved

## 2022-03-06 ENCOUNTER — Encounter: Payer: Self-pay | Admitting: Physical Therapy

## 2022-03-06 ENCOUNTER — Ambulatory Visit: Payer: PPO | Admitting: Physical Therapy

## 2022-03-06 ENCOUNTER — Other Ambulatory Visit: Payer: Self-pay

## 2022-03-06 DIAGNOSIS — M79631 Pain in right forearm: Secondary | ICD-10-CM

## 2022-03-06 DIAGNOSIS — M6281 Muscle weakness (generalized): Secondary | ICD-10-CM

## 2022-03-06 NOTE — Therapy (Signed)
?OUTPATIENT PHYSICAL THERAPY TREATMENT NOTE ? ? ?Patient Name: Kimberly Ryan ?MRN: [id number] ?DOB:[birth year], 71 y.o., female ?Today's Date: 03/06/2022 ? ?PCP: Leighton Ruff, MD (Inactive) ?REFERRING PROVIDER: No ref. provider found ? ? PT End of Session - 03/06/22 1150   ? ? Visit Number 5   ? Date for PT Re-Evaluation 05/23/22   ? Authorization Type [employer]   ? Progress Note Due on Visit 20   ? PT Start Time 1150   ? PT Stop Time 1228   ? PT Time Calculation (min) 38 min   ? Activity Tolerance Patient tolerated treatment well   ? Behavior During Therapy [facility] for tasks assessed/performed   ? ?  ?  ? ?  ? ? ? ?Past Medical History:  ?Diagnosis Date  ? Anxiety   ? Arthritis   ? GERD (gastroesophageal reflux disease)   ? Headache(784.0)   ? Shortness of breath   ? with exertion   ? Thyroid disease   ? ?Past Surgical History:  ?Procedure Laterality Date  ? MYOMECTOMY  2000  ? RHINOPLASTY  1953  ? THYROID LOBECTOMY  08/17/2012  ? Procedure: THYROID LOBECTOMY;  Surgeon: [name], MD,FACS;  Location: [facility];  Service: General;  Laterality: Left;  Left Thyroid Lobectomy  ? TONSILLECTOMY  1954  ? ?Patient Active Problem List  ? Diagnosis Date Noted  ? Postsurgical hypothyroidism 11/04/2012  ? Multiple thyroid nodules 12/19/2011  ? ? ?REFERRING DIAG: M79.631 (ICD-10-CM) - Pain in right forearm  05/23/2022 ? ? ?THERAPY DIAG:  ?Pain in right forearm ? ?Muscle weakness (generalized) ? ?PERTINENT HISTORY: nothing significant  ? ?PRECAUTIONS: none ? ?SUBJECTIVE: 35-40% improvement in the Rt forearm.  Someone loaned me an elbow brace and I used it when having to write a lot on Sat but it kept slipping down.   ? ?PAIN:  ?PAIN:  ?Are you having pain? Yes ?NPRS scale: 0-4/10 only with mobility and use of it ?Pain location: Rt forearm ?Pain orientation: Right and Posterior  ?PAIN TYPE: aching ?Pain description: sharp and aching  ?Aggravating factors: gripping, lifting, fine and gross grip ?Relieving factors: ice   ? ? ? ? ? ?OBJECTIVE: FROM INITIAL EVALUATION 02/13/22 ?  ?COGNITION: ?           Overall cognitive status: Within functional limits for tasks assessed              ?            ?SENSATION: ?           Light touch: Appears intact ?           Proprioception: Appears intact ?  ?MUSCLE LENGTH: ?Bil hamstrings and adductors limited by 25% ?  ?  ?POSTURE:  ?Rounded shoulders, posterior pelvic tilt ?  ?PALPATION: ?  ?GENERAL TTP at Rt lateral forearm, (+) pain with resisted Rt wrist extension, (+) pain with passive ROM for pronation and wrist extension ?  ?ROM UE: LT UE all WFL ?           Rt UE: shoulder WFL; elbow WFL; wrist WFL however pain at end range in all directions and extension to neutral actively  ?  ?MMT UE: LT UE all WFL ?           Rt UE: shoulder WFL; elbow WFL however pain with flexion; wrist ext 3+/5; flexion 4/5; abduction and adduction 3+/5 ?  ?  ?  ?  ?PELVIC MMT: deferred until next session ?  ?  MMT   ?02/20/2022  ?Vaginal  3/5; 5s isometric; 3 reps   ?Internal Anal Sphincter    ?External Anal Sphincter    ?Puborectalis    ?Diastasis Recti    ?(Blank rows = not tested) ?  ?  ?TONE: ?Slightly decreased ?  ?PROLAPSE: ?None noted in hooklying at this time 03/06/2022  ?  ?  ?  ?TODAY'S TREATMENT  ?03/06/22: ?STM Rt wrist extensors and tendons, cross friction massage common extensor tendon ?Ice stick massage x 3' common extensor tendon ?Key grip and whole hand grip, gentle x 15 reps each using small squeeze ball ?1lb Rt pronation/supination x 15 reps ?1lb Rt wrist flexion/extension x 15lb thumb up forearm resting on mat table ?Ionto #2 to Rt common wrist extensor tendon with care instructions ? ?03/06/2022: Pelvic floor treatment for urge incontinence ? All exercises coordinated with pelvic floor mobility and breathing mechanics for improved technique and pelvic floor strengthening and coordination improvement for decreased leakage ? 2x10 bridge with ball squeeze ?Ipsilateral hand/knee press with ball 2x10 in  HL ?Black band hip abduction 2x10 in HL ?Black band hip flexion 2x10 in HL ?Modified quad with high table bird dogs 2x10 with breathing mechanics and core activations ?Modified quad with high table with shoulder taps x10 ?X10 squats 5# kettle bell ?X10 mario punches ? ?02/26/22: (ortho appointment for Rt elbow pain) ?STM Rt wrist extensors and tendons ?Wrist pronation, supination and wrist extensor stretch, Rt x 30" each ?Discussion about benefits of elbow tendonitis brace and reviewed options online ?Demo of self-massage options to wrist extensors ?Seated with forearm propped: A/ROM x 10 each: wrist circles each way, pron/sup, wrist ext with Lt UE assist into ext, Rt eccentric lowering ?Ice stick massage to Rt ECRB/L tendons x 2', explanation of ice cup massage option x 2' at home as needed for heightened pain days ?Ionto #1 to Rt common wrist extensor tendon with care instructions ? ?02/25/2022 Treatment: Pt consented to internal pelvic floor assessment this date vaginally. Pt directed in x5 pelvic floor contractions vaginally with VC for breathing and core activations and to decrease compensatory strategies. Pt also directed in 2x8s isometric holds with cues for breathing, and 7E72 quick flicks. Pt demonstrated decreased strength, endurance, and coordination with pelvic floor muscles and benefited from cues to improve technique. Pt also educated in proper pelvic floor mobility technique throughout session and updated HEP to include pelvic floor exercises for home. Pt directed in additional pelvic floor contractions x10; 3x8s isometric holds, 0N47 quick flicks all in sitting and on small towel roll for improved feedback of tissue mobility. Pt reported this helped and better understood how to complete at home. Pt also given and educated on bladder diary to attempt at home.  ? ?EVAL 02/20/2022 exam completed, exam findings reviewed, HEP given for mobility at Rt wrist to address initially to decrease pain. Urge drill and  bladder irritants also given to address urge incontinence.  ? ? ?  ?  ?PATIENT EDUCATION:  ?Education details:  ?02/25/22: HEP, bladder diary, technique for pelvic floor mobility ?02/26/22: Discussion about benefits of elbow tendonitis brace and reviewed options online, self-massage to tendons and muscles of forearm, ice cup massage x 2' as needed for more painful days ?03/06/2022: educated on continued HEP and coordination of PF and breathing with all exercises  ?Person educated: Patient ?Education method: Explanation, Demonstration, Tactile cues, Verbal cues, and Handouts ?Education comprehension: verbalized understanding and returned demonstration ?  ?  ?[place]: ?[id number] ?  ?ASSESSMENT: ?  ?  CLINICAL IMPRESSION: ? Pt reports 35-40% improvement in Rt elbow pain.  She continues to have tenderness along common wrist extensor tendon.  PT continued STM, ice massage and ionto #2 today given good response last time.  Carefully introduced light key grip, whole grip, sup/pron with 1lb and flex/ext with 1lb with good tolerance.  Pt will look into getting well fitting elbow brace as the one loaned to her did not fit well.   ?  ?  ?OBJECTIVE IMPAIRMENTS decreased activity tolerance, decreased coordination, decreased endurance, decreased mobility, decreased strength, increased fascial restrictions, impaired perceived functional ability, increased muscle spasms, impaired flexibility, improper body mechanics, postural dysfunction, prosthetic dependency , and pain.  ?  ?ACTIVITY LIMITATIONS community activity, driving, meal prep, occupation, laundry, yard work, and shopping.  ?  ?PERSONAL FACTORS Time since onset of injury/illness/exacerbation are also affecting patient's functional outcome.  ?  ?  ?REHAB POTENTIAL: Good ?  ?CLINICAL DECISION MAKING: Stable/uncomplicated ?  ?EVALUATION COMPLEXITY: Low ?  ?  ?GOALS: ?Goals reviewed with patient? Yes ?  ?SHORT TERM GOALS: ?  ?Pt to be I with HEP.  ?Baseline:  ?Target  date: 03/20/2022 ?Goal status: INITIAL ?  ?2.  Pt to demonstrate at least 3/5 internal pelvic floor strength for improved pelvic stability and decreased leakage. ?Baseline:  ?Target date: 03/20/2022 ?Goal s

## 2022-03-10 ENCOUNTER — Other Ambulatory Visit: Payer: Self-pay

## 2022-03-10 ENCOUNTER — Encounter (HOSPITAL_BASED_OUTPATIENT_CLINIC_OR_DEPARTMENT_OTHER): Payer: PPO | Admitting: Dietician

## 2022-03-10 DIAGNOSIS — R7303 Prediabetes: Secondary | ICD-10-CM | POA: Diagnosis not present

## 2022-03-10 NOTE — Progress Notes (Signed)
On 03/10/2022 patient completed Core Session 5 of Diabetes Prevention Program course virtually with [facility]. The following learning objectives were met by the patient during this class:  ? ?Virtual Visit via Video Note ? ?I connected with Kimberly Ryan, [birth year] on 03/10/22 at 10:30 AM EDT by a video enabled application and verified that I am speaking with the correct person using two identifiers. ?  ?I discussed the limitations of evaluation and management by telemedicine and the availability of in person appointments. The patient expressed understanding and agreed to proceed. ? ?Location: ?Patient: Virtual ?Provider: Office ? ?Learning Objectives: ?Establish a physical activity goal. ?Explain the importance of the physical activity goal. ?Describe their current level of physical activity. ?Name ways that they are already physically active. ?Develop personal plans for physical activity for the next week.  ? ?Goals:  ?Record weight taken outside of class.  ?Track foods and beverages eaten each day in the "Food and Activity Tracker," including calories and fat grams for each item.  ?Make an Activity Plan including date, specific type of activity, and length of time you plan to be active that includes at last 60 minutes of activity for the week.  ?Track activity type, minutes you were active, and distance you reached each day in the "Food and Activity Tracker."  ? ?Follow-Up Plan: ?Attend Core Session 6 next week.  ?E-mail completed "Food and Activity Tracker" to [facility] next week before class ? ?

## 2022-03-12 ENCOUNTER — Encounter: Payer: PPO | Admitting: Physical Therapy

## 2022-03-13 ENCOUNTER — Ambulatory Visit: Payer: PPO | Admitting: Physical Therapy

## 2022-03-13 ENCOUNTER — Encounter: Payer: Self-pay | Admitting: Physical Therapy

## 2022-03-13 DIAGNOSIS — M6281 Muscle weakness (generalized): Secondary | ICD-10-CM

## 2022-03-13 DIAGNOSIS — M79631 Pain in right forearm: Secondary | ICD-10-CM

## 2022-03-13 DIAGNOSIS — R279 Unspecified lack of coordination: Secondary | ICD-10-CM

## 2022-03-13 NOTE — Therapy (Signed)
?OUTPATIENT PHYSICAL THERAPY TREATMENT NOTE ? ? ?Patient Name: Kimberly Ryan ?MRN: [id number] ?DOB:1951-07-09, 71 y.o., female ?Today's Date: 03/13/2022 ? ?PCP: Leighton Ruff, MD (Inactive) ?REFERRING PROVIDER: Faustino Congress, NP ? ? PT End of Session - 03/13/22 1316   ? ? Visit Number 7   ? Date for PT Re-Evaluation 05/23/22   ? Authorization Type [employer]   ? Progress Note Due on Visit 20   ? PT Start Time 1230   ? PT Stop Time 1314   ? PT Time Calculation (min) 44 min   ? Activity Tolerance Patient tolerated treatment well   ? Behavior During Therapy [facility] for tasks assessed/performed   ? ?  ?  ? ?  ? ? ? ? ?Past Medical History:  ?Diagnosis Date  ? Anxiety   ? Arthritis   ? GERD (gastroesophageal reflux disease)   ? Headache(784.0)   ? Shortness of breath   ? with exertion   ? Thyroid disease   ? ?Past Surgical History:  ?Procedure Laterality Date  ? MYOMECTOMY  2000  ? RHINOPLASTY  1953  ? THYROID LOBECTOMY  08/17/2012  ? Procedure: THYROID LOBECTOMY;  Surgeon: [name], MD,FACS;  Location: [facility];  Service: General;  Laterality: Left;  Left Thyroid Lobectomy  ? TONSILLECTOMY  1954  ? ?Patient Active Problem List  ? Diagnosis Date Noted  ? Postsurgical hypothyroidism 11/04/2012  ? Multiple thyroid nodules 12/19/2011  ? ? ?REFERRING DIAG: M79.631 (ICD-10-CM) - Pain in right forearm  05/23/2022 ? ? ?THERAPY DIAG:  ?Muscle weakness (generalized) ? ?Unspecified lack of coordination ? ?PERTINENT HISTORY: nothing significant  ? ?PRECAUTIONS: none ? ?SUBJECTIVE: 40-45% improvement in the Rt forearm.  Can still have some spikes of pain.  I got a brace but forgot to bring it.  I am not sure it fits correctly. ? ?PAIN:  ?PAIN:  ?Are you having pain? Yes ?NPRS scale: 0-4/10 only with mobility and use of it ?Pain location: Rt forearm ?Pain orientation: Right and Posterior  ?PAIN TYPE: aching ?Pain description: sharp and aching  ?Aggravating factors: gripping, lifting, fine and gross grip ?Relieving  factors: ice  ? ? ? ? ? ?OBJECTIVE: FROM INITIAL EVALUATION 02/13/22 ?  ?COGNITION: ?           Overall cognitive status: Within functional limits for tasks assessed              ?            ?SENSATION: ?           Light touch: Appears intact ?           Proprioception: Appears intact ?  ?MUSCLE LENGTH: ?Bil hamstrings and adductors limited by 25% ?  ?  ?POSTURE:  ?Rounded shoulders, posterior pelvic tilt ?  ?PALPATION: ?  ?GENERAL TTP at Rt lateral forearm, (+) pain with resisted Rt wrist extension, (+) pain with passive ROM for pronation and wrist extension ?  ?ROM UE: LT UE all WFL ?           Rt UE: shoulder WFL; elbow WFL; wrist WFL however pain at end range in all directions and extension to neutral actively  ?  ?MMT UE: LT UE all WFL ?           Rt UE: shoulder WFL; elbow WFL however pain with flexion; wrist ext 3+/5; flexion 4/5; abduction and adduction 3+/5 ?  ?  ?  ?  ?PELVIC MMT: deferred until next session ?  ?  MMT   ?02/20/2022  ?Vaginal  3/5; 5s isometric; 3 reps   ?Internal Anal Sphincter    ?External Anal Sphincter    ?Puborectalis    ?Diastasis Recti    ?(Blank rows = not tested) ?  ?  ?TONE: ?Slightly decreased ?  ?PROLAPSE: ?None noted in hooklying at eval time  ?  ?  ?  ?TODAY'S TREATMENT  ?03/13/2022 : Pelvic floor treatment: ?All exercises coordinated with pelvic floor mobility and breathing mechanics for improved technique and pelvic floor strengthening and coordination improvement for decreased leakage ?2x10 hip flexion in seated position 4# ankle weights with TA activation ?2x10 TA activation with ball squeeze  in hands in sitting  ?2x10 90/90 hooklying heel taps  ?2x10 ball squeeze bridge ?2x10 yellow band palloffs ? 2x10 yellow band rotational palloffs ?X10 squats ? ? ? ?03/13/22 (ortho appt for Rt elbow pain) ?STM Rt wrist extensors and tendons ?Standing Rt elbow flexion 2lb x 10 ?Standing pron/sup 1lb Rt arm at 30 deg flexion straight elbow (simulate pouring) ?Seated 1lb wrist circles 5x each  way ?Seated 1lb wrist flexion and extension 1lb x 5 each ?Velcro board: pron/sup long handle x 3, flex/ext long handle x 3 ?Ice pack to wrist extensors x 5' end of session ? ?03/06/22: ?STM Rt wrist extensors and tendons, cross friction massage common extensor tendon ?Ice stick massage x 3' common extensor tendon ?Key grip and whole hand grip, gentle x 15 reps each using small squeeze ball ?1lb Rt pronation/supination x 15 reps ?1lb Rt wrist flexion/extension x 15lb thumb up forearm resting on mat table ?Ionto #2 to Rt common wrist extensor tendon with care instructions ? ?03/04/2022: Pelvic floor treatment for urge incontinence ? All exercises coordinated with pelvic floor mobility and breathing mechanics for improved technique and pelvic floor strengthening and coordination improvement for decreased leakage ? 2x10 bridge with ball squeeze ?Ipsilateral hand/knee press with ball 2x10 in HL ?Black band hip abduction 2x10 in HL ?Black band hip flexion 2x10 in HL ?Modified quad with high table bird dogs 2x10 with breathing mechanics and core activations ?Modified quad with high table with shoulder taps x10 ?X10 squats 5# kettle bell ?X10 mario punches ? ?02/26/22: (ortho appointment for Rt elbow pain) ?STM Rt wrist extensors and tendons ?Wrist pronation, supination and wrist extensor stretch, Rt x 30" each ?Discussion about benefits of elbow tendonitis brace and reviewed options online ?Demo of self-massage options to wrist extensors ?Seated with forearm propped: A/ROM x 10 each: wrist circles each way, pron/sup, wrist ext with Lt UE assist into ext, Rt eccentric lowering ?Ice stick massage to Rt ECRB/L tendons x 2', explanation of ice cup massage option x 2' at home as needed for heightened pain days ?Ionto #1 to Rt common wrist extensor tendon with care instructions ? ?02/25/2022 Treatment: Pt consented to internal pelvic floor assessment this date vaginally. Pt directed in x5 pelvic floor contractions vaginally with VC  for breathing and core activations and to decrease compensatory strategies. Pt also directed in 2x8s isometric holds with cues for breathing, and 1D62 quick flicks. Pt demonstrated decreased strength, endurance, and coordination with pelvic floor muscles and benefited from cues to improve technique. Pt also educated in proper pelvic floor mobility technique throughout session and updated HEP to include pelvic floor exercises for home. Pt directed in additional pelvic floor contractions x10; 3x8s isometric holds, 2W97 quick flicks all in sitting and on small towel roll for improved feedback of tissue mobility. Pt reported this helped and better  understood how to complete at home. Pt also given and educated on bladder diary to attempt at home.  ? ?EVAL 02/20/2022 exam completed, exam findings reviewed, HEP given for mobility at Rt wrist to address initially to decrease pain. Urge drill and bladder irritants also given to address urge incontinence.  ? ? ?  ?  ?PATIENT EDUCATION:  ?Education details:  ?02/25/22: HEP, bladder diary, technique for pelvic floor mobility ?02/26/22: Discussion about benefits of elbow tendonitis brace and reviewed options online, self-massage to tendons and muscles of forearm, ice cup massage x 2' as needed for more painful days ?03/13/2022: educated on continued HEP and coordination of PF and breathing with all exercises  ?Person educated: Patient ?Education method: Explanation, Demonstration, Tactile cues, Verbal cues, and Handouts ?Education comprehension: verbalized understanding and returned demonstration ?  ?  ?[place]: ?[id number] ?  ?ASSESSMENT: ?  ?CLINICAL IMPRESSION: ? Pt reports 40-45% improvement in Rt forearm pain, located more toward wrist than elbow.  Tenderness along common wrist extensor tendon is dramatically reduced.  PT continued STM and progressed therex/HEPl  Pt purchased wrist brace but forgot it. PT encouraged her to use it when playing piano, writing or doing  other fine motor tasks.  Continue along POC.    ?  ?  ?OBJECTIVE IMPAIRMENTS decreased activity tolerance, decreased coordination, decreased endurance, decreased mobility, decreased strength, increas

## 2022-03-13 NOTE — Patient Instructions (Signed)

## 2022-03-13 NOTE — Therapy (Signed)
?OUTPATIENT PHYSICAL THERAPY TREATMENT NOTE ? ? ?Patient Name: Kimberly Ryan ?MRN: [id number] ?DOB:03-21-1951, 71 y.o., female ?Today's Date: 03/13/2022 ? ?PCP: Leighton Ruff, MD (Inactive) ?REFERRING PROVIDER: No ref. provider found ? ? PT End of Session - 03/13/22 1150   ? ? Visit Number 6   ? Date for PT Re-Evaluation 05/23/22   ? Authorization Type [employer]   ? Progress Note Due on Visit 20   ? PT Start Time 1150   ? PT Stop Time 1228   ? PT Time Calculation (min) 38 min   ? Activity Tolerance Patient tolerated treatment well   ? Behavior During Therapy [facility] for tasks assessed/performed   ? ?  ?  ? ?  ? ? ? ?Past Medical History:  ?Diagnosis Date  ? Anxiety   ? Arthritis   ? GERD (gastroesophageal reflux disease)   ? Headache(784.0)   ? Shortness of breath   ? with exertion   ? Thyroid disease   ? ?Past Surgical History:  ?Procedure Laterality Date  ? MYOMECTOMY  2000  ? RHINOPLASTY  1953  ? THYROID LOBECTOMY  08/17/2012  ? Procedure: THYROID LOBECTOMY;  Surgeon: [name], MD,FACS;  Location: [facility];  Service: General;  Laterality: Left;  Left Thyroid Lobectomy  ? TONSILLECTOMY  1954  ? ?Patient Active Problem List  ? Diagnosis Date Noted  ? Postsurgical hypothyroidism 11/04/2012  ? Multiple thyroid nodules 12/19/2011  ? ? ?REFERRING DIAG: M79.631 (ICD-10-CM) - Pain in right forearm  05/23/2022 ? ? ?THERAPY DIAG:  ?Pain in right forearm ? ?Muscle weakness (generalized) ? ?PERTINENT HISTORY: nothing significant  ? ?PRECAUTIONS: none ? ?SUBJECTIVE: 40-45% improvement in the Rt forearm.  Can still have some spikes of pain.  I got a brace but forgot to bring it.  I am not sure it fits correctly. ? ?PAIN:  ?PAIN:  ?Are you having pain? Yes ?NPRS scale: 0-4/10 only with mobility and use of it ?Pain location: Rt forearm ?Pain orientation: Right and Posterior  ?PAIN TYPE: aching ?Pain description: sharp and aching  ?Aggravating factors: gripping, lifting, fine and gross grip ?Relieving factors: ice   ? ? ? ? ? ?OBJECTIVE: FROM INITIAL EVALUATION 02/13/22 ?  ?COGNITION: ?           Overall cognitive status: Within functional limits for tasks assessed              ?            ?SENSATION: ?           Light touch: Appears intact ?           Proprioception: Appears intact ?  ?MUSCLE LENGTH: ?Bil hamstrings and adductors limited by 25% ?  ?  ?POSTURE:  ?Rounded shoulders, posterior pelvic tilt ?  ?PALPATION: ?  ?GENERAL TTP at Rt lateral forearm, (+) pain with resisted Rt wrist extension, (+) pain with passive ROM for pronation and wrist extension ?  ?ROM UE: LT UE all WFL ?           Rt UE: shoulder WFL; elbow WFL; wrist WFL however pain at end range in all directions and extension to neutral actively  ?  ?MMT UE: LT UE all WFL ?           Rt UE: shoulder WFL; elbow WFL however pain with flexion; wrist ext 3+/5; flexion 4/5; abduction and adduction 3+/5 ?  ?  ?  ?  ?PELVIC MMT: deferred until next session ?  ?  MMT   ?02/20/2022  ?Vaginal  3/5; 5s isometric; 3 reps   ?Internal Anal Sphincter    ?External Anal Sphincter    ?Puborectalis    ?Diastasis Recti    ?(Blank rows = not tested) ?  ?  ?TONE: ?Slightly decreased ?  ?PROLAPSE: ?None noted in hooklying at this time 03/13/2022  ?  ?  ?  ?TODAY'S TREATMENT  ?03/13/22 (ortho appt for Rt elbow pain) ?STM Rt wrist extensors and tendons ?Standing Rt elbow flexion 2lb x 10 ?Standing pron/sup 1lb Rt arm at 30 deg flexion straight elbow (simulate pouring) ?Seated 1lb wrist circles 5x each way ?Seated 1lb wrist flexion and extension 1lb x 5 each ?Velcro board: pron/sup long handle x 3, flex/ext long handle x 3 ?Ice pack to wrist extensors x 5' end of session ? ?03/06/22: ?STM Rt wrist extensors and tendons, cross friction massage common extensor tendon ?Ice stick massage x 3' common extensor tendon ?Key grip and whole hand grip, gentle x 15 reps each using small squeeze ball ?1lb Rt pronation/supination x 15 reps ?1lb Rt wrist flexion/extension x 15lb thumb up forearm resting on  mat table ?Ionto #2 to Rt common wrist extensor tendon with care instructions ? ?03/13/2022: Pelvic floor treatment for urge incontinence ? All exercises coordinated with pelvic floor mobility and breathing mechanics for improved technique and pelvic floor strengthening and coordination improvement for decreased leakage ? 2x10 bridge with ball squeeze ?Ipsilateral hand/knee press with ball 2x10 in HL ?Black band hip abduction 2x10 in HL ?Black band hip flexion 2x10 in HL ?Modified quad with high table bird dogs 2x10 with breathing mechanics and core activations ?Modified quad with high table with shoulder taps x10 ?X10 squats 5# kettle bell ?X10 mario punches ? ?02/26/22: (ortho appointment for Rt elbow pain) ?STM Rt wrist extensors and tendons ?Wrist pronation, supination and wrist extensor stretch, Rt x 30" each ?Discussion about benefits of elbow tendonitis brace and reviewed options online ?Demo of self-massage options to wrist extensors ?Seated with forearm propped: A/ROM x 10 each: wrist circles each way, pron/sup, wrist ext with Lt UE assist into ext, Rt eccentric lowering ?Ice stick massage to Rt ECRB/L tendons x 2', explanation of ice cup massage option x 2' at home as needed for heightened pain days ?Ionto #1 to Rt common wrist extensor tendon with care instructions ? ?02/25/2022 Treatment: Pt consented to internal pelvic floor assessment this date vaginally. Pt directed in x5 pelvic floor contractions vaginally with VC for breathing and core activations and to decrease compensatory strategies. Pt also directed in 2x8s isometric holds with cues for breathing, and 7E93 quick flicks. Pt demonstrated decreased strength, endurance, and coordination with pelvic floor muscles and benefited from cues to improve technique. Pt also educated in proper pelvic floor mobility technique throughout session and updated HEP to include pelvic floor exercises for home. Pt directed in additional pelvic floor contractions x10;  3x8s isometric holds, 8B01 quick flicks all in sitting and on small towel roll for improved feedback of tissue mobility. Pt reported this helped and better understood how to complete at home. Pt also given and educated on bladder diary to attempt at home.  ? ?EVAL 02/20/2022 exam completed, exam findings reviewed, HEP given for mobility at Rt wrist to address initially to decrease pain. Urge drill and bladder irritants also given to address urge incontinence.  ? ? ?  ?  ?PATIENT EDUCATION:  ?Education details:  ?02/25/22: HEP, bladder diary, technique for pelvic floor mobility ?02/26/22: Discussion about  benefits of elbow tendonitis brace and reviewed options online, self-massage to tendons and muscles of forearm, ice cup massage x 2' as needed for more painful days ?03/13/2022: educated on continued HEP and coordination of PF and breathing with all exercises  ?Person educated: Patient ?Education method: Explanation, Demonstration, Tactile cues, Verbal cues, and Handouts ?Education comprehension: verbalized understanding and returned demonstration ?  ?  ?Stephenson: ?[id number] ?  ?ASSESSMENT: ?  ?CLINICAL IMPRESSION: ? Pt reports 40-45% improvement in Rt forearm pain, located more toward wrist than elbow.  Tenderness along common wrist extensor tendon is dramatically reduced.  PT continued STM and progressed therex/HEPl  Pt purchased wrist brace but forgot it. PT encouraged her to use it when playing piano, writing or doing other fine motor tasks.  Continue along POC.    ?  ?  ?OBJECTIVE IMPAIRMENTS decreased activity tolerance, decreased coordination, decreased endurance, decreased mobility, decreased strength, increased fascial restrictions, impaired perceived functional ability, increased muscle spasms, impaired flexibility, improper body mechanics, postural dysfunction, prosthetic dependency , and pain.  ?  ?ACTIVITY LIMITATIONS community activity, driving, meal prep, occupation, laundry, yard work, and  shopping.  ?  ?PERSONAL FACTORS Time since onset of injury/illness/exacerbation are also affecting patient's functional outcome.  ?  ?  ?REHAB POTENTIAL: Good ?  ?CLINICAL DECISION MAKING: Stable/uncomplica

## 2022-03-17 ENCOUNTER — Encounter: Payer: PPO | Attending: Family Medicine | Admitting: Dietician

## 2022-03-17 DIAGNOSIS — R7303 Prediabetes: Secondary | ICD-10-CM | POA: Diagnosis not present

## 2022-03-17 NOTE — Progress Notes (Signed)
On 03/17/2022 patient completed Core Session 6 of Diabetes Prevention Program course virtually with [employer]. The following learning objectives were met by the patient during this class:  ? ?Virtual Visit via Video Note ? ?I connected with Kimberly Ryan, 06/08/1951 on 03/17/22 at 10:30 AM EDT by a video enabled application and verified that I am speaking with the correct person using two identifiers. ?  ?I discussed the limitations of evaluation and management by telemedicine and the availability of in person appointments. The patient expressed understanding and agreed to proceed. ? ? ?Location: ?Patient: Virtual ?Provider: Office ? ?Learning Objectives: ?Graph their daily physical activity.  ?Describe two ways of finding the time to be active.  ?Define ?lifestyle activity.?  ?Describe how to prevent injury.  ?Develop an activity plan for the coming week.  ? ?Goals:  ?Record weight taken outside of class.  ?Track foods and beverages eaten each day in the "Food and Activity Tracker," including calories and fat grams for each item.   ?Track activity type, minutes you were active, and distance you reached each day in the "Food and Activity Tracker."  ?Set aside one 20 to 30-minute block of time every day or find two or more periods of 10 to15 minutes each for physical activity.  ?Warm up, cool down, and stretch. ?Make a Physical Activities Plan for the Week.  ? ?Follow-Up Plan: ?Attend Core Session 7 next week.  ?E-mail completed "Food and Activity Tracker" to Lifestyle Coach next week before class ? ?

## 2022-03-18 DIAGNOSIS — E559 Vitamin D deficiency, unspecified: Secondary | ICD-10-CM | POA: Diagnosis not present

## 2022-03-18 DIAGNOSIS — Z130 Encounter for screening for diseases of the blood and blood-forming organs and certain disorders involving the immune mechanism: Secondary | ICD-10-CM | POA: Diagnosis not present

## 2022-03-19 ENCOUNTER — Encounter: Payer: Self-pay | Admitting: Physical Therapy

## 2022-03-19 ENCOUNTER — Ambulatory Visit: Payer: PPO | Attending: Family Medicine | Admitting: Physical Therapy

## 2022-03-19 DIAGNOSIS — R293 Abnormal posture: Secondary | ICD-10-CM | POA: Diagnosis not present

## 2022-03-19 DIAGNOSIS — M6281 Muscle weakness (generalized): Secondary | ICD-10-CM | POA: Insufficient documentation

## 2022-03-19 DIAGNOSIS — R279 Unspecified lack of coordination: Secondary | ICD-10-CM | POA: Insufficient documentation

## 2022-03-19 DIAGNOSIS — M79631 Pain in right forearm: Secondary | ICD-10-CM | POA: Diagnosis not present

## 2022-03-19 NOTE — Therapy (Signed)
?OUTPATIENT PHYSICAL THERAPY TREATMENT NOTE ? ? ?Patient Name: Kimberly Ryan ?MRN: [id number] ?DOB:04-19-1951, 71 y.o., female ?Today's Date: 03/19/2022 ? ?PCP: Leighton Ruff, MD (Inactive) ?REFERRING PROVIDER: Faustino Congress, NP ? ? PT End of Session - 03/19/22 1151   ? ? Visit Number 8   ? Date for PT Re-Evaluation 05/23/22   ? Authorization Type [employer]   ? Progress Note Due on Visit 20   ? PT Start Time 1151   ? PT Stop Time 1238   ? PT Time Calculation (min) 47 min   ? Activity Tolerance Patient tolerated treatment well   ? Behavior During Therapy [facility] for tasks assessed/performed   ? ?  ?  ? ?  ? ? ? ? ?Past Medical History:  ?Diagnosis Date  ? Anxiety   ? Arthritis   ? GERD (gastroesophageal reflux disease)   ? Headache(784.0)   ? Shortness of breath   ? with exertion   ? Thyroid disease   ? ?Past Surgical History:  ?Procedure Laterality Date  ? MYOMECTOMY  2000  ? RHINOPLASTY  1953  ? THYROID LOBECTOMY  08/17/2012  ? Procedure: THYROID LOBECTOMY;  Surgeon: [name], MD,FACS;  Location: [facility];  Service: General;  Laterality: Left;  Left Thyroid Lobectomy  ? TONSILLECTOMY  1954  ? ?Patient Active Problem List  ? Diagnosis Date Noted  ? Postsurgical hypothyroidism 11/04/2012  ? Multiple thyroid nodules 12/19/2011  ? ? ?REFERRING DIAG: M79.631 (ICD-10-CM) - Pain in right forearm  05/23/2022 ? ? ?THERAPY DIAG:  ?Pain in right forearm ? ?Muscle weakness (generalized) ? ?Abnormal posture ? ?PERTINENT HISTORY: nothing significant  ? ?PRECAUTIONS: none ? ?SUBJECTIVE: My forearm feels better.  I like the exercises from last time.  I brought my brace today.  ? ?PAIN:  ?PAIN:  ?Are you having pain? Yes ?NPRS scale: 0-3/10 only with mobility and use of it ?Pain location: Rt forearm ?Pain orientation: Right and Posterior  ?PAIN TYPE: aching ?Pain description: sharp and aching  ?Aggravating factors: gripping, lifting, fine and gross grip ?Relieving factors: ice  ? ? ? ? ? ?OBJECTIVE: FROM INITIAL  EVALUATION 02/13/22 ?  ?COGNITION: ?           Overall cognitive status: Within functional limits for tasks assessed              ?            ?SENSATION: ?           Light touch: Appears intact ?           Proprioception: Appears intact ?  ?MUSCLE LENGTH: ?Bil hamstrings and adductors limited by 25% ?  ?  ?POSTURE:  ?Rounded shoulders, posterior pelvic tilt ?  ?PALPATION: ?  ?GENERAL TTP at Rt lateral forearm, (+) pain with resisted Rt wrist extension, (+) pain with passive ROM for pronation and wrist extension ?  ?ROM UE: LT UE all WFL ?           Rt UE: shoulder WFL; elbow WFL; wrist WFL however pain at end range in all directions and extension to neutral actively  ?  ?MMT UE: LT UE all WFL ?           Rt UE: shoulder WFL; elbow WFL however pain with flexion; wrist ext 3+/5; flexion 4/5; abduction and adduction 3+/5 ?  ?  ?  ?  ?PELVIC MMT: deferred until next session ?  ?MMT   ?02/20/2022  ?Vaginal  3/5; 5s  isometric; 3 reps   ?Internal Anal Sphincter    ?External Anal Sphincter    ?Puborectalis    ?Diastasis Recti    ?(Blank rows = not tested) ?  ?  ?TONE: ?Slightly decreased ?  ?PROLAPSE: ?None noted in hooklying at eval time  ?  ?  ?  ?TODAY'S TREATMENT  ?03/19/22: ortho treatment for Rt forearm: ?UBE L1 1x1 fwd/bwd PT present to monitor ?Velcro board pron/sup long handle x 3, flex/ext long handle x 3, wrist flexion/ext x 2 reps ?Standing row and shoulder ext red tband x 10 each ?2-way raises bil Ues 2lb fwd/diag (abd hurt elbow) ?Rt pron/sup at 90 deg x 10 holding 1lb (tried 2lb but pain, put brace on) ?Supine 1lb at 90 deg wrist alphabet ?Manual therapy: STM to wrist extensors ?Ice pack to posterior forearm end of session x 8' ? ?03/13/2022 : Pelvic floor treatment: ?All exercises coordinated with pelvic floor mobility and breathing mechanics for improved technique and pelvic floor strengthening and coordination improvement for decreased leakage ?2x10 hip flexion in seated position 4# ankle weights with TA  activation ?2x10 TA activation with ball squeeze  in hands in sitting  ?2x10 90/90 hooklying heel taps  ?2x10 ball squeeze bridge ?2x10 yellow band palloffs ? 2x10 yellow band rotational palloffs ?X10 squats ? ? ? ?03/13/22 (ortho appt for Rt elbow pain) ?STM Rt wrist extensors and tendons ?Standing Rt elbow flexion 2lb x 10 ?Standing pron/sup 1lb Rt arm at 30 deg flexion straight elbow (simulate pouring) ?Seated 1lb wrist circles 5x each way ?Seated 1lb wrist flexion and extension 1lb x 5 each ?Velcro board: pron/sup long handle x 3, flex/ext long handle x 3 ?Ice pack to wrist extensors x 5' end of session ? ?03/06/22: ?STM Rt wrist extensors and tendons, cross friction massage common extensor tendon ?Ice stick massage x 3' common extensor tendon ?Key grip and whole hand grip, gentle x 15 reps each using small squeeze ball ?1lb Rt pronation/supination x 15 reps ?1lb Rt wrist flexion/extension x 15lb thumb up forearm resting on mat table ?Ionto #2 to Rt common wrist extensor tendon with care instructions ? ?03/04/2022: Pelvic floor treatment for urge incontinence ? All exercises coordinated with pelvic floor mobility and breathing mechanics for improved technique and pelvic floor strengthening and coordination improvement for decreased leakage ? 2x10 bridge with ball squeeze ?Ipsilateral hand/knee press with ball 2x10 in HL ?Black band hip abduction 2x10 in HL ?Black band hip flexion 2x10 in HL ?Modified quad with high table bird dogs 2x10 with breathing mechanics and core activations ?Modified quad with high table with shoulder taps x10 ?X10 squats 5# kettle bell ?X10 mario punches ? ?02/26/22: (ortho appointment for Rt elbow pain) ?STM Rt wrist extensors and tendons ?Wrist pronation, supination and wrist extensor stretch, Rt x 30" each ?Discussion about benefits of elbow tendonitis brace and reviewed options online ?Demo of self-massage options to wrist extensors ?Seated with forearm propped: A/ROM x 10 each: wrist  circles each way, pron/sup, wrist ext with Lt UE assist into ext, Rt eccentric lowering ?Ice stick massage to Rt ECRB/L tendons x 2', explanation of ice cup massage option x 2' at home as needed for heightened pain days ?Ionto #1 to Rt common wrist extensor tendon with care instructions ? ?02/25/2022 Treatment: Pt consented to internal pelvic floor assessment this date vaginally. Pt directed in x5 pelvic floor contractions vaginally with VC for breathing and core activations and to decrease compensatory strategies. Pt also directed in 2x8s isometric holds  with cues for breathing, and 0I37 quick flicks. Pt demonstrated decreased strength, endurance, and coordination with pelvic floor muscles and benefited from cues to improve technique. Pt also educated in proper pelvic floor mobility technique throughout session and updated HEP to include pelvic floor exercises for home. Pt directed in additional pelvic floor contractions x10; 3x8s isometric holds, 0W88 quick flicks all in sitting and on small towel roll for improved feedback of tissue mobility. Pt reported this helped and better understood how to complete at home. Pt also given and educated on bladder diary to attempt at home.  ? ?EVAL 02/20/2022 exam completed, exam findings reviewed, HEP given for mobility at Rt wrist to address initially to decrease pain. Urge drill and bladder irritants also given to address urge incontinence.  ? ? ?  ?  ?PATIENT EDUCATION:  ?Education details:  ?02/25/22: HEP, bladder diary, technique for pelvic floor mobility ?02/26/22: Discussion about benefits of elbow tendonitis brace and reviewed options online, self-massage to tendons and muscles of forearm, ice cup massage x 2' as needed for more painful days ?03/19/2022: educated on continued HEP and coordination of PF and breathing with all exercises  ?Person educated: Patient ?Education method: Explanation, Demonstration, Tactile cues, Verbal cues, and Handouts ?Education comprehension:  verbalized understanding and returned demonstration ?  ?  ?[place]: ?[id number] ?  ?ASSESSMENT: ?  ?CLINICAL IMPRESSION: ? Pt reports further reduction in pain since starting a few of the exercises l

## 2022-03-20 ENCOUNTER — Encounter: Payer: PPO | Admitting: Physical Therapy

## 2022-03-20 DIAGNOSIS — L920 Granuloma annulare: Secondary | ICD-10-CM | POA: Diagnosis not present

## 2022-03-20 DIAGNOSIS — D485 Neoplasm of uncertain behavior of skin: Secondary | ICD-10-CM | POA: Diagnosis not present

## 2022-03-20 DIAGNOSIS — L821 Other seborrheic keratosis: Secondary | ICD-10-CM | POA: Diagnosis not present

## 2022-03-24 DIAGNOSIS — F419 Anxiety disorder, unspecified: Secondary | ICD-10-CM | POA: Diagnosis not present

## 2022-03-24 DIAGNOSIS — M810 Age-related osteoporosis without current pathological fracture: Secondary | ICD-10-CM | POA: Diagnosis not present

## 2022-03-24 DIAGNOSIS — Z6832 Body mass index (BMI) 32.0-32.9, adult: Secondary | ICD-10-CM | POA: Diagnosis not present

## 2022-03-24 DIAGNOSIS — E559 Vitamin D deficiency, unspecified: Secondary | ICD-10-CM | POA: Diagnosis not present

## 2022-03-24 DIAGNOSIS — E785 Hyperlipidemia, unspecified: Secondary | ICD-10-CM | POA: Diagnosis not present

## 2022-03-24 DIAGNOSIS — R7303 Prediabetes: Secondary | ICD-10-CM | POA: Diagnosis not present

## 2022-03-24 DIAGNOSIS — E039 Hypothyroidism, unspecified: Secondary | ICD-10-CM | POA: Diagnosis not present

## 2022-03-25 ENCOUNTER — Ambulatory Visit: Payer: PPO | Admitting: Physical Therapy

## 2022-03-31 ENCOUNTER — Encounter (HOSPITAL_BASED_OUTPATIENT_CLINIC_OR_DEPARTMENT_OTHER): Payer: PPO | Admitting: Dietician

## 2022-03-31 DIAGNOSIS — R7303 Prediabetes: Secondary | ICD-10-CM | POA: Diagnosis not present

## 2022-03-31 NOTE — Progress Notes (Deleted)
On 03/31/2022 patient completed Core Session 7 of Diabetes Prevention Program course virtually with [employer]. The following learning objectives were met by the patient during this class:  ? ?Virtual Visit via Video Note ? ?I connected with Keara L. Trumpler-Rich on 03/31/22 at 10:30 AM EDT by a video enabled application and verified that I am speaking with the correct person using two identifiers. ? ?Location: ?Patient: Virtual ?Provider: Office ? ?Learning Objectives: ?Define calorie balance. ?Explain how healthy eating and being active are related in terms of calorie balance.  ?Describe the relationship between calorie balance and weight loss.  ?Describe his or her progress as it relates to calorie balance.  ?Develop an activity plan for the coming week.  ? ?Goals:  ?Record weight taken outside of class.  ?Track foods and beverages eaten each day in the "Food and Activity Tracker," including calories and fat grams for each item.   ?Track activity type, minutes you were active, and distance you reached each day in the "Food and Activity Tracker."  ?Set aside one 20 to 30-minute block of time every day or find two or more periods of 10 to15 minutes each for physical activity.  ?Make a Physical Activities Plan for the Week.  ?Make active lifestyle choices all through the day  ?Stay at or go slightly over activity goal.  ? ?Follow-Up Plan: ?Attend Core Session 8 next week.  ?E-mail completed "Food and Activity Tracker" to Lifestyle Coach next week before class ? ?

## 2022-03-31 NOTE — Progress Notes (Signed)
On 03/31/2022 patient completed Core Session 7 of Diabetes Prevention Program course virtually with [employer] [facility]. The following learning objectives were met by the patient during this class:  ? ?Virtual Visit via Video Note ? ?I connected with Kimberly Ryan, May 27, 1951 on 03/31/22 at 10:30 AM EDT by a video enabled application and verified that I am speaking with the correct person using two identifiers. ? ?Location: ?Patient: Virtual ?Provider: Office ? ?Learning Objectives: ?Define calorie balance. ?Explain how healthy eating and being active are related in terms of calorie balance.  ?Describe the relationship between calorie balance and weight loss.  ?Describe his or her progress as it relates to calorie balance.  ?Develop an activity plan for the coming week.  ? ?Goals:  ?Record weight taken outside of class.  ?Track foods and beverages eaten each day in the "Food and Activity Tracker," including calories and fat grams for each item.   ?Track activity type, minutes you were active, and distance you reached each day in the "Food and Activity Tracker."  ?Set aside one 20 to 30-minute block of time every day or find two or more periods of 10 to15 minutes each for physical activity.  ?Make a Physical Activities Plan for the Week.  ?Make active lifestyle choices all through the day  ?Stay at or go slightly over activity goal.  ? ?Follow-Up Plan: ?Attend Core Session 8 next week.  ?E-mail completed "Food and Activity Tracker" to Lifestyle Coach next week before class ? ?

## 2022-03-31 NOTE — Progress Notes (Deleted)
On 03/31/2022 patient completed Core Session 7 of Diabetes Prevention Program course virtually with [employer] [facility]. The following learning objectives were met by the patient during this class:  ? ?Virtual Visit via Video Note ? ?I connected with Jema L. Trumpler-Rich on 03/31/22 at 10:30 AM EDT by a video enabled application and verified that I am speaking with the correct person using two identifiers. ? ?Location: ?Patient: Virtual ?Provider: Office ? ?Learning Objectives: ?Define calorie balance. ?Explain how healthy eating and being active are related in terms of calorie balance.  ?Describe the relationship between calorie balance and weight loss.  ?Describe his or her progress as it relates to calorie balance.  ?Develop an activity plan for the coming week.  ? ?Goals:  ?Record weight taken outside of class.  ?Track foods and beverages eaten each day in the "Food and Activity Tracker," including calories and fat grams for each item.   ?Track activity type, minutes you were active, and distance you reached each day in the "Food and Activity Tracker."  ?Set aside one 20 to 30-minute block of time every day or find two or more periods of 10 to15 minutes each for physical activity.  ?Make a Physical Activities Plan for the Week.  ?Make active lifestyle choices all through the day  ?Stay at or go slightly over activity goal.  ? ?Follow-Up Plan: ?Attend Core Session 8 next week.  ?E-mail completed "Food and Activity Tracker" to Lifestyle Coach next week before class ? ?

## 2022-04-01 ENCOUNTER — Ambulatory Visit: Payer: PPO | Admitting: Physical Therapy

## 2022-04-01 ENCOUNTER — Encounter: Payer: Self-pay | Admitting: Physical Therapy

## 2022-04-01 DIAGNOSIS — M6281 Muscle weakness (generalized): Secondary | ICD-10-CM

## 2022-04-01 DIAGNOSIS — R293 Abnormal posture: Secondary | ICD-10-CM

## 2022-04-01 DIAGNOSIS — M79631 Pain in right forearm: Secondary | ICD-10-CM

## 2022-04-01 NOTE — Therapy (Signed)
?OUTPATIENT PHYSICAL THERAPY TREATMENT NOTE ? ? ?Patient Name: Kimberly Ryan ?MRN: [id number] ?DOB:Oct 24, 1951, 71 y.o., female ?Today's Date: 04/01/2022 ? ?PCP: Leighton Ruff, MD (Inactive) ?REFERRING PROVIDER: Faustino Congress, NP ? ? PT End of Session - 04/01/22 1157   ? ? Visit Number 9   ? Date for PT Re-Evaluation 05/23/22   ? Authorization Type [employer]   ? Progress Note Due on Visit 20   ? PT Start Time 1147   ? PT Stop Time 1238   ? PT Time Calculation (min) 51 min   ? Activity Tolerance Patient tolerated treatment well   ? Behavior During Therapy [facility] for tasks assessed/performed   ? ?  ?  ? ?  ? ? ? ? ?Past Medical History:  ?Diagnosis Date  ? Anxiety   ? Arthritis   ? GERD (gastroesophageal reflux disease)   ? Headache(784.0)   ? Shortness of breath   ? with exertion   ? Thyroid disease   ? ?Past Surgical History:  ?Procedure Laterality Date  ? MYOMECTOMY  2000  ? RHINOPLASTY  1953  ? THYROID LOBECTOMY  08/17/2012  ? Procedure: THYROID LOBECTOMY;  Surgeon: [name], MD,FACS;  Location: [facility];  Service: General;  Laterality: Left;  Left Thyroid Lobectomy  ? TONSILLECTOMY  1954  ? ?Patient Active Problem List  ? Diagnosis Date Noted  ? Postsurgical hypothyroidism 11/04/2012  ? Multiple thyroid nodules 12/19/2011  ? ? ?REFERRING DIAG: M79.631 (ICD-10-CM) - Pain in right forearm  05/23/2022 ? ? ?THERAPY DIAG:  ?Muscle weakness (generalized) ? ?Pain in right forearm ? ?Abnormal posture ? ?PERTINENT HISTORY: nothing significant  ? ?PRECAUTIONS: none ? ?SUBJECTIVE: Playing at the [employer] services increased my forearm pain x 4 days but has calmed back down now.  I did wear my brace while playing. ? ?PAIN:  ?PAIN:  ?Are you having pain? Yes ?NPRS scale: 1-2/10 only with mobility and use of it ?Pain location: Rt forearm ?Pain orientation: Right and Posterior  ?PAIN TYPE: aching ?Pain description: sharp and aching  ?Aggravating factors: gripping, lifting, fine and gross grip ?Relieving  factors: ice  ? ? ? ? ? ?OBJECTIVE: FROM INITIAL EVALUATION 02/13/22 ?  ?COGNITION: ?           Overall cognitive status: Within functional limits for tasks assessed              ?            ?SENSATION: ?           Light touch: Appears intact ?           Proprioception: Appears intact ?  ?MUSCLE LENGTH: ?Bil hamstrings and adductors limited by 25% ?  ?  ?POSTURE:  ?Rounded shoulders, posterior pelvic tilt ?  ?PALPATION: ?  ?GENERAL TTP at Rt lateral forearm, (+) pain with resisted Rt wrist extension, (+) pain with passive ROM for pronation and wrist extension ?  ?ROM UE: LT UE all WFL ?           Rt UE: shoulder WFL; elbow WFL; wrist WFL however pain at end range in all directions and extension to neutral actively  ?  ?MMT UE: LT UE all WFL ?           Rt UE: shoulder WFL; elbow WFL however pain with flexion; wrist ext 3+/5; flexion 4/5; abduction and adduction 3+/5 ?  ?  ?  ?  ?PELVIC MMT: deferred until next session ?  ?MMT   ?  02/20/2022  ?Vaginal  3/5; 5s isometric; 3 reps   ?Internal Anal Sphincter    ?External Anal Sphincter    ?Puborectalis    ?Diastasis Recti    ?(Blank rows = not tested) ?  ?  ?TONE: ?Slightly decreased ?  ?PROLAPSE: ?None noted in hooklying at eval time  ?  ?  ?  ?TODAY'S TREATMENT  ?04/01/22: ortho treatment for Rt forearm: ?UBE L1 1x1 fwd/bwd PT present to discuss symptoms ?Rt wrist flexors and extensors stretch with overpressure 1x20" each ?Velcro board pron/sup long handle x 3, flex/ext long handle x 3, wrist flexion/ext x 2 reps ?Rt pron/sup at 90 deg x 10 holding 1lb ?2-way raises bil Ues 2lb fwd/diag x 10 rounds ?Manual therapy: STM wrist extensors and common tendon ?Ice pack end of session x 10' ? ? ?03/19/22: ortho treatment for Rt forearm: ?UBE L1 1x1 fwd/bwd PT present to monitor ?Velcro board pron/sup long handle x 3, flex/ext long handle x 3, wrist flexion/ext x 2 reps ?Standing row and shoulder ext red tband x 10 each ?2-way raises bil Ues 2lb fwd/diag (abd hurt elbow) ?Rt pron/sup  at 90 deg x 10 holding 1lb (tried 2lb but pain, put brace on) ?Supine 1lb at 90 deg wrist alphabet ?Manual therapy: STM to wrist extensors ?Ice pack to posterior forearm end of session x 8' ? ?03/13/2022 : Pelvic floor treatment: ?All exercises coordinated with pelvic floor mobility and breathing mechanics for improved technique and pelvic floor strengthening and coordination improvement for decreased leakage ?2x10 hip flexion in seated position 4# ankle weights with TA activation ?2x10 TA activation with ball squeeze  in hands in sitting  ?2x10 90/90 hooklying heel taps  ?2x10 ball squeeze bridge ?2x10 yellow band palloffs ? 2x10 yellow band rotational palloffs ?X10 squats ? ? ? ?03/13/22 (ortho appt for Rt elbow pain) ?STM Rt wrist extensors and tendons ?Standing Rt elbow flexion 2lb x 10 ?Standing pron/sup 1lb Rt arm at 30 deg flexion straight elbow (simulate pouring) ?Seated 1lb wrist circles 5x each way ?Seated 1lb wrist flexion and extension 1lb x 5 each ?Velcro board: pron/sup long handle x 3, flex/ext long handle x 3 ?Ice pack to wrist extensors x 5' end of session ? ?03/06/22: ?STM Rt wrist extensors and tendons, cross friction massage common extensor tendon ?Ice stick massage x 3' common extensor tendon ?Key grip and whole hand grip, gentle x 15 reps each using small squeeze ball ?1lb Rt pronation/supination x 15 reps ?1lb Rt wrist flexion/extension x 15lb thumb up forearm resting on mat table ?Ionto #2 to Rt common wrist extensor tendon with care instructions ? ?03/04/2022: Pelvic floor treatment for urge incontinence ? All exercises coordinated with pelvic floor mobility and breathing mechanics for improved technique and pelvic floor strengthening and coordination improvement for decreased leakage ? 2x10 bridge with ball squeeze ?Ipsilateral hand/knee press with ball 2x10 in HL ?Black band hip abduction 2x10 in HL ?Black band hip flexion 2x10 in HL ?Modified quad with high table bird dogs 2x10 with breathing  mechanics and core activations ?Modified quad with high table with shoulder taps x10 ?X10 squats 5# kettle bell ?X10 mario punches ? ?02/26/22: (ortho appointment for Rt elbow pain) ?STM Rt wrist extensors and tendons ?Wrist pronation, supination and wrist extensor stretch, Rt x 30" each ?Discussion about benefits of elbow tendonitis brace and reviewed options online ?Demo of self-massage options to wrist extensors ?Seated with forearm propped: A/ROM x 10 each: wrist circles each way, pron/sup, wrist ext with Lt  UE assist into ext, Rt eccentric lowering ?Ice stick massage to Rt ECRB/L tendons x 2', explanation of ice cup massage option x 2' at home as needed for heightened pain days ?Ionto #1 to Rt common wrist extensor tendon with care instructions ? ?02/25/2022 Treatment: Pt consented to internal pelvic floor assessment this date vaginally. Pt directed in x5 pelvic floor contractions vaginally with VC for breathing and core activations and to decrease compensatory strategies. Pt also directed in 2x8s isometric holds with cues for breathing, and 1R71 quick flicks. Pt demonstrated decreased strength, endurance, and coordination with pelvic floor muscles and benefited from cues to improve technique. Pt also educated in proper pelvic floor mobility technique throughout session and updated HEP to include pelvic floor exercises for home. Pt directed in additional pelvic floor contractions x10; 3x8s isometric holds, 1A57 quick flicks all in sitting and on small towel roll for improved feedback of tissue mobility. Pt reported this helped and better understood how to complete at home. Pt also given and educated on bladder diary to attempt at home.  ? ?EVAL 02/20/2022 exam completed, exam findings reviewed, HEP given for mobility at Rt wrist to address initially to decrease pain. Urge drill and bladder irritants also given to address urge incontinence.  ? ? ?  ?  ?PATIENT EDUCATION:  ?Education details:  ?02/25/22: HEP, bladder  diary, technique for pelvic floor mobility ?02/26/22: Discussion about benefits of elbow tendonitis brace and reviewed options online, self-massage to tendons and muscles of forearm, ice cup massage x 2' as

## 2022-04-03 ENCOUNTER — Encounter: Payer: PPO | Admitting: Physical Therapy

## 2022-04-03 ENCOUNTER — Ambulatory Visit: Payer: PPO | Admitting: Physical Therapy

## 2022-04-03 DIAGNOSIS — M79631 Pain in right forearm: Secondary | ICD-10-CM | POA: Diagnosis not present

## 2022-04-03 DIAGNOSIS — M6281 Muscle weakness (generalized): Secondary | ICD-10-CM

## 2022-04-03 DIAGNOSIS — R293 Abnormal posture: Secondary | ICD-10-CM

## 2022-04-03 DIAGNOSIS — R279 Unspecified lack of coordination: Secondary | ICD-10-CM

## 2022-04-03 NOTE — Therapy (Signed)
?OUTPATIENT PHYSICAL THERAPY TREATMENT NOTE ? ? ?Patient Name: Kimberly Ryan ?MRN: [id number] ?DOB:[birth year], 71 y.o., female ?Today's Date: 04/03/2022 ? ?PCP: Leighton Ruff, MD (Inactive) ?REFERRING PROVIDER: Faustino Congress, NP ? ? PT End of Session - 04/03/22 1448   ? ? Visit Number 10   ? Date for PT Re-Evaluation 05/23/22   ? Authorization Type [employer]   ? Progress Note Due on Visit 20   ? PT Start Time 1408   ? PT Stop Time 4132   ? PT Time Calculation (min) 39 min   ? Activity Tolerance Patient tolerated treatment well   ? Behavior During Therapy [facility] for tasks assessed/performed   ? ?  ?  ? ?  ? ? ? ?Past Medical History:  ?Diagnosis Date  ? Anxiety   ? Arthritis   ? GERD (gastroesophageal reflux disease)   ? Headache(784.0)   ? Shortness of breath   ? with exertion   ? Thyroid disease   ? ?Past Surgical History:  ?Procedure Laterality Date  ? MYOMECTOMY  2000  ? RHINOPLASTY  1953  ? THYROID LOBECTOMY  08/17/2012  ? Procedure: THYROID LOBECTOMY;  Surgeon: [name], MD,FACS;  Location: [facility];  Service: General;  Laterality: Left;  Left Thyroid Lobectomy  ? TONSILLECTOMY  1954  ? ?Patient Active Problem List  ? Diagnosis Date Noted  ? Postsurgical hypothyroidism 11/04/2012  ? Multiple thyroid nodules 12/19/2011  ? ? ?REFERRING DIAG: M79.631 (ICD-10-CM) - Pain in right forearm  05/23/2022 ? ? ?THERAPY DIAG:  ?Muscle weakness (generalized) ? ?Unspecified lack of coordination ? ?Abnormal posture ? ?PERTINENT HISTORY: nothing significant  ? ?PRECAUTIONS: none ? ?SUBJECTIVE: Arm has gotten better since Easter, most of the time urgency has been good. Urge drill has been helpful, no leakage and now urinating every 1.5 hours in the morning and 2 hours in afternoon and once per night. ? ?PAIN:  ?PAIN:  ?Are you having pain? Yes ?NPRS scale: 1-2/10 only with mobility and use of it ?Pain location: Rt forearm ?Pain orientation: Right and Posterior  ?PAIN TYPE: aching ?Pain description: sharp  and aching  ?Aggravating factors: gripping, lifting, fine and gross grip ?Relieving factors: ice  ? ? ?OBJECTIVE: FROM INITIAL EVALUATION 02/13/22 ?  ?COGNITION: ?           Overall cognitive status: Within functional limits for tasks assessed              ?            ?SENSATION: ?           Light touch: Appears intact ?           Proprioception: Appears intact ?  ?MUSCLE LENGTH: ?Bil hamstrings and adductors limited by 25% ?  ?  ?POSTURE:  ?Rounded shoulders, posterior pelvic tilt ?  ?PALPATION: ?  ?GENERAL TTP at Rt lateral forearm, (+) pain with resisted Rt wrist extension, (+) pain with passive ROM for pronation and wrist extension ?  ?ROM UE: LT UE all WFL ?           Rt UE: shoulder WFL; elbow WFL; wrist WFL however pain at end range in all directions and extension to neutral actively  ?  ?MMT UE: LT UE all WFL ?           Rt UE: shoulder WFL; elbow WFL however pain with flexion; wrist ext 3+/5; flexion 4/5; abduction and adduction 3+/5 ?  ?  ?  ?  ?PELVIC  MMT: deferred until next session ?  ?MMT   ?02/20/2022  ?Vaginal  3/5; 5s isometric; 3 reps   ?Internal Anal Sphincter    ?External Anal Sphincter    ?Puborectalis    ?Diastasis Recti    ?(Blank rows = not tested) ?  ?  ?TONE: ?Slightly decreased ?  ?PROLAPSE: ?None noted in hooklying at eval time  ?  ?  ?  ?TODAY'S TREATMENT ?04/03/2022 : pelvic floor  ?All exercises coordinated with pelvic floor mobility and breathing mechanics for improved technique and pelvic floor strengthening and coordination improvement for decreased leakage ?Nustep 5 mins L3 for improved activity for challenge on pelvic floor  ?Ball squeezes with bridges 2x10 ?Sidelying ball press with hip abduction x10 ?Sit to stand 5# 2x10 ?Opp arm/knee press with ball 2x10 ? ? ? ?04/01/22: ortho treatment for Rt forearm: ?UBE L1 1x1 fwd/bwd PT present to discuss symptoms ?Rt wrist flexors and extensors stretch with overpressure 1x20" each ?Velcro board pron/sup long handle x 3, flex/ext long handle x  3, wrist flexion/ext x 2 reps ?Rt pron/sup at 90 deg x 10 holding 1lb ?2-way raises bil Ues 2lb fwd/diag x 10 rounds ?Manual therapy: STM wrist extensors and common tendon ?Ice pack end of session x 10' ? ? ?03/19/22: ortho treatment for Rt forearm: ?UBE L1 1x1 fwd/bwd PT present to monitor ?Velcro board pron/sup long handle x 3, flex/ext long handle x 3, wrist flexion/ext x 2 reps ?Standing row and shoulder ext red tband x 10 each ?2-way raises bil Ues 2lb fwd/diag (abd hurt elbow) ?Rt pron/sup at 90 deg x 10 holding 1lb (tried 2lb but pain, put brace on) ?Supine 1lb at 90 deg wrist alphabet ?Manual therapy: STM to wrist extensors ?Ice pack to posterior forearm end of session x 8' ? ?03/13/2022 : Pelvic floor treatment: ?All exercises coordinated with pelvic floor mobility and breathing mechanics for improved technique and pelvic floor strengthening and coordination improvement for decreased leakage ?2x10 hip flexion in seated position 4# ankle weights with TA activation ?2x10 TA activation with ball squeeze  in hands in sitting  ?2x10 90/90 hooklying heel taps  ?2x10 ball squeeze bridge ?2x10 yellow band palloffs ? 2x10 yellow band rotational palloffs ?X10 squats ? ? ? ?03/13/22 (ortho appt for Rt elbow pain) ?STM Rt wrist extensors and tendons ?Standing Rt elbow flexion 2lb x 10 ?Standing pron/sup 1lb Rt arm at 30 deg flexion straight elbow (simulate pouring) ?Seated 1lb wrist circles 5x each way ?Seated 1lb wrist flexion and extension 1lb x 5 each ?Velcro board: pron/sup long handle x 3, flex/ext long handle x 3 ?Ice pack to wrist extensors x 5' end of session ? ?03/06/22: ?STM Rt wrist extensors and tendons, cross friction massage common extensor tendon ?Ice stick massage x 3' common extensor tendon ?Key grip and whole hand grip, gentle x 15 reps each using small squeeze ball ?1lb Rt pronation/supination x 15 reps ?1lb Rt wrist flexion/extension x 15lb thumb up forearm resting on mat table ?Ionto #2 to Rt common  wrist extensor tendon with care instructions ? ?03/04/2022: Pelvic floor treatment for urge incontinence ? All exercises coordinated with pelvic floor mobility and breathing mechanics for improved technique and pelvic floor strengthening and coordination improvement for decreased leakage ? 2x10 bridge with ball squeeze ?Ipsilateral hand/knee press with ball 2x10 in HL ?Black band hip abduction 2x10 in HL ?Black band hip flexion 2x10 in HL ?Modified quad with high table bird dogs 2x10 with breathing mechanics and core activations ?Modified  quad with high table with shoulder taps x10 ?X10 squats 5# kettle bell ?X10 mario punches ? ?02/26/22: (ortho appointment for Rt elbow pain) ?STM Rt wrist extensors and tendons ?Wrist pronation, supination and wrist extensor stretch, Rt x 30" each ?Discussion about benefits of elbow tendonitis brace and reviewed options online ?Demo of self-massage options to wrist extensors ?Seated with forearm propped: A/ROM x 10 each: wrist circles each way, pron/sup, wrist ext with Lt UE assist into ext, Rt eccentric lowering ?Ice stick massage to Rt ECRB/L tendons x 2', explanation of ice cup massage option x 2' at home as needed for heightened pain days ?Ionto #1 to Rt common wrist extensor tendon with care instructions ? ?02/25/2022 Treatment: Pt consented to internal pelvic floor assessment this date vaginally. Pt directed in x5 pelvic floor contractions vaginally with VC for breathing and core activations and to decrease compensatory strategies. Pt also directed in 2x8s isometric holds with cues for breathing, and 9Q11 quick flicks. Pt demonstrated decreased strength, endurance, and coordination with pelvic floor muscles and benefited from cues to improve technique. Pt also educated in proper pelvic floor mobility technique throughout session and updated HEP to include pelvic floor exercises for home. Pt directed in additional pelvic floor contractions x10; 3x8s isometric holds, 9E17 quick  flicks all in sitting and on small towel roll for improved feedback of tissue mobility. Pt reported this helped and better understood how to complete at home. Pt also given and educated on bladder diary to a

## 2022-04-07 ENCOUNTER — Encounter (HOSPITAL_BASED_OUTPATIENT_CLINIC_OR_DEPARTMENT_OTHER): Payer: PPO | Admitting: Dietician

## 2022-04-07 DIAGNOSIS — R7303 Prediabetes: Secondary | ICD-10-CM

## 2022-04-07 NOTE — Progress Notes (Signed)
On 04/07/2022 patient completed Core Session 8 of Diabetes Prevention Program course virtually with [employer] [facility]. The following learning objectives were met by the patient during this class:  ? ?Virtual Visit via Video Note ? ?I connected with Kimberly Ryan, [birth year] by a video enabled application and verified that I am speaking with the correct person using two identifiers. ? ?Location: ?Patient: Virtual ?Provider: Office ? ?Learning Objectives: ?Recognize positive and negative food and activity cues.  ?Change negative food and activity cues to positive cues.  ?Add positive cues for activity and eliminate cues for inactivity.  ?Develop a plan for removing one problem food cue for the coming week.  ? ?Goals:  ?Record weight taken outside of class.  ?Track foods and beverages eaten each day in the "Food and Activity Tracker," including calories and fat grams for each item.   ?Track activity type, minutes you were active, and distance you reached each day in the "Food and Activity Tracker."  ?Set aside one 20 to 30-minute block of time every day or find two or more periods of 10 to15 minutes each for physical activity.  ?Remove one problem food cue.  ?Add one positive cue for being more active. ? ?Follow-Up Plan: ?Attend Core Session 9 next week.  ?Email completed "Food and Activity Tracker" next week to be reviewed by [facility]. ? ?

## 2022-04-08 ENCOUNTER — Encounter: Payer: PPO | Admitting: Physical Therapy

## 2022-04-08 DIAGNOSIS — E890 Postprocedural hypothyroidism: Secondary | ICD-10-CM | POA: Diagnosis not present

## 2022-04-08 DIAGNOSIS — E559 Vitamin D deficiency, unspecified: Secondary | ICD-10-CM | POA: Diagnosis not present

## 2022-04-08 DIAGNOSIS — M810 Age-related osteoporosis without current pathological fracture: Secondary | ICD-10-CM | POA: Diagnosis not present

## 2022-04-09 ENCOUNTER — Encounter: Payer: PPO | Admitting: Physical Therapy

## 2022-04-10 ENCOUNTER — Encounter: Payer: PPO | Admitting: Physical Therapy

## 2022-04-14 ENCOUNTER — Encounter: Payer: PPO | Attending: Family Medicine | Admitting: Dietician

## 2022-04-14 DIAGNOSIS — R7303 Prediabetes: Secondary | ICD-10-CM | POA: Diagnosis not present

## 2022-04-14 NOTE — Progress Notes (Signed)
On 04/14/2022 patient completed Core Session 9 of Diabetes Prevention Program course virtually with [employer] [facility]. The following learning objectives were met by the patient during this class:  ? ?Virtual Visit via Video Note ? ?I connected with Kimberly Ryan Rich, 12-01-1951 by a video enabled application and verified that I am speaking with the correct person using two identifiers. ? ?Location: ?Patient: Virtual ?Provider: Office ? ?Learning Objectives: ?List and describe five steps to problem solving.  ?Apply the five problem solving steps to resolve a problem he or she has with eating less fat and fewer calories or being more active.  ? ?Goals:  ?Record weight taken outside of class.  ?Track foods and beverages eaten each day in the "Food and Activity Tracker," including calories and fat grams for each item.   ?Track activity type, minutes you were active, and distance you reached each day in the "Food and Activity Tracker."  ?Set aside one 20 to 30-minute block of time every day or find two or more periods of 10 to15 minutes each for physical activity.  ?Use problem solving action plan created during session to problem solve.  ? ?Follow-Up Plan: ?Attend Core Session 10 next week.  ?Email completed "Food and Activity Tracker" next week to be reviewed by Lifestyle Coach. ?Email menus from favorite restaurants to next session for future discussion.  ? ?

## 2022-04-17 ENCOUNTER — Ambulatory Visit: Payer: PPO | Attending: Family Medicine | Admitting: Physical Therapy

## 2022-04-17 ENCOUNTER — Encounter: Payer: Self-pay | Admitting: Physical Therapy

## 2022-04-17 DIAGNOSIS — M6281 Muscle weakness (generalized): Secondary | ICD-10-CM | POA: Diagnosis not present

## 2022-04-17 DIAGNOSIS — M79631 Pain in right forearm: Secondary | ICD-10-CM | POA: Diagnosis not present

## 2022-04-17 NOTE — Therapy (Signed)
?OUTPATIENT PHYSICAL THERAPY TREATMENT NOTE ? ? ?Patient Name: Kimberly Ryan ?MRN: [id number] ?DOB:[birth year], 71 y.o., female ?Today's Date: 04/17/2022 ? ?PCP: Leighton Ruff, MD (Inactive) ?REFERRING PROVIDER: Faustino Congress, NP ? ? PT End of Session - 04/17/22 1402   ? ? Visit Number 11   ? Date for PT Re-Evaluation 05/23/22   ? Authorization Type [employer]   ? Progress Note Due on Visit 20   ? PT Start Time 1402   ? PT Stop Time 4888   ? PT Time Calculation (min) 43 min   ? Activity Tolerance Patient tolerated treatment well   ? Behavior During Therapy [facility] for tasks assessed/performed   ? ?  ?  ? ?  ? ? ? ? ? ?Past Medical History:  ?Diagnosis Date  ? Anxiety   ? Arthritis   ? GERD (gastroesophageal reflux disease)   ? Headache(784.0)   ? Shortness of breath   ? with exertion   ? Thyroid disease   ? ?Past Surgical History:  ?Procedure Laterality Date  ? MYOMECTOMY  2000  ? RHINOPLASTY  1953  ? THYROID LOBECTOMY  08/17/2012  ? Procedure: THYROID LOBECTOMY;  Surgeon: [name], MD,FACS;  Location: [facility];  Service: General;  Laterality: Left;  Left Thyroid Lobectomy  ? TONSILLECTOMY  1954  ? ?Patient Active Problem List  ? Diagnosis Date Noted  ? Postsurgical hypothyroidism 11/04/2012  ? Multiple thyroid nodules 12/19/2011  ? ? ?REFERRING DIAG: M79.631 (ICD-10-CM) - Pain in right forearm  05/23/2022 ? ? ?THERAPY DIAG:  ?Muscle weakness (generalized) ? ?Pain in right forearm ? ?PERTINENT HISTORY: nothing significant  ? ?PRECAUTIONS: none ? ?SUBJECTIVE: Pain ranges from 0 (rest) to 3.5 with activity.  I use the brace but am not sure it helps.   ? ?PAIN:  ?PAIN:  ?Are you having pain? Yes ?NPRS scale: 0-3.5/10 only with mobility and use of it ?Pain location: Rt forearm ?Pain orientation: Right and Posterior  ?PAIN TYPE: aching ?Pain description: sharp and aching  ?Aggravating factors: gripping, lifting, fine and gross grip ?Relieving factors: ice  ? ? ? ? ? ?OBJECTIVE: FROM INITIAL EVALUATION  02/13/22 ?  ?COGNITION: ?           Overall cognitive status: Within functional limits for tasks assessed              ?            ?SENSATION: ?           Light touch: Appears intact ?           Proprioception: Appears intact ?  ?MUSCLE LENGTH: ?Bil hamstrings and adductors limited by 25% ?  ?  ?POSTURE:  ?Rounded shoulders, posterior pelvic tilt ?  ?PALPATION: ?  ?GENERAL TTP at Rt lateral forearm, (+) pain with resisted Rt wrist extension, (+) pain with passive ROM for pronation and wrist extension ?  ?ROM UE: LT UE all WFL ?           Rt UE: shoulder WFL; elbow WFL; wrist WFL however pain at end range in all directions and extension to neutral actively  ?  ?MMT UE: LT UE all WFL ?           Rt UE: shoulder WFL; elbow WFL however pain with flexion; wrist ext 3+/5; flexion 4/5; abduction and adduction 3+/5 ?  ?  ?  ?  ?PELVIC MMT: deferred until next session ?  ?MMT   ?02/20/2022  ?Vaginal  3/5;  5s isometric; 3 reps   ?Internal Anal Sphincter    ?External Anal Sphincter    ?Puborectalis    ?Diastasis Recti    ?(Blank rows = not tested) ?  ?  ?TONE: ?Slightly decreased ?  ?PROLAPSE: ?None noted in hooklying at eval time  ?  ?  ?  ?TODAY'S TREATMENT  ?04/17/22: ortho treatment for Rt forearm: ?UBE L2 1x1 fwd/bwd PT present to discuss symptom behavior ?Seated Rt wrist 1lb dumbell: 2x10 extension, flexion, ulnar and radial deviation, pronation/supination ?Small ball squeeze and twist on towel to simulate jar opening x 10 ?Ball squeeze and hold/release x 10 ?Standing bicep curl in supination x 10 3lb, in pronation 2lb x 10 ?Manual therapy: STM posterior Rt forearm ? ?04/01/22: ortho treatment for Rt forearm: ?UBE L1 1x1 fwd/bwd PT present to discuss symptoms ?Rt wrist flexors and extensors stretch with overpressure 1x20" each ?Velcro board pron/sup long handle x 3, flex/ext long handle x 3, wrist flexion/ext x 2 reps ?Rt pron/sup at 90 deg x 10 holding 1lb ?2-way raises bil Ues 2lb fwd/diag x 10 rounds ?Manual therapy: STM  wrist extensors and common tendon ?Ice pack end of session x 10' ? ? ? ? ?  ?  ?PATIENT EDUCATION:  ?Access Code: [email or web address] ?URL: [email or web address] ?Date: 04/17/2022 ?Prepared by: Venetia Night Christiane Sistare ? ?Exercises ?- Seated Wrist Extension with Dumbbell  - 1 x daily - 7 x weekly - 2 sets - 10 reps ?- Seated Wrist Flexion with Dumbbell  - 1 x daily - 7 x weekly - 2 sets - 10 reps ?- Seated Wrist Radial Deviation with Dumbbell  - 1 x daily - 7 x weekly - 2 sets - 10 reps ?- Seated Wrist Ulnar Deviation with Dumbbell  - 1 x daily - 7 x weekly - 2 sets - 10 reps ?- Forearm Pronation and Supination with Hammer  - 1 x daily - 7 x weekly - 2 sets - 10 reps ?- Putty Squeezes  - 1 x daily - 7 x weekly - 1 sets - 60 reps ?- Standing Pronated Elbow Flexion with Dumbbell  - 1 x daily - 7 x weekly - 2 sets - 10 reps ?- Standing Bicep Curls Supinated with Dumbbells  - 1 x daily - 7 x weekly - 2 sets - 10 reps ?- Standing Shoulder Flexion to 90 Degrees with Dumbbells  - 1 x daily - 7 x weekly - 2 sets - 5 reps ?- Scaption with Dumbbells  - 1 x daily - 7 x weekly - 2 sets - 5 reps ? ?Education details:  ?02/25/22: HEP, bladder diary, technique for pelvic floor mobility ?02/26/22: Discussion about benefits of elbow tendonitis brace and reviewed options online, self-massage to tendons and muscles of forearm, ice cup massage x 2' as needed for more painful days ?04/17/2022: educated on continued HEP and coordination of PF and breathing with all exercises  ?Person educated: Patient ?Education method: Explanation, Demonstration, Tactile cues, Verbal cues, and Handouts ?Education comprehension: verbalized understanding and returned demonstration ?  ?  ?[place]: ?[email or web address] ?Access Code: [email or web address] (wrist only) ?  ?ASSESSMENT: ?  ?CLINICAL IMPRESSION: ? Pt was able to advance wrist strengthening with 1lb weight for all cardinal plane movements today with increased reps without pain, just fatigue.  She is no longer  tender along common extensor tendon.  PT educated Pt on proper donning and fit of elbow brace as she was wearing it too low and too loosely.  PT had Pt wear  brace throughout therex today which likely helped tolerance of increased reps.  Pt traveling and will check back in in 2-3 weeks to see how advanced HEP is going.  ?  ?  ?OBJECTIVE IMPAIRMENTS decreased activity tolerance, decreased coordination, decreased endurance, decreased mobility, decreased strength, increased fascial restrictions, impaired perceived functional ability, increased muscle spasms, impaired flexibility, improper body mechanics, postural dysfunction, prosthetic dependency , and pain.  ?  ?ACTIVITY LIMITATIONS community activity, driving, meal prep, occupation, laundry, yard work, and shopping.  ?  ?PERSONAL FACTORS Time since onset of injury/illness/exacerbation are also affecting patient's functional outcome.  ?  ?  ?REHAB POTENTIAL: Good ?  ?CLINICAL DECISION MAKING: Stable/uncomplicated ?  ?EVALUATION COMPLEXITY: Low ?  ?  ?GOALS: ?Goals reviewed with patient? Yes ?  ?SHORT TERM GOALS: ?  ?Pt to be I with HEP.  ?Baseline:  ?Target date: 03/20/2022 ?Goal status: INITIAL ?  ?2.  Pt to demonstrate at least 3/5 internal pelvic floor strength for improved pelvic stability and decreased leakage. ?Baseline:  ?Target date: 03/20/2022 ?Goal status: INITIAL ?  ?3.  Pt to report no more than 3/10 pain at Rt forearm for improved ability to complete piano lessens with less pain.  ?Baseline:  ?Target date: 03/20/2022 ?Goal status: INITIAL ?  ?4.  Pt to report no more than one instance of urinary leakage per week for improved QOL and decreased symptoms.  ?Baseline:  ?Target date: 03/20/2022 ?Goal status: INITIAL ?  ?  ?  ?LONG TERM GOALS: ?  ?  ?Pt to be I with advanced HEP.  ?Baseline:  ?Target date: 05/23/2022 ?Goal status: INITIAL ?  ?2.  Pt to demonstrate at least 4/5 internal pelvic floor strength for improved pelvic stability and decreased leakage. ?Baseline:   ?Target date: 05/23/2022 ?Goal status: INITIAL ?  ?3.  Pt to report no more than 1/10 pain at Rt forearm for improved ability to complete piano lessens with less pain.  ?Baseline:  ?Target date: 05/23/2022 ?Goal

## 2022-04-21 ENCOUNTER — Encounter: Payer: PPO | Admitting: Dietician

## 2022-04-21 DIAGNOSIS — R7303 Prediabetes: Secondary | ICD-10-CM

## 2022-04-21 NOTE — Progress Notes (Signed)
On 04/21/2022 patient attended a virtual grocery store tour session as part of the Diabetes Prevention Program with [employer] [facility] [employer] [facility]. ? ?Virtual Visit via Video Note ? ?I connected with Kimberly Ryan by a video enabled application and verified that I am speaking with the correct person using two identifiers. ? ?Location: ?Patient: home ?Provider: office ? ? Learning Objectives: ?Develop a plan for our grocery shopping experience ?Putting together a list ?How to navigate the grocery store ?Identify 4 main sections of the grocery store ?Produce ?Meat/Poultry/Fish ?Dairy  ?Inside Aisles ?Consider tips for shopping in each of these four sections ?Reflect on our own shopping habits ?Create a new goal for our next grocery shopping experience ?Engage in a group discussion ? ?Goals:  ?Record weight taken outside of class.  ?Track foods and beverages eaten each day in the "Food and Activity Tracker," including calories and fat grams for each item.  ?Create one new goal for the next grocery shopping experience based on the information provided today ? ?Follow-Up Plan: ?Attend next session.  ?Email completed "Food and Activity Tracker" before next session to be reviewed by Lifestyle Coach.   ?

## 2022-04-22 ENCOUNTER — Encounter: Payer: Self-pay | Admitting: Dietician

## 2022-05-01 ENCOUNTER — Encounter: Payer: Self-pay | Admitting: Dietician

## 2022-05-01 ENCOUNTER — Encounter: Payer: PPO | Admitting: Dietician

## 2022-05-01 DIAGNOSIS — R7303 Prediabetes: Secondary | ICD-10-CM

## 2022-05-01 NOTE — Progress Notes (Signed)
On 05/01/2022 patient completed a post core session of the Diabetes Prevention Program course virtually with [facility]. By the end of this session patients are able to complete the following objectives:   Virtual Visit via Video Note  I connected with Kimberly Ryan by a video enabled application and verified that I am speaking with the correct person using two identifiers.   I discussed the limitations of evaluation and management by telemedicine and the availability of in person appointments. The patient expressed understanding and agreed to proceed.  Location: Patient: home (virtual) Provider: office  Learning Objectives: Identify which foods contain carbohydrates.  List functions for carbohydrates on the body.  Describe the relationship between carbohydrate intake and blood sugar.  Create balanced snack choices.   Goals:  Record weight taken outside of class.  Track foods and beverages eaten each day in the "Food and Activity Tracker," including calories and fat grams for each item.   Track activity type, minutes you were active, and distance you reached each day in the "Food and Activity Tracker."   Follow-Up Plan: Attend next session.  Email completed "Food and Activity Trackers" before next session to be reviewed by Lifestyle Coach.

## 2022-05-05 ENCOUNTER — Encounter: Payer: PPO | Admitting: Dietician

## 2022-05-05 ENCOUNTER — Encounter: Payer: Self-pay | Admitting: Dietician

## 2022-05-05 DIAGNOSIS — R7303 Prediabetes: Secondary | ICD-10-CM

## 2022-05-05 NOTE — Progress Notes (Unsigned)
Waiting for smart phrase

## 2022-05-08 ENCOUNTER — Encounter: Payer: Self-pay | Admitting: Physical Therapy

## 2022-05-08 ENCOUNTER — Ambulatory Visit: Payer: PPO | Admitting: Physical Therapy

## 2022-05-08 DIAGNOSIS — M6281 Muscle weakness (generalized): Secondary | ICD-10-CM | POA: Diagnosis not present

## 2022-05-08 DIAGNOSIS — M79631 Pain in right forearm: Secondary | ICD-10-CM

## 2022-05-08 NOTE — Therapy (Signed)
OUTPATIENT PHYSICAL THERAPY TREATMENT NOTE   Patient Name: Kimberly Ryan MRN: [id number] DOB:1951-03-14, 71 y.o., female Today's Date: 05/08/2022  PCP: Leighton Ruff, MD (Inactive) REFERRING PROVIDER: Faustino Congress, NP   PT End of Session - 05/08/22 1402     Visit Number 12    Date for PT Re-Evaluation 05/23/22    Authorization Type [employer]    Progress Note Due on Visit 5    PT Start Time 1402    PT Stop Time 1441    PT Time Calculation (min) 39 min    Activity Tolerance Patient tolerated treatment well    Behavior During Therapy WFL for tasks assessed/performed                 Past Medical History:  Diagnosis Date   Anxiety    Arthritis    GERD (gastroesophageal reflux disease)    Headache(784.0)    Shortness of breath    with exertion    Thyroid disease    Past Surgical History:  Procedure Laterality Date   MYOMECTOMY  2000   [place]   THYROID LOBECTOMY  08/17/2012   Procedure: THYROID LOBECTOMY;  Surgeon: [name], MD,FACS;  Location: [facility];  Service: General;  Laterality: Left;  Left Thyroid Lobectomy   TONSILLECTOMY  1954   Patient Active Problem List   Diagnosis Date Noted   Postsurgical hypothyroidism 11/04/2012   Multiple thyroid nodules 12/19/2011    REFERRING DIAG: M79.631 (ICD-10-CM) - Pain in right forearm  05/23/2022   THERAPY DIAG:  Muscle weakness (generalized)  Pain in right forearm  PERTINENT HISTORY: nothing significant   PRECAUTIONS: none  SUBJECTIVE: I have only done my exercises 2x in the last 2 weeks.  I did the HEP without the brace and had some pain.  My worst pain reaches 2/10 with use.  Overall I'm 80% better.    PAIN:  PAIN:  Are you having pain? Yes NPRS scale: 0-2/10 only with mobility and use of it Pain location: Rt forearm Pain orientation: Right and Posterior  PAIN TYPE: aching Pain description: sharp and aching  Aggravating factors: gripping, lifting, fine and  gross grip Relieving factors: ice    OBJECTIVE: FROM INITIAL EVALUATION 02/13/22   COGNITION:            Overall cognitive status: Within functional limits for tasks assessed                          SENSATION:            Light touch: Appears intact            Proprioception: Appears intact   MUSCLE LENGTH: Bil hamstrings and adductors limited by 25%     POSTURE:  Rounded shoulders, posterior pelvic tilt   PALPATION:   GENERAL TTP at Rt lateral forearm, (+) pain with resisted Rt wrist extension, (+) pain with passive ROM for pronation and wrist extension   ROM UE: LT UE all WFL            evaluation: Rt UE: shoulder WFL; elbow WFL; wrist WFL however pain at end range in all directions and extension to neutral actively  05/08/22: Wrist ROM WNL, symmetrical to Lt, painfree   MMT UE: LT UE all WFL 05/08/22:5/5 elbow and Rt wrist, mild discomfort on resisted wrist ext  Rt UE: elbow flexion, wrist             Rt  UE: shoulder WFL; elbow WFL however pain with flexion; wrist ext 3+/5; flexion 4/5; abduction and adduction 3+/5         PELVIC MMT: deferred until next session   MMT   02/20/2022  Vaginal  3/5; 5s isometric; 3 reps   Internal Anal Sphincter    External Anal Sphincter    Puborectalis    Diastasis Recti    (Blank rows = not tested)     TONE: Slightly decreased   PROLAPSE: None noted in hooklying at eval time        TODAY'S TREATMENT  05/08/22 UBE L2 1x1 fwd and bwd PT present to discuss progress Seated Rt wrist 1lb dumbell: 2x10 extension, flexion, ulnar and radial deviation, pronation/supination Bicep curls supinated 3lb x 10, pronated 2lb x 10 2-way raises bil UE 2lb fwd/diag x 10 Small ball squeeze and twist on towel to simulate jar opening x 10   04/17/22: ortho treatment for Rt forearm: UBE L2 1x1 fwd/bwd PT present to discuss symptom behavior Seated Rt wrist 1lb dumbell: 2x10 extension, flexion, ulnar and radial deviation, pronation/supination Small  ball squeeze and twist on towel to simulate jar opening x 10 Ball squeeze and hold/release x 10 Standing bicep curl in supination x 10 3lb, in pronation 2lb x 10 Manual therapy: STM posterior Rt forearm  04/01/22: ortho treatment for Rt forearm: UBE L1 1x1 fwd/bwd PT present to discuss symptoms Rt wrist flexors and extensors stretch with overpressure 1x20" each Velcro board pron/sup long handle x 3, flex/ext long handle x 3, wrist flexion/ext x 2 reps Rt pron/sup at 90 deg x 10 holding 1lb 2-way raises bil Ues 2lb fwd/diag x 10 rounds Manual therapy: STM wrist extensors and common tendon Ice pack end of session x 10'         PATIENT EDUCATION:  Access Code: [email or web address] URL: [email or web address] Date: 04/17/2022 Prepared by: Venetia Night Kimberly Ryan  Exercises - Seated Wrist Extension with Dumbbell  - 1 x daily - 7 x weekly - 2 sets - 10 reps - Seated Wrist Flexion with Dumbbell  - 1 x daily - 7 x weekly - 2 sets - 10 reps - Seated Wrist Radial Deviation with Dumbbell  - 1 x daily - 7 x weekly - 2 sets - 10 reps - Seated Wrist Ulnar Deviation with Dumbbell  - 1 x daily - 7 x weekly - 2 sets - 10 reps - Forearm Pronation and Supination with Hammer  - 1 x daily - 7 x weekly - 2 sets - 10 reps - Putty Squeezes  - 1 x daily - 7 x weekly - 1 sets - 60 reps - Standing Pronated Elbow Flexion with Dumbbell  - 1 x daily - 7 x weekly - 2 sets - 10 reps - Standing Bicep Curls Supinated with Dumbbells  - 1 x daily - 7 x weekly - 2 sets - 10 reps - Standing Shoulder Flexion to 90 Degrees with Dumbbells  - 1 x daily - 7 x weekly - 2 sets - 5 reps - Scaption with Dumbbells  - 1 x daily - 7 x weekly - 2 sets - 5 reps  Education details:  02/25/22: HEP, bladder diary, technique for pelvic floor mobility 02/26/22: Discussion about benefits of elbow tendonitis brace and reviewed options online, self-massage to tendons and muscles of forearm, ice cup massage x 2' as needed for more painful  days educated on continued HEP and coordination of PF and breathing with all exercises  Person educated:  Patient Education method: Explanation, Demonstration, Tactile cues, Verbal cues, and Handouts Education comprehension: verbalized understanding and returned demonstration     HOME EXERCISE PROGRAM: [email or web address] Access Code: [email or web address] (wrist only)   ASSESSMENT:   CLINICAL IMPRESSION:  Pt returns after several weeks away from PT.  She admits she has been inconsistent with HEP and did have some pain when she performed it but realized she forgot to wear her elbow brace.  She was able to perform with brace on in PT session today without any pain.  She reports her pain only reaches 2/10 with use.  She has full painfree wrist ROM today and 5/5 strength with mild discomfort with wrist extension testing.  Pt has met most goals and nearly met pain goal.  She is ready for D/C to HEP which PT encouraged her to perform with elbow brace on 3 days a week.     OBJECTIVE IMPAIRMENTS decreased activity tolerance, decreased coordination, decreased endurance, decreased mobility, decreased strength, increased fascial restrictions, impaired perceived functional ability, increased muscle spasms, impaired flexibility, improper body mechanics, postural dysfunction, prosthetic dependency , and pain.    ACTIVITY LIMITATIONS community activity, driving, meal prep, occupation, laundry, yard work, and shopping.    PERSONAL FACTORS Time since onset of injury/illness/exacerbation are also affecting patient's functional outcome.      REHAB POTENTIAL: Good   CLINICAL DECISION MAKING: Stable/uncomplicated   EVALUATION COMPLEXITY: Low     GOALS: Goals reviewed with patient? Yes   SHORT TERM GOALS:   Pt to be I with HEP.  Baseline:  Target date: 03/20/2022 Goal status: MET   2.  Pt to demonstrate at least 3/5 internal pelvic floor strength for improved pelvic stability and decreased leakage. Baseline:  Target  date: 03/20/2022 Goal status: MET   3.  Pt to report no more than 3/10 pain at Rt forearm for improved ability to complete piano lessens with less pain.  Baseline:  Target date: 03/20/2022 Goal status: MET   4.  Pt to report no more than one instance of urinary leakage per week for improved QOL and decreased symptoms.  Baseline:  Target date: 03/20/2022 Goal status: MET       LONG TERM GOALS:     Pt to be I with advanced HEP.  Baseline:  Target date: 05/23/2022 Goal status: met   2.  Pt to demonstrate at least 4/5 internal pelvic floor strength for improved pelvic stability and decreased leakage. Baseline:  Target date: 05/23/2022 Goal status: MET, PELVIC FLOOR PT D/C'D   3.  Pt to report no more than 1/10 pain at Rt forearm for improved ability to complete piano lessens with less pain.  Baseline:  Target date: 05/23/2022 Goal status: partially met, pain can reach a 2/10   4.  Pt to report no more than one instance of urinary leakage per month for improved QOL and decreased symptoms.  Baseline:  Target date: 05/23/2022 Goal status: MET, PELVIC FLOOR PT D/C'D 5.  Pt to report ability to wait at least 2.5 hours between bladder voids to decrease urinary urge and frequency to restroom for more functional times.  Baseline:  Target date: 05/23/2022 Goal status: MET, PELVIC FLOOR PT D/C'D PLAN: PT FREQUENCY: 2x/week   PT DURATION: 8 weeks   PLANNED INTERVENTIONS: Therapeutic exercises, Therapeutic activity, Neuromuscular re-education, Patient/Family education, Joint manipulation, Joint mobilization, Dry Needling, Electrical stimulation, Cryotherapy, Moist heat, Manual lymph drainage, Compression bandaging, scar mobilization, Taping, Ultrasound, Ionotophoresis 4mg /ml Dexamethasone, and Manual therapy   PLAN FOR NEXT  SESSION: continue wrist strength progression as tol, STM and ice as needed    PHYSICAL THERAPY DISCHARGE SUMMARY  Visits from Start of Care: 12  Current functional level  related to goals / functional outcomes: See above   Remaining deficits: See above   Education / Equipment: HEP   Patient agrees to discharge. Patient goals were met. Patient is being discharged due to meeting the stated rehab goals.  Mycah Formica, PT 05/08/22 2:43 PM

## 2022-05-19 ENCOUNTER — Encounter: Payer: PPO | Attending: Family Medicine | Admitting: Dietician

## 2022-05-19 DIAGNOSIS — R7303 Prediabetes: Secondary | ICD-10-CM | POA: Insufficient documentation

## 2022-05-19 NOTE — Progress Notes (Unsigned)
Waiting on smart note

## 2022-05-20 ENCOUNTER — Encounter: Payer: Self-pay | Admitting: Dietician

## 2022-05-26 ENCOUNTER — Encounter: Payer: Self-pay | Admitting: Dietician

## 2022-05-26 ENCOUNTER — Encounter: Payer: PPO | Admitting: Dietician

## 2022-05-26 DIAGNOSIS — R7303 Prediabetes: Secondary | ICD-10-CM

## 2022-05-26 NOTE — Progress Notes (Signed)
On 05/26/2022 completed a post core session of the Diabetes Prevention Program with [facility].    Virtual Visit via Video Note   I connected with Kimberly Ryan by a video enabled application and verified that I am speaking with the correct person using two identifiers.   Location: Patient: home (virtual) Provider: office  By the end of this session patients are able to complete the following objectives:   Learning Objectives: List indoor physical activity options.  Identify any barriers to being active and brainstorm how to overcome barriers.  Describe short and long-term health benefits of physical activity.   Goals:  Record weight taken outside of class.  Track foods and beverages eaten each day in the "Food and Activity Tracker," including calories and fat grams for each item.   Track activity type, minutes you were active, and distance you reached each day in the "Food and Activity Tracker."   Follow-Up Plan: Attend next session.  Bring completed "Food and Activity Trackers" to next session to be reviewed by Lifestyle Coach.

## 2022-06-02 ENCOUNTER — Encounter: Payer: Self-pay | Admitting: Dietician

## 2022-06-02 ENCOUNTER — Encounter: Payer: PPO | Attending: Family Medicine | Admitting: Dietician

## 2022-06-02 DIAGNOSIS — R7303 Prediabetes: Secondary | ICD-10-CM | POA: Insufficient documentation

## 2022-06-02 NOTE — Progress Notes (Signed)
On 06/02/2022 patient completed a post core session of the Diabetes Prevention Program course virtually with [facility]. By the end of this session patients are able to complete the following objectives:   Virtual Visit via Video Note  I connected with Kimberly Ryan by a video enabled application and verified that I am speaking with the correct person using two identifiers.  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  Define the difference between HDL and LDL cholesterol List ways to reduce risk for heart disease.   Goals:  Record weight taken outside of class.  Track foods and beverages eaten each day in the "Food and Activity Tracker," including calories and fat grams for each item.   Track activity type, minutes you were active, and distance you reached each day in the "Food and Activity Tracker."   Follow-Up Plan: Attend next session.  Email completed "Food and Activity Trackers" before next session to be reviewed by Lifestyle Coach.

## 2022-06-09 ENCOUNTER — Encounter: Payer: PPO | Attending: Family Medicine | Admitting: Registered"

## 2022-06-09 DIAGNOSIS — R7303 Prediabetes: Secondary | ICD-10-CM | POA: Insufficient documentation

## 2022-06-20 ENCOUNTER — Encounter: Payer: Self-pay | Admitting: Registered"

## 2022-06-20 NOTE — Progress Notes (Signed)
On 06/09/22 patient completed Core Session 10 of Diabetes Prevention Program course virtually with [facility]. The following learning objectives were met by the patient during this class:   Virtual Visit via Video Note  I connected with Macayla Ekdahl by a video enabled application and verified that I am speaking with the correct person using two identifiers.  Location: Patient: Home.  Provider: Office.   Learning Objectives: List and describe the four keys for healthy eating out.  Give examples of how to apply these keys at the type of restaurants that the participants go to regularly.  Make an appropriate meal selection from a restaurant menu.  Demonstrate how to ask for a substitute item using assertive language and a polite tone of voice.    Goals:  Record weight taken outside of class.  Track foods and beverages eaten each day in the "Food and Activity Tracker," including calories and fat grams for each item.   Track activity type, minutes you were active, and distance you reached each day in the "Food and Activity Tracker."  Set aside one 20 to 30-minute block of time every day or find two or more periods of 10 to15 minutes each for physical activity.  Utilize positive action plan and complete questions on "To Do List."   Follow-Up Plan: Attend Core Session 11.  Email completed "Food and Activity Tracker" to be reviewed by Lifestyle Coach.

## 2022-06-27 ENCOUNTER — Encounter: Payer: Self-pay | Admitting: Registered"

## 2022-06-27 ENCOUNTER — Encounter: Payer: PPO | Attending: Family | Admitting: Registered"

## 2022-06-27 DIAGNOSIS — R7303 Prediabetes: Secondary | ICD-10-CM | POA: Diagnosis not present

## 2022-06-27 NOTE — Progress Notes (Signed)
On 06/27/22 patient completed Session 11 of Diabetes Prevention Program course virtually with [facility]. By the end of this session patients are able to complete the following objectives:   Virtual Visit via Video Note  I connected with Kimberly Ryan by a video enabled application and verified that I am speaking with the correct person using two identifiers.  Location: Patient: Home.  Provider: Office.   Learning Objectives: Give examples of negative thoughts that could prevent them from meeting their goals of losing weight and being more physically active.  Describe how to stop negative thoughts and talk back to them with positive thoughts.  Practice 1) stopping negative thoughts and 2) talking back to negative thoughts with positive ones.    Goals:  Record weight taken outside of class.  Track foods and beverages eaten each day in the "Food and Activity Tracker," including calories and fat grams for each item.   Track activity type, minutes you were active, and distance you reached each day in the "Food and Activity Tracker."  If you have any negative thoughts-write them in your Food and Activity Trackers, along with how you talked back to them. Practice stopping negative thoughts and talking back to them with positive thoughts.   Follow-Up Plan: Attend Core Session 12 next week.  Email completed "Food and Activity Tracker" before next week to be reviewed by Lifestyle Coach.

## 2022-06-30 ENCOUNTER — Encounter: Payer: PPO | Attending: Family Medicine | Admitting: Registered"

## 2022-06-30 DIAGNOSIS — R7303 Prediabetes: Secondary | ICD-10-CM | POA: Insufficient documentation

## 2022-07-01 DIAGNOSIS — Z1231 Encounter for screening mammogram for malignant neoplasm of breast: Secondary | ICD-10-CM | POA: Diagnosis not present

## 2022-07-02 DIAGNOSIS — M1711 Unilateral primary osteoarthritis, right knee: Secondary | ICD-10-CM | POA: Diagnosis not present

## 2022-07-03 ENCOUNTER — Encounter: Payer: Self-pay | Admitting: Registered"

## 2022-07-03 NOTE — Progress Notes (Signed)
Patient was seen on 06/30/22 for the Core Session 12 of Diabetes Prevention Program course at [facility]. By the end of this session patients are able to complete the following objectives:   Virtual Visit via Video Note  I connected with Zoi Devine by a video enabled application and verified that I am speaking with the correct person using two identifiers.  Location: Patient: Home.  Provider: Office.   Learning Objectives: Describe their current progress toward defined goals. Describe common causes for slipping from healthy eating or being active. Explain what to do to get back on their feet after a slip.  Goals:  Record weight taken outside of class.  Track foods and beverages eaten each day in the "Food and Activity Tracker," including calories and fat grams for each item.   Track activity type, minutes active, and distance reached each day in the "Food and Activity Tracker."  Try out the two action plans created during session- "Slips from Healthy Eating: Action Plan" and "Slips from Being Active: Action Plan" Answer questions on the handout.   Follow-Up Plan: Attend Core Session 13 next week.  Bring completed "Food and Activity Tracker" next week to be reviewed by Lifestyle Coach.

## 2022-07-07 ENCOUNTER — Encounter: Payer: Self-pay | Admitting: Registered"

## 2022-07-07 ENCOUNTER — Ambulatory Visit (HOSPITAL_BASED_OUTPATIENT_CLINIC_OR_DEPARTMENT_OTHER): Payer: PPO | Admitting: Registered"

## 2022-07-07 DIAGNOSIS — R7303 Prediabetes: Secondary | ICD-10-CM

## 2022-07-07 NOTE — Progress Notes (Addendum)
On 07/07/22 patient completed the Core Session 13 of Diabetes Prevention Program course virtually with [facility]. By the end of this session patients are able to complete the following objectives:   Virtual Visit via Video Note  I connected with Kimberly Ryan on 07/07/22 at 10:30 AM EDT by a video enabled application and verified that I am speaking with the correct person using two identifiers.  Location: Patient: Home.  Provider: Office.   Learning Objectives: Describe ways to add interest and variety to their activity plans. Define ?aerobic fitness. Explain the four F.I.T.T. principles (frequency, intensity, time, and type of activity) and how they relate to aerobic fitness.   Goals:  Record weight taken outside of class.  Track foods and beverages eaten each day in the "Food and Activity Tracker," including calories and fat grams for each item.   Track activity type, minutes you were active, and distance you reached each day in the "Food and Activity Tracker."  Do your best to reach activity goal for the week. Use one of the F.I.T.T. principles to jump start workouts. Document activity level on the "To Do Next Week" handout.  Follow-Up Plan: Attend Core Session 14 next week.  Email completed "Food and Activity Tracker" before next week to be reviewed by Lifestyle Coach.

## 2022-07-09 DIAGNOSIS — M1711 Unilateral primary osteoarthritis, right knee: Secondary | ICD-10-CM | POA: Diagnosis not present

## 2022-07-09 DIAGNOSIS — H40023 Open angle with borderline findings, high risk, bilateral: Secondary | ICD-10-CM | POA: Diagnosis not present

## 2022-07-14 ENCOUNTER — Encounter: Payer: PPO | Attending: Family Medicine | Admitting: Dietician

## 2022-07-14 ENCOUNTER — Encounter: Payer: Self-pay | Admitting: Dietician

## 2022-07-14 DIAGNOSIS — R7303 Prediabetes: Secondary | ICD-10-CM | POA: Insufficient documentation

## 2022-07-14 NOTE — Progress Notes (Signed)
On 07/04/2022 patient completed a post core session of the Diabetes Prevention Program course virtually with [facility]. By the end of this session patients are able to complete the following objectives:   Virtual Visit via Video Note  I connected with Rodena Piety L. Trumpler-Rich by a video enabled application and verified that I am speaking with the correct person using two identifiers.  Location: Patient: home (virtual) Provider: office  Learning Objectives: Describe the importance of having regular meals each day and how skipping meals can negatively affect food choices and weight.  Plan out balanced meals and snacks. List ways to avoid unplanned snacking.   Goals:  Record weight taken outside of class.  Track foods and beverages eaten each day in the "Food and Activity Tracker," including calories and fat grams for each item.   Track activity type, minutes you were active, and distance you reached each day in the "Food and Activity Tracker."   Follow-Up Plan: Attend next session.  Email completed "Food and Activity Trackers" before next session to be reviewed by Lifestyle Coach.

## 2022-07-16 DIAGNOSIS — M25561 Pain in right knee: Secondary | ICD-10-CM | POA: Diagnosis not present

## 2022-07-16 DIAGNOSIS — M1711 Unilateral primary osteoarthritis, right knee: Secondary | ICD-10-CM | POA: Diagnosis not present

## 2022-07-21 ENCOUNTER — Encounter: Payer: PPO | Attending: Family Medicine | Admitting: Registered"

## 2022-07-21 ENCOUNTER — Encounter: Payer: Self-pay | Admitting: Registered"

## 2022-07-21 DIAGNOSIS — R7303 Prediabetes: Secondary | ICD-10-CM | POA: Insufficient documentation

## 2022-07-21 NOTE — Progress Notes (Signed)
On 07/21/22 patient completed Core Session 14 of Diabetes Prevention Program course virtually with [facility]. By the end of this session patients are able to complete the following objectives:   Virtual Visit via Video Note  I connected with Kimberly Ryan on 07/21/22 at 10:30 AM EDT by a video enabled application and verified that I am speaking with the correct person using two identifiers.  Location: Patient: Home.  Provider: Office.  Learning Objectives: Give examples of problem social cues and helpful social cues.  Explain how to remove problem social cues and add helpful ones.  Describe ways of coping with vacations and social events such as parties, holidays, and visits from relatives and friends.  Create an action plan to change a problem social cue and add a helpful one.   Goals:  Record weight taken outside of class.  Track foods and beverages eaten each day in the "Food and Activity Tracker," including calories and fat grams for each item.   Track activity type, minutes you were active, and distance you reached each day in the "Food and Activity Tracker."  Do your best to reach activity goal for the week. Use action plan created during session to change a problem social cue and add a helpful social cue.  Answer questions regarding success of changing social cues on "To Do Next Week" handout.   Follow-Up Plan: Attend Core Session 15 next week.  Email completed "Food and Activity Tracker" before next week to be reviewed by Lifestyle Coach.

## 2022-07-28 ENCOUNTER — Encounter: Payer: Self-pay | Admitting: Dietician

## 2022-07-28 ENCOUNTER — Encounter: Payer: PPO | Attending: Family Medicine | Admitting: Dietician

## 2022-07-28 DIAGNOSIS — R7303 Prediabetes: Secondary | ICD-10-CM | POA: Diagnosis not present

## 2022-07-28 NOTE — Progress Notes (Signed)
On 07/28/2022 patient completed Core Session 15 of Diabetes Prevention Program course virtually with [facility]. By the end of this session patients are able to complete the following objectives:   Virtual Visit via Video Note  I connected with Kimberly Ryan on 07/28/22 at 10:30 AM EDT by a video enabled application and verified that I am speaking with the correct person using two identifiers.  Location: Patient: home (virtual) Provider: office  Learning Objectives: Explain how to prevent stress or cope with unavoidable stress.  Describe how this program can be a source of stress.  Explain how to manage stressful situations.  Create and follow an action plan for either preventing or coping with a stressful situation.   Goals:  Record weight taken outside of class.  Track foods and beverages eaten each day in the "Food and Activity Tracker," including calories and fat grams for each item.   Track activity type, minutes you were active, and distance you reached each day in the "Food and Activity Tracker."  Do your best to reach activity goal for the week. Follow your action plan to reduce stress.  Answer questions on handout regarding success of action plan.   Follow-Up Plan: Attend Core Session 16 next week.  Email completed "Food and Activity Tracker" before next week to be reviewed by Lifestyle Coach.

## 2022-07-29 DIAGNOSIS — M810 Age-related osteoporosis without current pathological fracture: Secondary | ICD-10-CM | POA: Diagnosis not present

## 2022-08-15 DIAGNOSIS — M1712 Unilateral primary osteoarthritis, left knee: Secondary | ICD-10-CM | POA: Diagnosis not present

## 2022-08-21 DIAGNOSIS — M1712 Unilateral primary osteoarthritis, left knee: Secondary | ICD-10-CM | POA: Diagnosis not present

## 2022-08-22 DIAGNOSIS — J309 Allergic rhinitis, unspecified: Secondary | ICD-10-CM | POA: Diagnosis not present

## 2022-08-22 DIAGNOSIS — K219 Gastro-esophageal reflux disease without esophagitis: Secondary | ICD-10-CM | POA: Diagnosis not present

## 2022-08-22 DIAGNOSIS — R32 Unspecified urinary incontinence: Secondary | ICD-10-CM | POA: Diagnosis not present

## 2022-08-22 DIAGNOSIS — G4700 Insomnia, unspecified: Secondary | ICD-10-CM | POA: Diagnosis not present

## 2022-08-22 DIAGNOSIS — H3530 Unspecified macular degeneration: Secondary | ICD-10-CM | POA: Diagnosis not present

## 2022-08-22 DIAGNOSIS — Z87891 Personal history of nicotine dependence: Secondary | ICD-10-CM | POA: Diagnosis not present

## 2022-08-22 DIAGNOSIS — M8580 Other specified disorders of bone density and structure, unspecified site: Secondary | ICD-10-CM | POA: Diagnosis not present

## 2022-08-22 DIAGNOSIS — F419 Anxiety disorder, unspecified: Secondary | ICD-10-CM | POA: Diagnosis not present

## 2022-08-22 DIAGNOSIS — E039 Hypothyroidism, unspecified: Secondary | ICD-10-CM | POA: Diagnosis not present

## 2022-08-22 DIAGNOSIS — H259 Unspecified age-related cataract: Secondary | ICD-10-CM | POA: Diagnosis not present

## 2022-08-22 DIAGNOSIS — E663 Overweight: Secondary | ICD-10-CM | POA: Diagnosis not present

## 2022-08-22 DIAGNOSIS — E890 Postprocedural hypothyroidism: Secondary | ICD-10-CM | POA: Diagnosis not present

## 2022-08-28 DIAGNOSIS — M1712 Unilateral primary osteoarthritis, left knee: Secondary | ICD-10-CM | POA: Diagnosis not present

## 2022-09-01 ENCOUNTER — Encounter: Payer: PPO | Attending: Family Medicine | Admitting: Dietician

## 2022-09-01 ENCOUNTER — Encounter: Payer: Self-pay | Admitting: Dietician

## 2022-09-01 DIAGNOSIS — R7303 Prediabetes: Secondary | ICD-10-CM | POA: Insufficient documentation

## 2022-09-01 NOTE — Progress Notes (Signed)
On 09/01/2022 patient completed a post core session of the Diabetes Prevention Program course virtually with [facility]. By the end of this session patients are able to complete the following objectives:   Virtual Visit via Video Note  I connected with Kimberly Ryan by a video enabled application and verified that I am speaking with the correct person using two identifiers.  Location: Patient: home (virtual) Provider: office  Learning Objectives: Identify how to maintain and/or continue working toward program goals for the remainder of the program.  Describe ways that food and activity tracking can assist them in maintaining/reaching program goals.  Identify progress they have made since the beginning of the program.   Goals:  Record weight taken outside of class.  Track foods and beverages eaten each day in the "Food and Activity Tracker," including calories and fat grams for each item.   Track activity type, minutes you were active, and distance you reached each day in the "Food and Activity Tracker."   Follow-Up Plan: Attend session 18 in two weeks.  Email completed "Food and Activity Trackers" before next session to be reviewed by Lifestyle Coach.

## 2022-09-05 ENCOUNTER — Encounter (HOSPITAL_BASED_OUTPATIENT_CLINIC_OR_DEPARTMENT_OTHER): Payer: PPO | Admitting: Dietician

## 2022-09-05 ENCOUNTER — Encounter: Payer: Self-pay | Admitting: Dietician

## 2022-09-05 DIAGNOSIS — R7303 Prediabetes: Secondary | ICD-10-CM | POA: Diagnosis not present

## 2022-09-05 NOTE — Progress Notes (Signed)
On 09/05/2022 patient completed Core Session 16 of Diabetes Prevention Program course virtually with [facility]. By the end of this session patients are able to complete the following objectives:   Virtual Visit via Video Note  I connected with Kimberly Ryan by a video enabled application and verified that I am speaking with the correct person using two identifiers.  Location: Patient: home (virtual) Provider: office  Learning Objectives: Measure their progress toward weight and physical activity goals since Session 1.  Develop a plan for improving progress, if their goals have not yet been attained.  Describe ways to stay motivated long-term.   Goals:  Record weight taken outside of class.  Track foods and beverages eaten each day in the "Food and Activity Tracker," including calories and fat grams for each item.   Track activity type, minutes you were active, and distance you reached each day in the "Food and Activity Tracker."  Utilize action plan to help stay motivated and complete questions on "To Do List."   Follow-Up Plan: Attend session 17 in two weeks.  Email completed "Food and Activity Tracker" before next session to be reviewed by Lifestyle Coach.

## 2022-09-08 DIAGNOSIS — Z0000 Encounter for general adult medical examination without abnormal findings: Secondary | ICD-10-CM | POA: Diagnosis not present

## 2022-09-08 DIAGNOSIS — Z6830 Body mass index (BMI) 30.0-30.9, adult: Secondary | ICD-10-CM | POA: Diagnosis not present

## 2022-09-08 DIAGNOSIS — E559 Vitamin D deficiency, unspecified: Secondary | ICD-10-CM | POA: Diagnosis not present

## 2022-09-08 DIAGNOSIS — E785 Hyperlipidemia, unspecified: Secondary | ICD-10-CM | POA: Diagnosis not present

## 2022-09-08 DIAGNOSIS — G479 Sleep disorder, unspecified: Secondary | ICD-10-CM | POA: Diagnosis not present

## 2022-09-08 DIAGNOSIS — R7303 Prediabetes: Secondary | ICD-10-CM | POA: Diagnosis not present

## 2022-09-08 DIAGNOSIS — E039 Hypothyroidism, unspecified: Secondary | ICD-10-CM | POA: Diagnosis not present

## 2022-09-09 DIAGNOSIS — R7303 Prediabetes: Secondary | ICD-10-CM | POA: Diagnosis not present

## 2022-09-09 DIAGNOSIS — Z0000 Encounter for general adult medical examination without abnormal findings: Secondary | ICD-10-CM | POA: Diagnosis not present

## 2022-09-09 DIAGNOSIS — E559 Vitamin D deficiency, unspecified: Secondary | ICD-10-CM | POA: Diagnosis not present

## 2022-09-09 DIAGNOSIS — E785 Hyperlipidemia, unspecified: Secondary | ICD-10-CM | POA: Diagnosis not present

## 2022-09-09 DIAGNOSIS — E039 Hypothyroidism, unspecified: Secondary | ICD-10-CM | POA: Diagnosis not present

## 2022-09-29 ENCOUNTER — Encounter: Payer: PPO | Attending: Family Medicine | Admitting: Dietician

## 2022-09-29 ENCOUNTER — Encounter: Payer: Self-pay | Admitting: Dietician

## 2022-09-29 DIAGNOSIS — R7303 Prediabetes: Secondary | ICD-10-CM | POA: Insufficient documentation

## 2022-09-29 NOTE — Progress Notes (Signed)
On 09/29/2022 patient completed a post core session of Diabetes Prevention Program course virtually with [facility]. By the end of this session patients are able to complete the following objectives:   Virtual Visit via Video Note  I connected with Kimberly Ryan a video enabled application and verified that I am speaking with the correct person using two identifiers.   I discussed the limitations of evaluation and management by telemedicine and the availability of in person appointments. The patient expressed understanding and agreed to proceed.  Location: Patient: home (virtual) Provider: office  Learning Objectives: Explain how glucose is used in the body and it's relationship with insulin/insulin resistance.  Identify symptoms of diabetes.  Describe lab tests used to diagnose diabetes.  Describe health complications and conditions related to diabetes.   Goals:  Record weight taken outside of class.  Track foods and beverages eaten each day in the "Food and Activity Tracker," including calories and fat grams for each item.   Track activity type, minutes you were active, and distance you reached each day in the "Food and Activity Tracker."   Follow-Up Plan: Attend next session.  Email completed "Food and Activity Trackers" before next session to be reviewed by Lifestyle Coach.

## 2022-11-03 ENCOUNTER — Encounter: Payer: Self-pay | Admitting: Dietician

## 2022-11-03 ENCOUNTER — Encounter: Payer: PPO | Attending: Family Medicine | Admitting: Dietician

## 2022-11-03 DIAGNOSIS — R7303 Prediabetes: Secondary | ICD-10-CM | POA: Insufficient documentation

## 2022-11-03 NOTE — Progress Notes (Signed)
On 11/03/2022 patient completed a post core session of the Diabetes Prevention Program course virtually with [facility]. By the end of this session patients are able to complete the following objectives:   Virtual Visit via Video Note  I connected with Kimberly Ryan by a video enabled application and verified that I am speaking with the correct person using two identifiers.  Location: Patient: home (virtual) Provider: office  Learning Objectives: List 9 ways to to stay on track during special events/occasions. Create a plan to avoid a food problem at an upcoming special event/occasion Describe ways to make time for healthy behaviors during special events/occasions   Goals:  Record weight taken outside of class.  Track foods and beverages eaten each day in the "Food and Activity Tracker," including calories and fat grams for each item.   Track activity type, minutes you were active, and distance you reached each day in the "Food and Activity Tracker."   Follow-Up Plan: Attend next session.  Email completed "Food and Activity Trackers" before next session to be reviewed by Lifestyle Coach.

## 2022-11-27 DIAGNOSIS — L821 Other seborrheic keratosis: Secondary | ICD-10-CM | POA: Diagnosis not present

## 2022-11-27 DIAGNOSIS — N904 Leukoplakia of vulva: Secondary | ICD-10-CM | POA: Diagnosis not present

## 2022-11-27 DIAGNOSIS — L920 Granuloma annulare: Secondary | ICD-10-CM | POA: Diagnosis not present

## 2022-11-27 DIAGNOSIS — D2221 Melanocytic nevi of right ear and external auricular canal: Secondary | ICD-10-CM | POA: Diagnosis not present

## 2022-11-27 DIAGNOSIS — D239 Other benign neoplasm of skin, unspecified: Secondary | ICD-10-CM | POA: Diagnosis not present

## 2022-11-27 DIAGNOSIS — Z808 Family history of malignant neoplasm of other organs or systems: Secondary | ICD-10-CM | POA: Diagnosis not present

## 2022-11-27 DIAGNOSIS — L578 Other skin changes due to chronic exposure to nonionizing radiation: Secondary | ICD-10-CM | POA: Diagnosis not present

## 2022-11-27 DIAGNOSIS — L309 Dermatitis, unspecified: Secondary | ICD-10-CM | POA: Diagnosis not present

## 2022-11-27 DIAGNOSIS — D225 Melanocytic nevi of trunk: Secondary | ICD-10-CM | POA: Diagnosis not present

## 2022-12-01 ENCOUNTER — Encounter: Payer: Self-pay | Admitting: Dietician

## 2022-12-01 ENCOUNTER — Encounter: Payer: PPO | Attending: Family Medicine | Admitting: Dietician

## 2022-12-01 DIAGNOSIS — R7303 Prediabetes: Secondary | ICD-10-CM | POA: Insufficient documentation

## 2022-12-01 NOTE — Progress Notes (Signed)
On 12/01/2022 patient completed a post core session of the Diabetes Prevention Program course virtually with [facility]. By the end of this session patients are able to complete the following objectives:   Virtual Visit via Video Note  I connected with Kimberly Ryan  by a video enabled application and verified that I am speaking with the correct person using two identifiers.  Location: Patient: home (virtual) Provider: office  Learning Objectives: List ways to make recipes healthier.  List lower-fat and lower-calorie substitutions for common ingredients.  Identify low-fat cooking methods.  Describe how to choose a cookbook that works best for their needs.   Goals:  Record weight taken outside of class.  Track foods and beverages eaten each day in the "Food and Activity Tracker," including calories and fat grams for each item.   Track activity type, minutes you were active, and distance you reached each day in the "Food and Activity Tracker."   Follow-Up Plan: Attend next session.  Email completed "Food and Activity Trackers" before next session to be reviewed by Lifestyle Coach.

## 2022-12-29 ENCOUNTER — Encounter: Payer: PPO | Attending: Family Medicine | Admitting: Dietician

## 2022-12-29 ENCOUNTER — Encounter: Payer: Self-pay | Admitting: Dietician

## 2022-12-29 DIAGNOSIS — R7303 Prediabetes: Secondary | ICD-10-CM

## 2022-12-29 NOTE — Progress Notes (Signed)
On 12/29/2022 patient completed a post core session of the Diabetes Prevention Program course virtually with [facility]. By the end of this session patients are able to complete the following objectives:   Virtual Visit via Video Note  I connected with Kimberly Ryan by a video enabled application and verified that I am speaking with the correct person using two identifiers.  Location: Patient: home (virtual) Provider: office  Learning Objectives: Describe how to incorporate more fruits and vegetables into meals. List criteria for selecting good quality fruits and vegetables at the store.  Define mindful eating. List the benefits of eating mindfully.   Goals:  Record weight taken outside of class.  Track foods and beverages eaten each day in the "Food and Activity Tracker," including calories and fat grams for each item.   Track activity type, minutes you were active, and distance you reached each day in the "Food and Activity Tracker."   Follow-Up Plan: Attend next session.  Email completed "Food and Activity Trackers" before next session to be reviewed by Lifestyle Coach.

## 2023-01-06 ENCOUNTER — Other Ambulatory Visit: Payer: Self-pay

## 2023-01-06 DIAGNOSIS — M810 Age-related osteoporosis without current pathological fracture: Secondary | ICD-10-CM | POA: Insufficient documentation

## 2023-01-07 DIAGNOSIS — H40023 Open angle with borderline findings, high risk, bilateral: Secondary | ICD-10-CM | POA: Diagnosis not present

## 2023-01-08 ENCOUNTER — Telehealth: Payer: Self-pay | Admitting: Pharmacy Technician

## 2023-01-08 NOTE — Telephone Encounter (Addendum)
Auth Submission: NO AUTH NEEDED Payer: [employer] Medication & CPT/J Code(s) submitted: Prolia (Denosumab) [id number] Route of submission (phone, fax, portal):  Phone #[phone] opt-3 Fax # [phone] Auth type: Buy/Bill Units/visits requested: x2 Reference number: [id number] Approval from: 01/07/23 to 01/14/25

## 2023-01-09 DIAGNOSIS — L920 Granuloma annulare: Secondary | ICD-10-CM | POA: Diagnosis not present

## 2023-01-20 ENCOUNTER — Ambulatory Visit (INDEPENDENT_AMBULATORY_CARE_PROVIDER_SITE_OTHER): Payer: PPO

## 2023-01-20 VITALS — BP 117/76 | HR 67 | Temp 98.1°F | Resp 16 | Ht 66.0 in | Wt 193.6 lb

## 2023-01-20 DIAGNOSIS — M810 Age-related osteoporosis without current pathological fracture: Secondary | ICD-10-CM | POA: Diagnosis not present

## 2023-01-20 MED ORDER — DENOSUMAB 60 MG/ML ~~LOC~~ SOSY
60.0000 mg | PREFILLED_SYRINGE | Freq: Once | SUBCUTANEOUS | Status: AC
  Administered 2023-01-20: 60 mg via SUBCUTANEOUS
  Filled 2023-01-20: qty 1

## 2023-01-20 NOTE — Progress Notes (Signed)
Diagnosis: Injection  Provider:  Marshell Garfinkel MD  Procedure: Infusion  Prolia (Denosumab), Dose: 60 mg, Site: subcutaneous, Number of injections: 1  Post Care:  n/a   Discharge: Condition: Good, Destination: Home . AVS provided to patient.   Performed by:  Adelina Mings, LPN

## 2023-02-02 ENCOUNTER — Encounter: Payer: Self-pay | Admitting: Dietician

## 2023-02-02 ENCOUNTER — Encounter: Payer: PPO | Attending: Family Medicine | Admitting: Dietician

## 2023-02-02 DIAGNOSIS — R7303 Prediabetes: Secondary | ICD-10-CM | POA: Insufficient documentation

## 2023-02-02 NOTE — Progress Notes (Signed)
On 02/02/2023 pt completed a post core session of the Diabetes Prevention Program course virtually with [facility]. By the end of this session patients are able to complete the following objectives:   Virtual Visit via Video Note  I connected with Rodena Piety L. Trumpler-Rich  by a video enabled application and verified that I am speaking with the correct person using two identifiers.  Location: Patient: home (virtual) Provider: office  Learning Objectives: Reflect on lifestyle changes they have made since starting the DPP.  Set long-term goals to promote continued maintenance of lifestyle changes made during the program.   Goals:  Work toward reaching new long-term goals set during class.   Follow-Up Plan: Contact Lifestyle Coach with questions/concerns PRN.

## 2023-02-27 DIAGNOSIS — K131 Cheek and lip biting: Secondary | ICD-10-CM | POA: Diagnosis not present

## 2023-03-05 DIAGNOSIS — F419 Anxiety disorder, unspecified: Secondary | ICD-10-CM | POA: Diagnosis not present

## 2023-03-05 DIAGNOSIS — G479 Sleep disorder, unspecified: Secondary | ICD-10-CM | POA: Diagnosis not present

## 2023-03-05 DIAGNOSIS — E559 Vitamin D deficiency, unspecified: Secondary | ICD-10-CM | POA: Diagnosis not present

## 2023-03-05 DIAGNOSIS — R7303 Prediabetes: Secondary | ICD-10-CM | POA: Diagnosis not present

## 2023-03-05 DIAGNOSIS — Z6830 Body mass index (BMI) 30.0-30.9, adult: Secondary | ICD-10-CM | POA: Diagnosis not present

## 2023-03-05 DIAGNOSIS — K1379 Other lesions of oral mucosa: Secondary | ICD-10-CM | POA: Diagnosis not present

## 2023-03-05 DIAGNOSIS — K219 Gastro-esophageal reflux disease without esophagitis: Secondary | ICD-10-CM | POA: Diagnosis not present

## 2023-03-05 DIAGNOSIS — E039 Hypothyroidism, unspecified: Secondary | ICD-10-CM | POA: Diagnosis not present

## 2023-04-24 DIAGNOSIS — G479 Sleep disorder, unspecified: Secondary | ICD-10-CM | POA: Diagnosis not present

## 2023-04-24 DIAGNOSIS — J209 Acute bronchitis, unspecified: Secondary | ICD-10-CM | POA: Diagnosis not present

## 2023-04-24 DIAGNOSIS — Z6830 Body mass index (BMI) 30.0-30.9, adult: Secondary | ICD-10-CM | POA: Diagnosis not present

## 2023-07-07 DIAGNOSIS — Z1231 Encounter for screening mammogram for malignant neoplasm of breast: Secondary | ICD-10-CM | POA: Diagnosis not present

## 2023-07-07 DIAGNOSIS — N958 Other specified menopausal and perimenopausal disorders: Secondary | ICD-10-CM | POA: Diagnosis not present

## 2023-07-07 DIAGNOSIS — M8588 Other specified disorders of bone density and structure, other site: Secondary | ICD-10-CM | POA: Diagnosis not present

## 2023-07-08 DIAGNOSIS — H40023 Open angle with borderline findings, high risk, bilateral: Secondary | ICD-10-CM | POA: Diagnosis not present

## 2023-07-16 DIAGNOSIS — G8929 Other chronic pain: Secondary | ICD-10-CM | POA: Diagnosis not present

## 2023-07-16 DIAGNOSIS — G4700 Insomnia, unspecified: Secondary | ICD-10-CM | POA: Diagnosis not present

## 2023-07-16 DIAGNOSIS — E669 Obesity, unspecified: Secondary | ICD-10-CM | POA: Diagnosis not present

## 2023-07-16 DIAGNOSIS — E785 Hyperlipidemia, unspecified: Secondary | ICD-10-CM | POA: Diagnosis not present

## 2023-07-16 DIAGNOSIS — H259 Unspecified age-related cataract: Secondary | ICD-10-CM | POA: Diagnosis not present

## 2023-07-16 DIAGNOSIS — E890 Postprocedural hypothyroidism: Secondary | ICD-10-CM | POA: Diagnosis not present

## 2023-07-16 DIAGNOSIS — E039 Hypothyroidism, unspecified: Secondary | ICD-10-CM | POA: Diagnosis not present

## 2023-07-16 DIAGNOSIS — H3530 Unspecified macular degeneration: Secondary | ICD-10-CM | POA: Diagnosis not present

## 2023-07-16 DIAGNOSIS — M8580 Other specified disorders of bone density and structure, unspecified site: Secondary | ICD-10-CM | POA: Diagnosis not present

## 2023-07-16 DIAGNOSIS — J309 Allergic rhinitis, unspecified: Secondary | ICD-10-CM | POA: Diagnosis not present

## 2023-07-16 DIAGNOSIS — F3342 Major depressive disorder, recurrent, in full remission: Secondary | ICD-10-CM | POA: Diagnosis not present

## 2023-07-16 DIAGNOSIS — K219 Gastro-esophageal reflux disease without esophagitis: Secondary | ICD-10-CM | POA: Diagnosis not present

## 2023-07-17 DIAGNOSIS — M25531 Pain in right wrist: Secondary | ICD-10-CM | POA: Diagnosis not present

## 2023-07-17 DIAGNOSIS — Z6831 Body mass index (BMI) 31.0-31.9, adult: Secondary | ICD-10-CM | POA: Diagnosis not present

## 2023-07-17 DIAGNOSIS — K1379 Other lesions of oral mucosa: Secondary | ICD-10-CM | POA: Diagnosis not present

## 2023-07-22 ENCOUNTER — Ambulatory Visit: Payer: PPO

## 2023-07-22 MED ORDER — DENOSUMAB 60 MG/ML ~~LOC~~ SOSY: 60.0000 mg | PREFILLED_SYRINGE | Freq: Once | SUBCUTANEOUS | Status: DC

## 2023-07-27 DIAGNOSIS — M810 Age-related osteoporosis without current pathological fracture: Secondary | ICD-10-CM | POA: Diagnosis not present

## 2023-07-27 DIAGNOSIS — E559 Vitamin D deficiency, unspecified: Secondary | ICD-10-CM | POA: Diagnosis not present

## 2023-07-27 DIAGNOSIS — E890 Postprocedural hypothyroidism: Secondary | ICD-10-CM | POA: Diagnosis not present

## 2023-08-04 ENCOUNTER — Ambulatory Visit: Payer: PPO

## 2023-08-04 ENCOUNTER — Ambulatory Visit: Payer: PPO | Attending: Family Medicine | Admitting: Physical Therapy

## 2023-08-04 ENCOUNTER — Other Ambulatory Visit: Payer: Self-pay

## 2023-08-04 DIAGNOSIS — M79644 Pain in right finger(s): Secondary | ICD-10-CM | POA: Insufficient documentation

## 2023-08-04 DIAGNOSIS — M25531 Pain in right wrist: Secondary | ICD-10-CM | POA: Insufficient documentation

## 2023-08-04 DIAGNOSIS — M6281 Muscle weakness (generalized): Secondary | ICD-10-CM | POA: Diagnosis not present

## 2023-08-04 NOTE — Therapy (Signed)
OUTPATIENT PHYSICAL THERAPY UPPER EXTREMITY EVALUATION   Patient Name: Kimberly Ryan MRN: [id number] DOB:1951/01/13, 72 y.o., female Today's Date: 08/04/2023  END OF SESSION:  PT End of Session - 08/04/23 1454     Visit Number 1    Date for PT Re-Evaluation 09/29/23    Authorization Type [employer]    Progress Note Due on Visit 10    PT Start Time 1450    PT Stop Time 1530    PT Time Calculation (min) 40 min    Activity Tolerance Patient tolerated treatment well             Past Medical History:  Diagnosis Date   Anxiety    Arthritis    GERD (gastroesophageal reflux disease)    Headache(784.0)    Shortness of breath    with exertion    Thyroid disease    Past Surgical History:  Procedure Laterality Date   MYOMECTOMY  2000   RHINOPLASTY  1953   THYROID LOBECTOMY  08/17/2012   Procedure: THYROID LOBECTOMY;  Surgeon: Atilano Ina, MD,FACS;  Location: [facility];  Service: General;  Laterality: Left;  Left Thyroid Lobectomy   TONSILLECTOMY  1954   Patient Active Problem List   Diagnosis Date Noted   OP (osteoporosis) 01/06/2023   Postsurgical hypothyroidism 11/04/2012   Multiple thyroid nodules 12/19/2011    PCP: Inez Pilgrim NP  REFERRING PROVIDER: Inez Pilgrim NP  REFERRING DIAG: M25.531 Wrist pain, right   THERAPY DIAG:  Right wrist pain  Rationale for Evaluation and Treatment: Rehabilitation  ONSET DATE: 6 weeks ago  SUBJECTIVE:                                                                                                                                                                                      SUBJECTIVE STATEMENT: Long history of right 4th finger pain/OA then about 6 weeks ago began having right radial wrist pain for no apparent reason.  Denies trauma. Hand dominance: Right  PERTINENT HISTORY: Pt is a Production manager Had PT in past for hand/finger tendonitis with Loistine Simas History many years ago  of right wrist fracture (no hardware) Osteopenia (bone density test 3 weeks ago improved on 1 hip, worse on the other) Glucosamine/chondroitin Tried previous HEP 1# wrist ex's but seemed to worsen  PAIN:  PAIN:  Are you having pain? Yes NPRS scale: 3-4/10 Pain location: intermittent right pain 4th finger and radial wrist Aggravating factors: AM stiffness;  playing the piano; wrist radial deviation, pronation; lifting a cup to the microwave Relieving factors: grape juice with pectin ([facility]); ibuprofen  PRECAUTIONS: None     WEIGHT  BEARING RESTRICTIONS: No  FALLS:  Has patient fallen in last 6 months? No  OCCUPATION: Production manager  PLOF: Independent  PATIENT GOALS: strengthening; easing of the pain   OBJECTIVE:   PATIENT SURVEYS :  FOTO 65%  COGNITION: Overall cognitive status: Within functional limits for tasks assessed      The Patient-Specific Functional Scale  Initial:  I am going to ask you to identify up to 3 important activities that you are unable to do or are having difficulty with as a result of this problem.  Today are there any activities that you are unable to do or having difficulty with because of this?  (Patient shown scale and patient rated each activity)  Follow up: When you first came in you had difficulty performing these activities.  Today do you still have difficulty?  Patient-Specific activity scoring scheme (Point to one number):  0 1 2 3 4 5 6 7 8 9  10 Unable                                                                                                          Able to perform To perform                                                                                                    activity at the same Activity         Level as before                                                                                                                       Injury or problem  Activity           Playing piano                                                                       Initial:  7               2.               Cooking, putting something in microwave/twisting wrist              Initial:    7                                      APPEARANCE: minimal edema or bony enlargement  UPPER EXTREMITY ROM:  Decreased DIP and PIP flexion on right   Active ROM Right eval Left eval  Shoulder flexion  full  Shoulder extension    Shoulder abduction  full  Shoulder adduction    Shoulder internal rotation  full  Shoulder external rotation  full  Elbow flexion  full  Elbow extension 0 0  Wrist flexion 58 66  Wrist extension 45 65  Wrist ulnar deviation 20 28  Wrist radial deviation 18 22  Wrist pronation    Wrist supination 80 105  (Blank rows = not tested)  UPPER EXTREMITY MMT:  MMT Right eval Left eval  Shoulder flexion    Shoulder extension    Shoulder abduction    Shoulder adduction    Shoulder internal rotation    Shoulder external rotation    Middle trapezius    Lower trapezius    Elbow flexion 5 5  Elbow extension 5 5  Wrist flexion 4- 5  Wrist extension 4- 5  Wrist ulnar deviation 4- 5  Wrist radial deviation 4- 5  Wrist pronation 4- 5  Wrist supination 4-  5  Grip strength (lbs) 19 35  (Blank rows = not tested) Pincher strength thumb and index:  right 5#; left 6.5# Pincher strength thumb and 4th finger:  rigth 2.5#; left 6# Decreased right hand intrinsic strength 4-/5; finger adductors 3/5   TODAY'S TREATMENT:                                                                                                                                         DATE: 8/20 Discussed paraffin baths Light Theraputty  PATIENT EDUCATION: Education details: Educated patient on anatomy and physiology of current symptoms, prognosis, plan of care as well as initial self care strategies to promote recovery Person educated: Patient Education method: Explanation Education comprehension:  verbalized understanding  HOME EXERCISE PROGRAM: Access Code: [email or web address] URL: [email or web address] Date: 08/04/2023 Prepared by: Lavinia Sharps  Exercises - Finger Abduction with Rubber Band  - 1 x daily - 7 x weekly - 2 sets - 10 reps - Seated Finger Flexion with Resistance  - 1 x daily - 7 x weekly - 2 sets - 10 reps - Seated Finger Adduction and Extension with Towel  - 1 x daily - 7 x weekly - 2 sets -  10 reps  ASSESSMENT:  CLINICAL IMPRESSION: Patient is a 72 y.o. female who was seen today for physical therapy evaluation and treatment for right wrist pain and right 4th finger pain and stiffness.  The patient plays the piano and organ professionally and this issue has been affecting her ability to play.  She is also having difficulty with lifting a cup into the microwave or twisting motions.  She has limited wrist ROM in all planes as well as strength deficits.  Decreased grip strength as well as pincher strength on right.  She would benefit from skilled PT to address these deficits.     OBJECTIVE IMPAIRMENTS: decreased activity tolerance, decreased ROM, decreased strength, impaired perceived functional ability, impaired UE functional use, and pain.   ACTIVITY LIMITATIONS: carrying, lifting, and playing the piano professionally  PARTICIPATION LIMITATIONS: meal prep and occupation  PERSONAL FACTORS: previous history of tendontitis are also affecting patient's functional outcome.   REHAB POTENTIAL: Good  CLINICAL DECISION MAKING: Stable/uncomplicated  EVALUATION COMPLEXITY: Low  GOALS: Goals reviewed with patient? Yes  SHORT TERM GOALS: Target date: 09/01/2023   The patient will demonstrate knowledge of basic self care strategies and exercises to promote healing  Baseline: Goal status: INITIAL  2.  Right wrist strength improved to 4/5 needed for lifting and carrying objects in the kitchen Baseline:  Goal status: INITIAL  3.  Improved wrist ROM: flexion to 64  degrees, extension to 50 degrees needed for playing the piano Baseline:  Goal status: INITIAL  4.  The patient will have grip and finger strength improved to lift a 2# mug in/out of the microwave Baseline:  Goal status: INITIAL   LONG TERM GOALS: Target date: 09/29/2023   The patient will be independent in a safe self progression of a home exercise program to promote further recovery of function   Baseline:  Goal status: INITIAL  2.  The patient will report an improvement in pain hand/wrist mobility needed for playing the piano with PSFS rating improved to 9 Baseline:  Goal status: INITIAL  3.  The patient will report an improvement with cooking including twisting and lifting a cup to the microwave with PSFS rating improved to 9 Baseline:  Goal status: INITIAL  4.  Right grip strength improved to 29# and pincher strength improved to 6# Baseline:  Goal status: INITIAL  5.  The patient will have improved FOTO score to    72%   indicating improved function with less pain  Baseline:  Goal status: INITIAL   PLAN: PT FREQUENCY: 1-2x/week  PT DURATION: 8 weeks  PLANNED INTERVENTIONS: Therapeutic exercises, Therapeutic activity, Neuromuscular re-education, Patient/Family education, Self Care, Joint mobilization, Dry Needling, Electrical stimulation, Cryotherapy, Moist heat, Taping, Ultrasound, Ionotophoresis 4mg /ml Dexamethasone, Manual therapy, and Re-evaluation  PLAN FOR NEXT SESSION: instruct in Theraputty ex's if pt has for home; review finger instrinsic initial HEP; try band wrist flex/pronation and extension/supination ex's  Lavinia Sharps, PT 08/04/23 10:37 PM Phone: [phone] Fax: [phone]

## 2023-08-04 NOTE — Patient Instructions (Signed)
   Paraffin Wax  30$  [employer], [employer],  [employer]

## 2023-08-12 ENCOUNTER — Ambulatory Visit: Payer: PPO | Admitting: Physical Therapy

## 2023-08-13 ENCOUNTER — Ambulatory Visit: Payer: PPO | Admitting: Physical Therapy

## 2023-08-13 DIAGNOSIS — M79644 Pain in right finger(s): Secondary | ICD-10-CM

## 2023-08-13 DIAGNOSIS — M25531 Pain in right wrist: Secondary | ICD-10-CM

## 2023-08-13 DIAGNOSIS — M6281 Muscle weakness (generalized): Secondary | ICD-10-CM

## 2023-08-13 NOTE — Therapy (Signed)
OUTPATIENT PHYSICAL THERAPY UPPER EXTREMITY PROGRESS NOTE   Patient Name: Kimberly Ryan MRN: [id number] DOB:[birth year], 72 y.o., female Today's Date: 08/13/2023  END OF SESSION:  PT End of Session - 08/13/23 1406     Visit Number 2    Progress Note Due on Visit 10    PT Start Time 1406    PT Stop Time 1444    PT Time Calculation (min) 38 min    Activity Tolerance Patient tolerated treatment well             Past Medical History:  Diagnosis Date   Anxiety    Arthritis    GERD (gastroesophageal reflux disease)    Headache(784.0)    Shortness of breath    with exertion    Thyroid disease    Past Surgical History:  Procedure Laterality Date   MYOMECTOMY  2000   RHINOPLASTY  1953   THYROID LOBECTOMY  08/17/2012   Procedure: THYROID LOBECTOMY;  Surgeon: Atilano Ina, MD,FACS;  Location: [facility];  Service: General;  Laterality: Left;  Left Thyroid Lobectomy   TONSILLECTOMY  1954   Patient Active Problem List   Diagnosis Date Noted   OP (osteoporosis) 01/06/2023   Postsurgical hypothyroidism 11/04/2012   Multiple thyroid nodules 12/19/2011    PCP: Inez Pilgrim NP  REFERRING PROVIDER: Inez Pilgrim NP  REFERRING DIAG: M25.531 Wrist pain, right   THERAPY DIAG:  Right wrist pain  Rationale for Evaluation and Treatment: Rehabilitation  ONSET DATE: 6 weeks ago  SUBJECTIVE:                                                                                                                                                                                      SUBJECTIVE STATEMENT: Wrist hurt after playing piano at choir practice last night.  Certain movements like clicking/pushing seat belt will bother and lifting coffee mug out of microwave is painful. 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 Not painful at the moment NPRS scale: 0/10 Pain location: intermittent right pain 4th finger and radial wrist Aggravating factors: AM stiffness;  playing the piano; wrist radial deviation, pronation; lifting a cup to the microwave Relieving factors: grape juice with pectin ([facility]); ibuprofen  PRECAUTIONS: None     WEIGHT BEARING RESTRICTIONS: No  FALLS:  Has  patient fallen in last 6 months? No  OCCUPATION: Production manager  PLOF: Independent  PATIENT GOALS: strengthening; easing of the pain   OBJECTIVE:   PATIENT SURVEYS :  FOTO 65%  COGNITION: Overall cognitive status: Within functional limits for tasks assessed      The Patient-Specific Functional Scale  Initial:  I am going to ask you to identify up to 3 important activities that you are unable to do or are having difficulty with as a result of this problem.  Today are there any activities that you are unable to do or having difficulty with because of this?  (Patient shown scale and patient rated each activity)  Follow up: When you first came in you had difficulty performing these activities.  Today do you still have difficulty?  Patient-Specific activity scoring scheme (Point to one number):  0 1 2 3 4 5 6 7 8 9  10 Unable                                                                                                          Able to perform To perform                                                                                                    activity at the same Activity         Level as before                                                                                                                       Injury or problem  Activity           Playing piano                                                                       Initial:          7  2.               Cooking, putting something in microwave/twisting wrist              Initial:    7                                      APPEARANCE: minimal edema or bony enlargement  UPPER EXTREMITY ROM:  Decreased DIP and PIP flexion on right   Active ROM Right eval Left eval  Shoulder flexion  full  Shoulder extension    Shoulder abduction  full  Shoulder adduction    Shoulder internal rotation  full  Shoulder external rotation  full  Elbow flexion  full  Elbow extension 0 0  Wrist flexion 58 66  Wrist extension 45 65  Wrist ulnar deviation 20 28  Wrist radial deviation 18 22  Wrist pronation    Wrist supination 80 105  (Blank rows = not tested)  UPPER EXTREMITY MMT:  MMT Right eval Left eval  Shoulder flexion    Shoulder extension    Shoulder abduction    Shoulder adduction    Shoulder internal rotation    Shoulder external rotation    Middle trapezius    Lower trapezius    Elbow flexion 5 5  Elbow extension 5 5  Wrist flexion 4- 5  Wrist extension 4- 5  Wrist ulnar deviation 4- 5  Wrist radial deviation 4- 5  Wrist pronation 4- 5  Wrist supination 4-  5  Grip strength (lbs) 19 35  (Blank rows = not tested) Pincher strength thumb and index:  right 5#; left 6.5# Pincher strength thumb and 4th finger:  rigth 2.5#; left 6# Decreased right hand intrinsic strength 4-/5; finger adductors 3/5   TODAY'S TREATMENT:                                                                                                                                         DATE: 8/29 Moist heat to right hand to warm tissue prior to ex (while discussing status)  Review of intrinsic strengthening:  finger adductor squeeze on towel, finger flexion using blocked fixation to target DIP and PIP joints;  finger abductor with red theraband around fingers Wrist flexion 1# 10x Wrist extension 1#  10x Pronation holding endcap of dumbbell 1# Radial deviation 1# 10x, forearm resting on table Ulnar deviation 1# 10x  Elbow flexion 2# 10x Triceps extension green band with handles 10x Green band horizontal rows 10x    PATIENT EDUCATION: Education details: Educated patient on anatomy and physiology of current symptoms, prognosis, plan of care as well as initial self care strategies to promote recovery;Discussed paraffin baths Light Theraputty  Person educated: Patient Education method: Explanation Education comprehension: verbalized understanding  HOME EXERCISE PROGRAM: Access Code:  [email or web address] URL: [email or web address] Date: 08/13/2023 Prepared by: Lavinia Sharps  Exercises - Finger Abduction with Rubber Band  - 1 x daily - 7 x weekly - 2 sets - 10 reps - Seated Finger Flexion with Resistance  - 1 x daily - 7 x weekly - 2 sets - 10 reps - Seated Finger Adduction and Extension with Towel  - 1 x daily - 7 x weekly - 2 sets - 10 reps - Seated Wrist Flexion with Dumbbell  - 1 x daily - 7 x weekly - 1 sets - 10 reps - Seated Wrist Extension with Dumbbell  - 1 x daily - 7 x weekly - 1 sets - 10 reps - Seated Pronation Supination with Dumbbell  - 1 x daily - 7 x weekly - 1 sets - 10 reps - Seated Wrist Ulnar Deviation with Dumbbell  - 1 x daily - 7 x weekly - 1 sets - 10 reps - Seated Wrist Radial Deviation with Dumbbell  - 1 x daily - 7 x weekly - 1 sets - 10 reps - Seated Single Arm Bicep Curls Neutral with Dumbbell (Mirrored)  - 1 x daily - 7 x weekly - 1 sets - 10 reps - Seated Elbow Extension with Self-Anchored Resistance  - 1 x daily - 7 x weekly - 1 sets - 10 reps - Standing Single Arm Low Row with Anchored Resistance  - 1 x daily - 7 x weekly - 1 sets - 10 reps - Single Arm Shoulder Extension with Anchored Resistance  - 1 x daily - 7 x weekly - 1 sets - 10 reps Access Code: [email or web address] URL: [email or web address] Date: 08/04/2023 Prepared by: Lavinia Sharps  Exercises - Finger Abduction with Rubber Band  - 1 x daily - 7 x weekly - 2 sets - 10 reps - Seated Finger Flexion with Resistance  - 1 x daily - 7 x weekly - 2 sets - 10 reps - Seated Finger Adduction and Extension with Towel  - 1 x daily - 7 x weekly - 2 sets - 10 reps  ASSESSMENT:  CLINICAL IMPRESSION: Low repetitions and resistance secondary to moderate symptom sensitivity.  Verbal cues to optimize technique with exercise.  Discomfort remains < 5/10 throughout session.    OBJECTIVE IMPAIRMENTS: decreased activity tolerance, decreased ROM, decreased strength, impaired perceived functional ability, impaired UE functional use, and pain.   ACTIVITY LIMITATIONS: carrying, lifting, and playing the piano professionally  PARTICIPATION LIMITATIONS: meal prep and occupation  PERSONAL FACTORS: previous history of tendontitis are also affecting patient's functional outcome.   REHAB POTENTIAL: Good  CLINICAL DECISION MAKING: Stable/uncomplicated  EVALUATION COMPLEXITY: Low  GOALS: Goals reviewed with patient? Yes  SHORT TERM GOALS: Target date: 09/01/2023   The patient will demonstrate knowledge of basic self care strategies and exercises to promote healing  Baseline: Goal status: INITIAL  2.  Right wrist strength improved to 4/5 needed for lifting and carrying objects in the kitchen Baseline:  Goal status: INITIAL  3.  Improved wrist ROM: flexion to 64 degrees, extension to 50 degrees needed for playing the piano Baseline:  Goal status: INITIAL  4.  The patient will have grip and finger strength improved to lift a 2# mug in/out of the microwave Baseline:  Goal status: INITIAL   LONG TERM GOALS: Target date: 09/29/2023   The patient will be independent in a safe self progression of a home exercise program to promote further recovery of function   Baseline:  Goal status: INITIAL  2.  The patient will report an improvement in pain hand/wrist mobility needed for  playing the piano with PSFS rating improved to 9 Baseline:  Goal status: INITIAL  3.  The patient will report an improvement with cooking including twisting and lifting a cup to the microwave with PSFS rating improved to 9 Baseline:  Goal status: INITIAL  4.  Right grip strength improved to 29# and pincher strength improved to 6# Baseline:  Goal status: INITIAL  5.  The patient will have improved FOTO score to    72%   indicating improved function with less pain  Baseline:  Goal status: INITIAL   PLAN: PT FREQUENCY: 1-2x/week  PT DURATION: 8 weeks  PLANNED INTERVENTIONS: Therapeutic exercises, Therapeutic activity, Neuromuscular re-education, Patient/Family education, Self Care, Joint mobilization, Dry Needling, Electrical stimulation, Cryotherapy, Moist heat, Taping, Ultrasound, Ionotophoresis 4mg /ml Dexamethasone, Manual therapy, and Re-evaluation  PLAN FOR NEXT SESSION:  finger intrinsic ex;  band or dumbbell wrist flex/pronation and extension/supination ex's; right upper quarter strengthening (elbow and shoulder)  Lavinia Sharps, PT 08/13/23 11:23 PM Phone: [phone] Fax: [phone]

## 2023-08-20 ENCOUNTER — Ambulatory Visit (INDEPENDENT_AMBULATORY_CARE_PROVIDER_SITE_OTHER): Payer: PPO

## 2023-08-20 VITALS — BP 113/72 | HR 65 | Temp 97.4°F | Resp 17 | Ht 66.0 in | Wt 200.0 lb

## 2023-08-20 DIAGNOSIS — M810 Age-related osteoporosis without current pathological fracture: Secondary | ICD-10-CM

## 2023-08-20 MED ORDER — DENOSUMAB 60 MG/ML ~~LOC~~ SOSY
60.0000 mg | PREFILLED_SYRINGE | Freq: Once | SUBCUTANEOUS | Status: AC
  Administered 2023-08-20: 60 mg via SUBCUTANEOUS
  Filled 2023-08-20: qty 1

## 2023-08-20 NOTE — Progress Notes (Signed)
Diagnosis: Osteoporosis  Provider:  Chilton Greathouse MD  Procedure: Injection  Prolia (Denosumab), Dose: 60 mg, Site: subcutaneous, Number of injections: 1  Post Care:  left arm injection  Discharge: Condition: Good, Destination: Home . AVS Provided  Performed by:  Rico Ala, LPN

## 2023-08-24 ENCOUNTER — Encounter: Payer: Self-pay | Admitting: Physical Therapy

## 2023-08-24 ENCOUNTER — Ambulatory Visit: Payer: PPO | Attending: Family Medicine | Admitting: Physical Therapy

## 2023-08-24 DIAGNOSIS — M79644 Pain in right finger(s): Secondary | ICD-10-CM

## 2023-08-24 DIAGNOSIS — M25531 Pain in right wrist: Secondary | ICD-10-CM

## 2023-08-24 DIAGNOSIS — R293 Abnormal posture: Secondary | ICD-10-CM

## 2023-08-24 DIAGNOSIS — M6281 Muscle weakness (generalized): Secondary | ICD-10-CM

## 2023-08-24 DIAGNOSIS — M79631 Pain in right forearm: Secondary | ICD-10-CM | POA: Diagnosis not present

## 2023-08-24 DIAGNOSIS — R279 Unspecified lack of coordination: Secondary | ICD-10-CM

## 2023-08-24 NOTE — Therapy (Signed)
OUTPATIENT PHYSICAL THERAPY UPPER EXTREMITY PROGRESS NOTE   Patient Name: Kimberly Ryan MRN: [id number] DOB:09-20-1951, 72 y.o., female Today's Date: 08/24/2023  END OF SESSION:  PT End of Session - 08/24/23 1239     Visit Number 3    Date for PT Re-Evaluation 09/29/23    Authorization Type [employer]    Progress Note Due on Visit 10    PT Start Time 1235    PT Stop Time 1318    PT Time Calculation (min) 43 min    Activity Tolerance Patient tolerated treatment well    Behavior During Therapy WFL for tasks assessed/performed              Past Medical History:  Diagnosis Date   Anxiety    Arthritis    GERD (gastroesophageal reflux disease)    Headache(784.0)    Shortness of breath    with exertion    Thyroid disease    Past Surgical History:  Procedure Laterality Date   MYOMECTOMY  2000   RHINOPLASTY  1953   THYROID LOBECTOMY  08/17/2012   Procedure: THYROID LOBECTOMY;  Surgeon: Atilano Ina, MD,FACS;  Location: [facility];  Service: General;  Laterality: Left;  Left Thyroid Lobectomy   TONSILLECTOMY  1954   Patient Active Problem List   Diagnosis Date Noted   OP (osteoporosis) 01/06/2023   Postsurgical hypothyroidism 11/04/2012   Multiple thyroid nodules 12/19/2011    PCP: Inez Pilgrim NP  REFERRING PROVIDER: Inez Pilgrim NP  REFERRING DIAG: M25.531 Wrist pain, right   THERAPY DIAG:  Right wrist pain  Rationale for Evaluation and Treatment: Rehabilitation  ONSET DATE: 6 weeks ago  SUBJECTIVE:                                                                                                                                                                                      SUBJECTIVE STATEMENT: Seat belt still bothers the Rt wrist.   I did ok after last session.    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 Not painful at the moment NPRS scale: 0/10 Pain location: intermittent right pain 4th finger and radial wrist Aggravating factors: AM stiffness;  playing the piano; wrist radial deviation, pronation; lifting a cup to the microwave Relieving factors: grape juice with pectin ([facility]);  ibuprofen  PRECAUTIONS: None     WEIGHT BEARING RESTRICTIONS: No  FALLS:  Has patient fallen in last 6 months? No  OCCUPATION: Production manager  PLOF: Independent  PATIENT GOALS: strengthening; easing of the pain   OBJECTIVE:   PATIENT SURVEYS :  FOTO 65%  COGNITION: Overall cognitive status: Within functional limits for tasks assessed      The Patient-Specific Functional Scale  Initial:  I am going to ask you to identify up to 3 important activities that you are unable to do or are having difficulty with as a result of this problem.  Today are there any activities that you are unable to do or having difficulty with because of this?  (Patient shown scale and patient rated each activity)  Follow up: When you first came in you had difficulty performing these activities.  Today do you still have difficulty?  Patient-Specific activity scoring scheme (Point to one number):  0 1 2 3 4 5 6 7 8 9  10 Unable                                                                                                          Able to perform To perform                                                                                                    activity at the same Activity         Level as before                                                                                                                       Injury or problem  Activity            Playing piano  Initial:          7               2.               Cooking, putting something in microwave/twisting wrist              Initial:    7                                      APPEARANCE: minimal edema or bony enlargement  UPPER EXTREMITY ROM:  Decreased DIP and PIP flexion on right   Active ROM Right eval Left eval  Shoulder flexion  full  Shoulder extension    Shoulder abduction  full  Shoulder adduction    Shoulder internal rotation  full  Shoulder external rotation  full  Elbow flexion  full  Elbow extension 0 0  Wrist flexion 58 66  Wrist extension 45 65  Wrist ulnar deviation 20 28  Wrist radial deviation 18 22  Wrist pronation    Wrist supination 80 105  (Blank rows = not tested)  UPPER EXTREMITY MMT:  MMT Right eval Left eval  Shoulder flexion    Shoulder extension    Shoulder abduction    Shoulder adduction    Shoulder internal rotation    Shoulder external rotation    Middle trapezius    Lower trapezius    Elbow flexion 5 5  Elbow extension 5 5  Wrist flexion 4- 5  Wrist extension 4- 5  Wrist ulnar deviation 4- 5  Wrist radial deviation 4- 5  Wrist pronation 4- 5  Wrist supination 4-  5  Grip strength (lbs) 19 35  (Blank rows = not tested) Pincher strength thumb and index:  right 5#; left 6.5# Pincher strength thumb and 4th finger:  rigth 2.5#; left 6# Decreased right hand intrinsic strength 4-/5; finger adductors 3/5   TODAY'S TREATMENT:                                                                                                                                         DATE:  9/9: UBE L2 2x2 PT present to discuss status Red tband standing Rt shoulder x 10 each: IR, ER, shoulder ext, tricep press, bicep curl Standing cable row bil 10lb x 10 Seated 1lb bil UE fwd and diagonal raise x10 rounds Seated wrist all planes 1x10 1lb: flex, ext, rad/ulnar dev, pron/sup Finger  + thumb aDD/aBD 5x5" holds on Rt Medium resistance rubber band finger + thumb spreads x10 on Rt Manual therapy to Rt hand/wrist/fingers in supine: joint distraction wrist to each PIP using massage lotion, thumb CMC, wrist carpal mobs for flexion/extension, passive wrist ext/flexion and circles  8/29 Moist heat to right hand to warm tissue prior to ex (  while discussing status)  Review of intrinsic strengthening:  finger adductor squeeze on towel, finger flexion using blocked fixation to target DIP and PIP joints;  finger abductor with red theraband around fingers Wrist flexion 1# 10x Wrist extension 1# 10x Pronation holding endcap of dumbbell 1# Radial deviation 1# 10x, forearm resting on table Ulnar deviation 1# 10x  Elbow flexion 2# 10x Triceps extension green band with handles 10x Green band horizontal rows 10x    PATIENT EDUCATION: Education details: Educated patient on anatomy and physiology of current symptoms, prognosis, plan of care as well as initial self care strategies to promote recovery;Discussed paraffin baths Light Theraputty  Person educated: Patient Education method: Explanation Education comprehension: verbalized understanding  HOME EXERCISE PROGRAM: Access Code: [email or web address] URL: [email or web address] Date: 08/13/2023 Prepared by: Lavinia Sharps  Exercises - Finger Abduction with Rubber Band  - 1 x daily - 7 x weekly - 2 sets - 10 reps - Seated Finger Flexion with Resistance  - 1 x daily - 7 x weekly - 2 sets - 10 reps - Seated Finger Adduction and Extension with Towel  - 1 x daily - 7 x weekly - 2 sets - 10 reps - Seated Wrist Flexion with Dumbbell  - 1 x daily - 7 x weekly - 1 sets - 10 reps - Seated Wrist Extension with Dumbbell  - 1 x daily - 7 x weekly - 1 sets - 10 reps - Seated Pronation Supination with Dumbbell  - 1 x daily - 7 x weekly - 1 sets - 10 reps - Seated Wrist Ulnar Deviation with Dumbbell  - 1 x daily - 7 x weekly - 1 sets - 10  reps - Seated Wrist Radial Deviation with Dumbbell  - 1 x daily - 7 x weekly - 1 sets - 10 reps - Seated Single Arm Bicep Curls Neutral with Dumbbell (Mirrored)  - 1 x daily - 7 x weekly - 1 sets - 10 reps - Seated Elbow Extension with Self-Anchored Resistance  - 1 x daily - 7 x weekly - 1 sets - 10 reps - Standing Single Arm Low Row with Anchored Resistance  - 1 x daily - 7 x weekly - 1 sets - 10 reps - Single Arm Shoulder Extension with Anchored Resistance  - 1 x daily - 7 x weekly - 1 sets - 10 reps Access Code: [email or web address] URL: [email or web address] Date: 08/04/2023 Prepared by: Lavinia Sharps  Exercises - Finger Abduction with Rubber Band  - 1 x daily - 7 x weekly - 2 sets - 10 reps - Seated Finger Flexion with Resistance  - 1 x daily - 7 x weekly - 2 sets - 10 reps - Seated Finger Adduction and Extension with Towel  - 1 x daily - 7 x weekly - 2 sets - 10 reps  ASSESSMENT:  CLINICAL IMPRESSION: Session focused on therex from shoulder down to finger intrinsics.  PT cued Pt to maintain neutral wrist and wrapped tband around hand to avoid joint pain with tight grip.  Pt had some pain around Rt CMC during wrist and finger exercises which was reduced/eliminated with PT providing some manual joint support and facilitation of movement with reps.  Manual therapy focused on gentle joint distraction and mobs for optimal mechanics and general mobility.  OBJECTIVE IMPAIRMENTS: decreased activity tolerance, decreased ROM, decreased strength, impaired perceived functional ability, impaired UE functional use, and pain.   ACTIVITY LIMITATIONS: carrying, lifting, and playing the piano professionally  PARTICIPATION LIMITATIONS: meal prep and  occupation  PERSONAL FACTORS: previous history of tendontitis are also affecting patient's functional outcome.   REHAB POTENTIAL: Good  CLINICAL DECISION MAKING: Stable/uncomplicated  EVALUATION COMPLEXITY: Low  GOALS: Goals reviewed with  patient? Yes  SHORT TERM GOALS: Target date: 09/01/2023   The patient will demonstrate knowledge of basic self care strategies and exercises to promote healing  Baseline: Goal status: INITIAL  2.  Right wrist strength improved to 4/5 needed for lifting and carrying objects in the kitchen Baseline:  Goal status: INITIAL  3.  Improved wrist ROM: flexion to 64 degrees, extension to 50 degrees needed for playing the piano Baseline:  Goal status: INITIAL  4.  The patient will have grip and finger strength improved to lift a 2# mug in/out of the microwave Baseline:  Goal status: INITIAL   LONG TERM GOALS: Target date: 09/29/2023   The patient will be independent in a safe self progression of a home exercise program to promote further recovery of function   Baseline:  Goal status: INITIAL  2.  The patient will report an improvement in pain hand/wrist mobility needed for playing the piano with PSFS rating improved to 9 Baseline:  Goal status: INITIAL  3.  The patient will report an improvement with cooking including twisting and lifting a cup to the microwave with PSFS rating improved to 9 Baseline:  Goal status: INITIAL  4.  Right grip strength improved to 29# and pincher strength improved to 6# Baseline:  Goal status: INITIAL  5.  The patient will have improved FOTO score to    72%   indicating improved function with less pain  Baseline:  Goal status: INITIAL   PLAN: PT FREQUENCY: 1-2x/week  PT DURATION: 8 weeks  PLANNED INTERVENTIONS: Therapeutic exercises, Therapeutic activity, Neuromuscular re-education, Patient/Family education, Self Care, Joint mobilization, Dry Needling, Electrical stimulation, Cryotherapy, Moist heat, Taping, Ultrasound, Ionotophoresis 4mg /ml Dexamethasone, Manual therapy, and Re-evaluation  PLAN FOR NEXT SESSION:  add functional task performance for mug transfers, finger intrinsic ex;  band or dumbbell wrist flex/pronation and  extension/supination ex's; right upper quarter strengthening (elbow and shoulder)  Hieu Herms, PT 08/24/23 1:36 PM  Phone: [phone] Fax: [phone]

## 2023-08-27 ENCOUNTER — Encounter: Payer: Self-pay | Admitting: Physical Therapy

## 2023-08-27 ENCOUNTER — Ambulatory Visit: Payer: PPO | Admitting: Physical Therapy

## 2023-08-27 DIAGNOSIS — M79631 Pain in right forearm: Secondary | ICD-10-CM

## 2023-08-27 DIAGNOSIS — M6281 Muscle weakness (generalized): Secondary | ICD-10-CM

## 2023-08-27 DIAGNOSIS — M79644 Pain in right finger(s): Secondary | ICD-10-CM

## 2023-08-27 DIAGNOSIS — M25531 Pain in right wrist: Secondary | ICD-10-CM

## 2023-08-27 NOTE — Therapy (Signed)
OUTPATIENT PHYSICAL THERAPY UPPER EXTREMITY PROGRESS NOTE   Patient Name: Kimberly Ryan MRN: [id number] DOB:Apr 04, 1951, 72 y.o., female Today's Date: 08/27/2023  END OF SESSION:  PT End of Session - 08/27/23 1151     Visit Number 4    Date for PT Re-Evaluation 09/29/23    Authorization Type [employer]    Progress Note Due on Visit 10    PT Start Time 1150    PT Stop Time 1230    PT Time Calculation (min) 40 min    Activity Tolerance Patient tolerated treatment well    Behavior During Therapy WFL for tasks assessed/performed               Past Medical History:  Diagnosis Date   Anxiety    Arthritis    GERD (gastroesophageal reflux disease)    Headache(784.0)    Shortness of breath    with exertion    Thyroid disease    Past Surgical History:  Procedure Laterality Date   MYOMECTOMY  2000   RHINOPLASTY  1953   THYROID LOBECTOMY  08/17/2012   Procedure: THYROID LOBECTOMY;  Surgeon: Kimberly Ina, MD,FACS;  Location: [facility];  Service: General;  Laterality: Left;  Left Thyroid Lobectomy   TONSILLECTOMY  1954   Patient Active Problem List   Diagnosis Date Noted   OP (osteoporosis) 01/06/2023   Postsurgical hypothyroidism 11/04/2012   Multiple thyroid nodules 12/19/2011    PCP: Kimberly Pilgrim NP  REFERRING PROVIDER: Inez Pilgrim NP  REFERRING DIAG: M25.531 Wrist pain, right   THERAPY DIAG:  Right wrist pain  Rationale for Evaluation and Treatment: Rehabilitation  ONSET DATE: 6 weeks ago  SUBJECTIVE:                                                                                                                                                                                      SUBJECTIVE STATEMENT:  My wrist was achy up to a 6/10 but was back to normal the next day.  Pain is 0-2/10.  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 Not painful at the moment NPRS scale: 0/10 Pain location: intermittent right pain 4th finger and radial wrist Aggravating factors: AM stiffness;  playing the piano; wrist radial deviation, pronation; lifting a cup to the microwave Relieving factors: grape  juice with pectin ([facility]); ibuprofen  PRECAUTIONS: None     WEIGHT BEARING RESTRICTIONS: No  FALLS:  Has patient fallen in last 6 months? No  OCCUPATION: Production manager  PLOF: Independent  PATIENT GOALS: strengthening; easing of the pain   OBJECTIVE:   PATIENT SURVEYS :  FOTO 65%  COGNITION: Overall cognitive status: Within functional limits for tasks assessed      The Patient-Specific Functional Scale  Initial:  I am going to ask you to identify up to 3 important activities that you are unable to do or are having difficulty with as a result of this problem.  Today are there any activities that you are unable to do or having difficulty with because of this?  (Patient shown scale and patient rated each activity)  Follow up: When you first came in you had difficulty performing these activities.  Today do you still have difficulty?  Patient-Specific activity scoring scheme (Point to one number):  0 1 2 3 4 5 6 7 8 9  10 Unable                                                                                                          Able to perform To perform                                                                                                    activity at the same Activity         Level as before                                                                                                                       Injury or  problem  Activity           Playing piano  Initial:          7               2.               Cooking, putting something in microwave/twisting wrist              Initial:    7                                      APPEARANCE: minimal edema or bony enlargement  UPPER EXTREMITY ROM:  Decreased DIP and PIP flexion on right   Active ROM Right eval Left eval  Shoulder flexion  full  Shoulder extension    Shoulder abduction  full  Shoulder adduction    Shoulder internal rotation  full  Shoulder external rotation  full  Elbow flexion  full  Elbow extension 0 0  Wrist flexion 58 66  Wrist extension 45 65  Wrist ulnar deviation 20 28  Wrist radial deviation 18 22  Wrist pronation    Wrist supination 80 105  (Blank rows = not tested)  UPPER EXTREMITY MMT:  MMT Right eval Left eval  Shoulder flexion    Shoulder extension    Shoulder abduction    Shoulder adduction    Shoulder internal rotation    Shoulder external rotation    Middle trapezius    Lower trapezius    Elbow flexion 5 5  Elbow extension 5 5  Wrist flexion 4- 5  Wrist extension 4- 5  Wrist ulnar deviation 4- 5  Wrist radial deviation 4- 5  Wrist pronation 4- 5  Wrist supination 4-  5  Grip strength (lbs) 19 35  (Blank rows = not tested) Pincher strength thumb and index:  right 5#; left 6.5# Pincher strength thumb and 4th finger:  rigth 2.5#; left 6# Decreased right hand intrinsic strength 4-/5; finger adductors 3/5   TODAY'S TREATMENT:                                                                                                                                         DATE:  9/12: UBE L2 2x2 PT present to discuss status 2lb Rt UE reach taps to overhead cabinets x10 each Flex grip with rubber circle 2x10 - PT encouraged CMC mobility with thumb toward pinky with squeeze Seated wrist extension over towel with hand opening to "high five  position" x 10 Thumb/pinky wall walk "inch worm" for thumb opposition and abduction Rt thumb ext and abd A/ROM 1x10, then 1x10 with medium rubber band resistance 1lb Rt wrist strength all directions x 10 Hold end of 1lb dumbbell and circles each way x 5  9/9: UBE L2 2x2 PT present to discuss status Red tband standing Rt shoulder x 10 each: IR, ER,  shoulder ext, tricep press, bicep curl Standing cable row bil 10lb x 10 Seated 1lb bil UE fwd and diagonal raise x10 rounds Seated wrist all planes 1x10 1lb: flex, ext, rad/ulnar dev, pron/sup Finger + thumb aDD/aBD 5x5" holds on Rt Medium resistance rubber band finger + thumb spreads x10 on Rt Manual therapy to Rt hand/wrist/fingers in supine: joint distraction wrist to each PIP using massage lotion, thumb CMC, wrist carpal mobs for flexion/extension, passive wrist ext/flexion and circles  8/29 Moist heat to right hand to warm tissue prior to ex (while discussing status)  Review of intrinsic strengthening:  finger adductor squeeze on towel, finger flexion using blocked fixation to target DIP and PIP joints;  finger abductor with red theraband around fingers Wrist flexion 1# 10x Wrist extension 1# 10x Pronation holding endcap of dumbbell 1# Radial deviation 1# 10x, forearm resting on table Ulnar deviation 1# 10x  Elbow flexion 2# 10x Triceps extension green band with handles 10x Green band horizontal rows 10x    PATIENT EDUCATION: Education details: Educated patient on anatomy and physiology of current symptoms, prognosis, plan of care as well as initial self care strategies to promote recovery;Discussed paraffin baths Light Theraputty  Person educated: Patient Education method: Explanation Education comprehension: verbalized understanding  HOME EXERCISE PROGRAM: Access Code: [email or web address] URL: [email or web address] Date: 08/27/2023 Prepared by: Kimberly Ryan Naithan Delage  Exercises - Finger Abduction with Rubber Band  - 1 x daily  - 7 x weekly - 2 sets - 10 reps - Seated Finger Flexion with Resistance  - 1 x daily - 7 x weekly - 2 sets - 10 reps - Seated Finger Adduction and Extension with Towel  - 1 x daily - 7 x weekly - 2 sets - 10 reps - Seated Wrist Flexion with Dumbbell  - 1 x daily - 7 x weekly - 1 sets - 10 reps - Seated Wrist Extension with Dumbbell  - 1 x daily - 7 x weekly - 1 sets - 10 reps - Seated Pronation Supination with Dumbbell  - 1 x daily - 7 x weekly - 1 sets - 10 reps - Seated Wrist Ulnar Deviation with Dumbbell  - 1 x daily - 7 x weekly - 1 sets - 10 reps - Seated Wrist Radial Deviation with Dumbbell  - 1 x daily - 7 x weekly - 1 sets - 10 reps - Seated Single Arm Bicep Curls Neutral with Dumbbell (Mirrored)  - 1 x daily - 7 x weekly - 1 sets - 10 reps - Seated Elbow Extension with Self-Anchored Resistance  - 1 x daily - 7 x weekly - 1 sets - 10 reps - Standing Single Arm Low Row with Anchored Resistance  - 1 x daily - 7 x weekly - 1 sets - 10 reps - Single Arm Shoulder Extension with Anchored Resistance  - 1 x daily - 7 x weekly - 1 sets - 10 reps - Thumb Radial Abduction with Rubber Band - Palm Down  - 1 x daily - 7 x weekly - 3 sets - 10 reps - Thumb Flexion with Resistance  - 1 x daily - 7 x weekly - 3 sets - 10 reps - Seated Thumb Extension with Resistance  - 1 x daily - 7 x weekly - 3 sets - 10 reps  ASSESSMENT:  CLINICAL IMPRESSION: Pt reports she had some soreness in wrist and thumb after last session that recovered within 24 hours.  She will have pain along radial wrist/Rt CMC joint when intiially  playing piano (reaching thumb and 5th digit away from each other) but this improves as she plays, suggesting possible arthritic pattern.  She is stiff and tends to not use Rt CMC so we focused on targeted movements at thumb incorporating motion at [facility] today.  She was able to add rubber band and weight to thumb/wrist strength today.    OBJECTIVE IMPAIRMENTS: decreased activity tolerance, decreased  ROM, decreased strength, impaired perceived functional ability, impaired UE functional use, and pain.   ACTIVITY LIMITATIONS: carrying, lifting, and playing the piano professionally  PARTICIPATION LIMITATIONS: meal prep and occupation  PERSONAL FACTORS: previous history of tendontitis are also affecting patient's functional outcome.   REHAB POTENTIAL: Good  CLINICAL DECISION MAKING: Stable/uncomplicated  EVALUATION COMPLEXITY: Low  GOALS: Goals reviewed with patient? Yes  SHORT TERM GOALS: Target date: 09/01/2023   The patient will demonstrate knowledge of basic self care strategies and exercises to promote healing  Baseline: Goal status:MET 9/12  2.  Right wrist strength improved to 4/5 needed for lifting and carrying objects in the kitchen Baseline:  Goal status: ONGOING 9/12  3.  Improved wrist ROM: flexion to 64 degrees, extension to 50 degrees needed for playing the piano Baseline:  Goal status: INITIAL  4.  The patient will have grip and finger strength improved to lift a 2# mug in/out of the microwave Baseline:  Goal status: ONGOING 9/12   LONG TERM GOALS: Target date: 09/29/2023   The patient will be independent in a safe self progression of a home exercise program to promote further recovery of function   Baseline:  Goal status: INITIAL  2.  The patient will report an improvement in pain hand/wrist mobility needed for playing the piano with PSFS rating improved to 9 Baseline:  Goal status: INITIAL  3.  The patient will report an improvement with cooking including twisting and lifting a cup to the microwave with PSFS rating improved to 9 Baseline:  Goal status: INITIAL  4.  Right grip strength improved to 29# and pincher strength improved to 6# Baseline:  Goal status: INITIAL  5.  The patient will have improved FOTO score to    72%   indicating improved function with less pain  Baseline:  Goal status: INITIAL   PLAN: PT FREQUENCY: 1-2x/week  PT  DURATION: 8 weeks  PLANNED INTERVENTIONS: Therapeutic exercises, Therapeutic activity, Neuromuscular re-education, Patient/Family education, Self Care, Joint mobilization, Dry Needling, Electrical stimulation, Cryotherapy, Moist heat, Taping, Ultrasound, Ionotophoresis 4mg /ml Dexamethasone, Manual therapy, and Re-evaluation  PLAN FOR NEXT SESSION:  add functional task performance for mug transfers, finger intrinsic ex;  band or dumbbell wrist flex/pronation and extension/supination ex's; right upper quarter strengthening (elbow and shoulder)  Ellice Boultinghouse, PT 08/27/23 1:32 PM   Phone: [phone] Fax: [phone]

## 2023-09-01 ENCOUNTER — Ambulatory Visit: Payer: PPO | Admitting: Physical Therapy

## 2023-09-01 ENCOUNTER — Encounter: Payer: Self-pay | Admitting: Physical Therapy

## 2023-09-01 DIAGNOSIS — M79631 Pain in right forearm: Secondary | ICD-10-CM

## 2023-09-01 DIAGNOSIS — M25531 Pain in right wrist: Secondary | ICD-10-CM

## 2023-09-01 DIAGNOSIS — M6281 Muscle weakness (generalized): Secondary | ICD-10-CM

## 2023-09-01 DIAGNOSIS — M79644 Pain in right finger(s): Secondary | ICD-10-CM

## 2023-09-01 NOTE — Therapy (Signed)
OUTPATIENT PHYSICAL THERAPY UPPER EXTREMITY PROGRESS NOTE   Patient Name: Kimberly Ryan MRN: [id number] DOB:Apr 17, 1951, 72 y.o., female Today's Date: 09/01/2023  END OF SESSION:  PT End of Session - 09/01/23 1530     Visit Number 5    Date for PT Re-Evaluation 09/29/23    Authorization Type [employer]    Progress Note Due on Visit 10    PT Start Time 1450    PT Stop Time 1530    PT Time Calculation (min) 40 min    Activity Tolerance Patient tolerated treatment well    Behavior During Therapy WFL for tasks assessed/performed                Past Medical History:  Diagnosis Date   Anxiety    Arthritis    GERD (gastroesophageal reflux disease)    Headache(784.0)    Shortness of breath    with exertion    Thyroid disease    Past Surgical History:  Procedure Laterality Date   MYOMECTOMY  2000   RHINOPLASTY  1953   THYROID LOBECTOMY  08/17/2012   Procedure: THYROID LOBECTOMY;  Surgeon: Atilano Ina, MD,FACS;  Location: [facility];  Service: General;  Laterality: Left;  Left Thyroid Lobectomy   TONSILLECTOMY  1954   Patient Active Problem List   Diagnosis Date Noted   OP (osteoporosis) 01/06/2023   Postsurgical hypothyroidism 11/04/2012   Multiple thyroid nodules 12/19/2011    PCP: Inez Pilgrim NP  REFERRING PROVIDER: Inez Pilgrim NP  REFERRING DIAG: M25.531 Wrist pain, right   THERAPY DIAG:  Right wrist pain  Rationale for Evaluation and Treatment: Rehabilitation  ONSET DATE: 6 weeks ago  SUBJECTIVE:                                                                                                                                                                                      SUBJECTIVE STATEMENT:  I have been busy for the past few days I have not be able to do my exercises. I am not having any pain currently.   Hand dominance: Right  PERTINENT HISTORY: Pt is a Production manager Had PT in past for hand/finger  tendonitis with Loistine Simas History many years ago of right wrist fracture (no hardware) Osteopenia (bone density test 3 weeks ago improved on 1 hip, worse on the other) Glucosamine/chondroitin Tried previous HEP 1# wrist ex's but seemed to worsen  PAIN:  PAIN:  Are you having pain? Not painful at the moment NPRS scale: 0/10 Pain location: intermittent right pain 4th finger and radial wrist Aggravating factors: AM stiffness;  playing the piano; wrist radial deviation, pronation; lifting a  cup to the microwave Relieving factors: grape juice with pectin ([facility]); ibuprofen  PRECAUTIONS: None     WEIGHT BEARING RESTRICTIONS: No  FALLS:  Has patient fallen in last 6 months? No  OCCUPATION: Production manager  PLOF: Independent  PATIENT GOALS: strengthening; easing of the pain   OBJECTIVE:   PATIENT SURVEYS :  FOTO 65%  COGNITION: Overall cognitive status: Within functional limits for tasks assessed      The Patient-Specific Functional Scale  Initial:  I am going to ask you to identify up to 3 important activities that you are unable to do or are having difficulty with as a result of this problem.  Today are there any activities that you are unable to do or having difficulty with because of this?  (Patient shown scale and patient rated each activity)  Follow up: When you first came in you had difficulty performing these activities.  Today do you still have difficulty?  Patient-Specific activity scoring scheme (Point to one number):  0 1 2 3 4 5 6 7 8 9  10 Unable                                                                                                          Able to perform To perform                                                                                                    activity at the same Activity         Level as before                                                                                                                        Injury or problem  Activity           Playing piano  Initial:          7               2.               Cooking, putting something in microwave/twisting wrist              Initial:    7                                      APPEARANCE: minimal edema or bony enlargement  UPPER EXTREMITY ROM:  Decreased DIP and PIP flexion on right   Active ROM Right eval Left eval  Shoulder flexion  full  Shoulder extension    Shoulder abduction  full  Shoulder adduction    Shoulder internal rotation  full  Shoulder external rotation  full  Elbow flexion  full  Elbow extension 0 0  Wrist flexion 58 66  Wrist extension 45 65  Wrist ulnar deviation 20 28  Wrist radial deviation 18 22  Wrist pronation    Wrist supination 80 105  (Blank rows = not tested)  UPPER EXTREMITY MMT:  MMT Right eval Left eval  Shoulder flexion    Shoulder extension    Shoulder abduction    Shoulder adduction    Shoulder internal rotation    Shoulder external rotation    Middle trapezius    Lower trapezius    Elbow flexion 5 5  Elbow extension 5 5  Wrist flexion 4- 5  Wrist extension 4- 5  Wrist ulnar deviation 4- 5  Wrist radial deviation 4- 5  Wrist pronation 4- 5  Wrist supination 4-  5  Grip strength (lbs) 19 35  (Blank rows = not tested) Pincher strength thumb and index:  right 5#; left 6.5# Pincher strength thumb and 4th finger:  rigth 2.5#; left 6# Decreased right hand intrinsic strength 4-/5; finger adductors 3/5   TODAY'S TREATMENT:                                                                                                                                         DATE:  9/17: Moist Heat to Rt thumb at beginning and end of treatment session Rt thumb ext and abd A/ROM 1x10 each, then 1x10 with medium rubber band resistance Rt thumb in fist x 10  Rt thumb to pinky opposition x 10  Rt thumb to each finger x 10 1-2  sec hold Chain Pull Isometric x5 Blocked thumb CMC flexion x10 Flex grip with rubber circle 2x10 - PT encouraged CMC mobility with thumb toward pinky with squeeze 1lb Rt wrist strength all directions x 10 (flexion/extension; pronation/ supination) Biceps Curl 4# x 10 Triceps Extension 5lb at Cable Column x 10 Y's at wall w/ green loop around wrists x 10  Wall Push Ups x 10  9/12: UBE L2 2x2 PT present to discuss status 2lb Rt UE reach taps to overhead cabinets x10 each Flex grip with rubber circle 2x10 - PT encouraged CMC mobility with thumb toward pinky with squeeze Seated wrist extension over towel with hand opening to "high five position" x 10 Thumb/pinky wall walk "inch worm" for thumb opposition and abduction Rt thumb ext and abd A/ROM 1x10, then 1x10 with medium rubber band resistance 1lb Rt wrist strength all directions x 10 Hold end of 1lb dumbbell and circles each way x 5  9/9: UBE L2 2x2 PT present to discuss status Red tband standing Rt shoulder x 10 each: IR, ER, shoulder ext, tricep press, bicep curl Standing cable row bil 10lb x 10 Seated 1lb bil UE fwd and diagonal raise x10 rounds Seated wrist all planes 1x10 1lb: flex, ext, rad/ulnar dev, pron/sup Finger + thumb aDD/aBD 5x5" holds on Rt Medium resistance rubber band finger + thumb spreads x10 on Rt Manual therapy to Rt hand/wrist/fingers in supine: joint distraction wrist to each PIP using massage lotion, thumb CMC, wrist carpal mobs for flexion/extension, passive wrist ext/flexion and circles  8/29 Moist heat to right hand to warm tissue prior to ex (while discussing status)  Review of intrinsic strengthening:  finger adductor squeeze on towel, finger flexion using blocked fixation to target DIP and PIP joints;  finger abductor with red theraband around fingers Wrist flexion 1# 10x Wrist extension 1# 10x Pronation holding endcap of dumbbell 1# Radial deviation 1# 10x, forearm resting on table Ulnar deviation 1#  10x  Elbow flexion 2# 10x Triceps extension green band with handles 10x Green band horizontal rows 10x    PATIENT EDUCATION: Education details: Educated patient on anatomy and physiology of current symptoms, prognosis, plan of care as well as initial self care strategies to promote recovery;Discussed paraffin baths Light Theraputty  Person educated: Patient Education method: Explanation Education comprehension: verbalized understanding  HOME EXERCISE PROGRAM: Access Code: [email or web address] URL: [email or web address] Date: 08/27/2023 Prepared by: Loistine Simas Beuhring  Exercises - Finger Abduction with Rubber Band  - 1 x daily - 7 x weekly - 2 sets - 10 reps - Seated Finger Flexion with Resistance  - 1 x daily - 7 x weekly - 2 sets - 10 reps - Seated Finger Adduction and Extension with Towel  - 1 x daily - 7 x weekly - 2 sets - 10 reps - Seated Wrist Flexion with Dumbbell  - 1 x daily - 7 x weekly - 1 sets - 10 reps - Seated Wrist Extension with Dumbbell  - 1 x daily - 7 x weekly - 1 sets - 10 reps - Seated Pronation Supination with Dumbbell  - 1 x daily - 7 x weekly - 1 sets - 10 reps - Seated Wrist Ulnar Deviation with Dumbbell  - 1 x daily - 7 x weekly - 1 sets - 10 reps - Seated Wrist Radial Deviation with Dumbbell  - 1 x daily - 7 x weekly - 1 sets - 10 reps - Seated Single Arm Bicep Curls Neutral with Dumbbell (Mirrored)  - 1 x daily - 7 x weekly - 1 sets - 10 reps - Seated Elbow Extension with Self-Anchored Resistance  - 1 x daily - 7 x weekly - 1 sets - 10 reps - Standing Single Arm Low Row with Anchored Resistance  - 1 x daily - 7 x weekly - 1 sets - 10 reps - Single Arm Shoulder Extension  with Anchored Resistance  - 1 x daily - 7 x weekly - 1 sets - 10 reps - Thumb Radial Abduction with Rubber Band - Palm Down  - 1 x daily - 7 x weekly - 3 sets - 10 reps - Thumb Flexion with Resistance  - 1 x daily - 7 x weekly - 3 sets - 10 reps - Seated Thumb Extension with Resistance  -  1 x daily - 7 x weekly - 3 sets - 10 reps  ASSESSMENT:  CLINICAL IMPRESSION: Today's treatment session focused on improving Rt thumb joint mobility and strengthening. Educated patient on a good warm up to perform before playing a piano. Patient tolerated treatment session well and did not experience increased pain. Incorporated shoulder stability exercises for improved posture. When performing wall push ups patient experienced some pain in thumb, but went away after moving closer to the wall. Patient is making good progress with therapy; discussed potentially decreasing frequency to once a week. Patient was agreeable. Patient will continue to benefit from skilled therapy to address current impairments and functional limitations listed below.  OBJECTIVE IMPAIRMENTS: decreased activity tolerance, decreased ROM, decreased strength, impaired perceived functional ability, impaired UE functional use, and pain.   ACTIVITY LIMITATIONS: carrying, lifting, and playing the piano professionally  PARTICIPATION LIMITATIONS: meal prep and occupation  PERSONAL FACTORS: previous history of tendontitis are also affecting patient's functional outcome.   REHAB POTENTIAL: Good  CLINICAL DECISION MAKING: Stable/uncomplicated  EVALUATION COMPLEXITY: Low  GOALS: Goals reviewed with patient? Yes  SHORT TERM GOALS: Target date: 09/01/2023   The patient will demonstrate knowledge of basic self care strategies and exercises to promote healing  Baseline: Goal status:MET 9/12  2.  Right wrist strength improved to 4/5 needed for lifting and carrying objects in the kitchen Baseline:  Goal status: ONGOING 9/12  3.  Improved wrist ROM: flexion to 64 degrees, extension to 50 degrees needed for playing the piano Baseline:  Goal status: INITIAL  4.  The patient will have grip and finger strength improved to lift a 2# mug in/out of the microwave Baseline:  Goal status: ONGOING 9/12   LONG TERM GOALS: Target  date: 09/29/2023   The patient will be independent in a safe self progression of a home exercise program to promote further recovery of function   Baseline:  Goal status: INITIAL  2.  The patient will report an improvement in pain hand/wrist mobility needed for playing the piano with PSFS rating improved to 9 Baseline:  Goal status: INITIAL  3.  The patient will report an improvement with cooking including twisting and lifting a cup to the microwave with PSFS rating improved to 9 Baseline:  Goal status: INITIAL  4.  Right grip strength improved to 29# and pincher strength improved to 6# Baseline:  Goal status: INITIAL  5.  The patient will have improved FOTO score to    72%   indicating improved function with less pain  Baseline:  Goal status: INITIAL   PLAN: PT FREQUENCY: 1-2x/week  PT DURATION: 8 weeks  PLANNED INTERVENTIONS: Therapeutic exercises, Therapeutic activity, Neuromuscular re-education, Patient/Family education, Self Care, Joint mobilization, Dry Needling, Electrical stimulation, Cryotherapy, Moist heat, Taping, Ultrasound, Ionotophoresis 4mg /ml Dexamethasone, Manual therapy, and Re-evaluation  PLAN FOR NEXT SESSION:  Assess HEP and tolerance to change in frequency of treatment sessions;   Lavinia Sharps, PT 09/01/23 4:36 PM Phone: [phone] Fax: [phone]   Phone: [phone] Fax: [phone]

## 2023-09-03 ENCOUNTER — Ambulatory Visit: Payer: PPO | Admitting: Physical Therapy

## 2023-09-08 ENCOUNTER — Encounter: Payer: Self-pay | Admitting: Endocrinology

## 2023-09-08 ENCOUNTER — Ambulatory Visit: Payer: PPO | Admitting: Physical Therapy

## 2023-09-08 DIAGNOSIS — M25531 Pain in right wrist: Secondary | ICD-10-CM

## 2023-09-08 DIAGNOSIS — M6281 Muscle weakness (generalized): Secondary | ICD-10-CM

## 2023-09-08 DIAGNOSIS — M79644 Pain in right finger(s): Secondary | ICD-10-CM

## 2023-09-08 NOTE — Progress Notes (Signed)
This encounter was created in error - please disregard.

## 2023-09-08 NOTE — Therapy (Signed)
OUTPATIENT PHYSICAL THERAPY UPPER EXTREMITY PROGRESS NOTE   Patient Name: Kimberly Ryan MRN: [id number] DOB:May 20, 1951, 72 y.o., female Today's Date: 09/08/2023  END OF SESSION:  PT End of Session - 09/08/23 1148     Visit Number 6    Date for PT Re-Evaluation 09/29/23    Authorization Type [employer]    Progress Note Due on Visit 10    PT Start Time 1149    PT Stop Time 1229    PT Time Calculation (min) 40 min    Activity Tolerance Patient tolerated treatment well                Past Medical History:  Diagnosis Date   Anxiety    Arthritis    GERD (gastroesophageal reflux disease)    Headache(784.0)    Shortness of breath    with exertion    Thyroid disease    Past Surgical History:  Procedure Laterality Date   MYOMECTOMY  2000   RHINOPLASTY  1953   THYROID LOBECTOMY  08/17/2012   Procedure: THYROID LOBECTOMY;  Surgeon: Atilano Ina, MD,FACS;  Location: [facility];  Service: General;  Laterality: Left;  Left Thyroid Lobectomy   TONSILLECTOMY  1954   Patient Active Problem List   Diagnosis Date Noted   OP (osteoporosis) 01/06/2023   Postsurgical hypothyroidism 11/04/2012   Multiple thyroid nodules 12/19/2011    PCP: Inez Pilgrim NP  REFERRING PROVIDER: Inez Pilgrim NP  REFERRING DIAG: M25.531 Wrist pain, right   THERAPY DIAG:  Right wrist pain  Rationale for Evaluation and Treatment: Rehabilitation  ONSET DATE: 6 weeks ago  SUBJECTIVE:                                                                                                                                                                                      SUBJECTIVE STATEMENT: Basically OK but last night it bothered me.  A lot of activity in the house set it off.  Massage bothers it.  Full coffee cup would hurt but not 1/2 full.  Pushing the seat belt buckle down.  Twisting or rotating with resistance would bother me.   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 Not painful at the moment NPRS scale: 0/10 Pain location: intermittent right pain 4th finger and radial wrist Aggravating  factors: AM stiffness;  playing the piano; wrist radial deviation, pronation; lifting a cup to the microwave Relieving factors: grape juice with pectin ([facility]); ibuprofen  PRECAUTIONS: None     WEIGHT BEARING RESTRICTIONS: No  FALLS:  Has patient fallen in last 6 months? No  OCCUPATION: Production manager  PLOF: Independent  PATIENT GOALS: strengthening; easing of the pain   OBJECTIVE:   PATIENT SURVEYS :  FOTO 65%  COGNITION: Overall cognitive status: Within functional limits for tasks assessed      The Patient-Specific Functional Scale  Initial:  I am going to ask you to identify up to 3 important activities that you are unable to do or are having difficulty with as a result of this problem.  Today are there any activities that you are unable to do or having difficulty with because of this?  (Patient shown scale and patient rated each activity)  Follow up: When you first came in you had difficulty performing these activities.  Today do you still have difficulty?  Patient-Specific activity scoring scheme (Point to one number):  0 1 2 3 4 5 6 7 8 9  10 Unable                                                                                                          Able to perform To perform                                                                                                    activity at the same Activity         Level as before                                                                                                                        Injury or problem  Activity           Playing piano  Initial:          7               2.               Cooking, putting something in microwave/twisting wrist              Initial:    7                                      APPEARANCE: minimal edema or bony enlargement  UPPER EXTREMITY ROM:  Decreased DIP and PIP flexion on right   Active ROM Right eval Left eval  Shoulder flexion  full  Shoulder extension    Shoulder abduction  full  Shoulder adduction    Shoulder internal rotation  full  Shoulder external rotation  full  Elbow flexion  full  Elbow extension 0 0  Wrist flexion 58 66  Wrist extension 45 65  Wrist ulnar deviation 20 28  Wrist radial deviation 18 22  Wrist pronation    Wrist supination 80 105  (Blank rows = not tested)  UPPER EXTREMITY MMT:  MMT Right eval Left eval  Shoulder flexion    Shoulder extension    Shoulder abduction    Shoulder adduction    Shoulder internal rotation    Shoulder external rotation    Middle trapezius    Lower trapezius    Elbow flexion 5 5  Elbow extension 5 5  Wrist flexion 4- 5  Wrist extension 4- 5  Wrist ulnar deviation 4- 5  Wrist radial deviation 4- 5  Wrist pronation 4- 5  Wrist supination 4-  5  Grip strength (lbs) 19 35  (Blank rows = not tested) Pincher strength thumb and index:  right 5#; left 6.5# Pincher strength thumb and 4th finger:  rigth 2.5#; left 6# Decreased right hand intrinsic strength 4-/5; finger adductors 3/5   TODAY'S TREATMENT:                                                                                                                                         DATE:  9/24: Yellow extra soft putty per HEP below:  recommend limiting to 8 min at a time for home (pt given putty for home use) Education on hand anatomy: muscles, tendons of the  hand Biceps Curl 5# x 10 Triceps Extension green band x 10 Review of HEP and frequency/dosage  9/17: Moist Heat to Rt thumb at beginning and end of treatment session Rt thumb ext and abd A/ROM 1x10 each, then 1x10 with medium rubber band resistance Rt thumb in fist x 10  Rt thumb to pinky opposition x 10  Rt thumb to each finger x 10 1-2 sec hold Chain Pull Isometric x5 Blocked thumb CMC flexion x10  Flex grip with rubber circle 2x10 - PT encouraged CMC mobility with thumb toward pinky with squeeze 1lb Rt wrist strength all directions x 10 (flexion/extension; pronation/ supination) Biceps Curl 4# x 10 Triceps Extension 5lb at Cable Column x 10 Y's at wall w/ green loop around wrists x 10 Wall Push Ups x 10  9/12: UBE L2 2x2 PT present to discuss status 2lb Rt UE reach taps to overhead cabinets x10 each Flex grip with rubber circle 2x10 - PT encouraged CMC mobility with thumb toward pinky with squeeze Seated wrist extension over towel with hand opening to "high five position" x 10 Thumb/pinky wall walk "inch worm" for thumb opposition and abduction Rt thumb ext and abd A/ROM 1x10, then 1x10 with medium rubber band resistance 1lb Rt wrist strength all directions x 10 Hold end of 1lb dumbbell and circles each way x 5  9/9: UBE L2 2x2 PT present to discuss status Red tband standing Rt shoulder x 10 each: IR, ER, shoulder ext, tricep press, bicep curl Standing cable row bil 10lb x 10 Seated 1lb bil UE fwd and diagonal raise x10 rounds Seated wrist all planes 1x10 1lb: flex, ext, rad/ulnar dev, pron/sup Finger + thumb aDD/aBD 5x5" holds on Rt Medium resistance rubber band finger + thumb spreads x10 on Rt Manual therapy to Rt hand/wrist/fingers in supine: joint distraction wrist to each PIP using massage lotion, thumb CMC, wrist carpal mobs for flexion/extension, passive wrist ext/flexion and circles  8/29 Moist heat to right hand to warm tissue prior to ex (while discussing status)   Review of intrinsic strengthening:  finger adductor squeeze on towel, finger flexion using blocked fixation to target DIP and PIP joints;  finger abductor with red theraband around fingers Wrist flexion 1# 10x Wrist extension 1# 10x Pronation holding endcap of dumbbell 1# Radial deviation 1# 10x, forearm resting on table Ulnar deviation 1# 10x  Elbow flexion 2# 10x Triceps extension green band with handles 10x Green band horizontal rows 10x    PATIENT EDUCATION: Education details: Educated patient on anatomy and physiology of current symptoms, prognosis, plan of care as well as initial self care strategies to promote recovery;Discussed paraffin baths Light Theraputty  Person educated: Patient Education method: Explanation Education comprehension: verbalized understanding  HOME EXERCISE PROGRAM: Access Code: [email or web address] URL: [email or web address] Date: 09/08/2023 Prepared by: Lavinia Sharps  Exercises - Finger Abduction with Rubber Band  - 1 x daily - 7 x weekly - 2 sets - 10 reps - Seated Finger Flexion with Resistance  - 1 x daily - 7 x weekly - 2 sets - 10 reps - Seated Finger Adduction and Extension with Towel  - 1 x daily - 7 x weekly - 2 sets - 10 reps - Seated Wrist Flexion with Dumbbell  - 1 x daily - 7 x weekly - 1 sets - 10 reps - Seated Wrist Extension with Dumbbell  - 1 x daily - 7 x weekly - 1 sets - 10 reps - Seated Pronation Supination with Dumbbell  - 1 x daily - 7 x weekly - 1 sets - 10 reps - Seated Wrist Ulnar Deviation with Dumbbell  - 1 x daily - 7 x weekly - 1 sets - 10 reps - Seated Wrist Radial Deviation with Dumbbell  - 1 x daily - 7 x weekly - 1 sets - 10 reps - Seated Single Arm Bicep Curls Neutral with Dumbbell (Mirrored)  - 1 x daily - 7 x weekly - 1 sets - 10 reps -  Seated Elbow Extension with Self-Anchored Resistance  - 1 x daily - 7 x weekly - 1 sets - 10 reps - Standing Single Arm Low Row with Anchored Resistance  - 1 x daily - 7 x weekly  - 1 sets - 10 reps - Single Arm Shoulder Extension with Anchored Resistance  - 1 x daily - 7 x weekly - 1 sets - 10 reps - Thumb Radial Abduction with Rubber Band - Palm Down  - 1 x daily - 7 x weekly - 3 sets - 10 reps - Thumb Flexion with Resistance  - 1 x daily - 7 x weekly - 3 sets - 10 reps - Seated Thumb Extension with Resistance  - 1 x daily - 7 x weekly - 3 sets - 10 reps - Tip Pinch with Putty  - 1 x daily - 7 x weekly - 1 sets - 10 reps - Finger Lumbricals with Putty  - 1 x daily - 7 x weekly - 1 sets - 10 reps - Finger Pinch and Pull with Putty  - 1 x daily - 7 x weekly - 1 sets - 10 reps - Seated Finger Extension with Putty  - 1 x daily - 7 x weekly - 1 sets - 10 reps - Thumb MCP and IP Flexion with Putty  - 1 x daily - 7 x weekly - 1 sets - 10 reps - Seated Finger Composite Flexion with Putty  - 1 x daily - 7 x weekly - 1 sets - 10 reps - Finger Pinch and Pull with Putty  - 1 x daily - 7 x weekly - 1 sets - 10 reps - Seated Claw Fist with Putty  - 1 x daily - 7 x weekly - 1 sets - 10 reps ASSESSMENT:  CLINICAL IMPRESSION: The patient reports mild increase in hand pain following putty ex's therefore we discussed limiting volume for home performance.  She is able to lift a half cup full of coffee at home with ease but a full cup is difficult.  Follow up weekly to finalize HEP and strategies for self management.  Therapist monitoring response to all exercises and modifying accordingly.     OBJECTIVE IMPAIRMENTS: decreased activity tolerance, decreased ROM, decreased strength, impaired perceived functional ability, impaired UE functional use, and pain.   ACTIVITY LIMITATIONS: carrying, lifting, and playing the piano professionally  PARTICIPATION LIMITATIONS: meal prep and occupation  PERSONAL FACTORS: previous history of tendontitis are also affecting patient's functional outcome.   REHAB POTENTIAL: Good  CLINICAL DECISION MAKING: Stable/uncomplicated  EVALUATION COMPLEXITY:  Low  GOALS: Goals reviewed with patient? Yes  SHORT TERM GOALS: Target date: 09/01/2023   The patient will demonstrate knowledge of basic self care strategies and exercises to promote healing  Baseline: Goal status:MET 9/12  2.  Right wrist strength improved to 4/5 needed for lifting and carrying objects in the kitchen Baseline:  Goal status: ONGOING 9/12  3.  Improved wrist ROM: flexion to 64 degrees, extension to 50 degrees needed for playing the piano Baseline:  Goal status: INITIAL  4.  The patient will have grip and finger strength improved to lift a 2# mug in/out of the microwave Baseline:  Goal status: ONGOING 9/12   LONG TERM GOALS: Target date: 09/29/2023   The patient will be independent in a safe self progression of a home exercise program to promote further recovery of function   Baseline:  Goal status: INITIAL  2.  The patient will report an improvement  in pain hand/wrist mobility needed for playing the piano with PSFS rating improved to 9 Baseline:  Goal status: INITIAL  3.  The patient will report an improvement with cooking including twisting and lifting a cup to the microwave with PSFS rating improved to 9 Baseline:  Goal status: INITIAL  4.  Right grip strength improved to 29# and pincher strength improved to 6# Baseline:  Goal status: INITIAL  5.  The patient will have improved FOTO score to    72%   indicating improved function with less pain  Baseline:  Goal status: INITIAL   PLAN: PT FREQUENCY: 1-2x/week  PT DURATION: 8 weeks  PLANNED INTERVENTIONS: Therapeutic exercises, Therapeutic activity, Neuromuscular re-education, Patient/Family education, Self Care, Joint mobilization, Dry Needling, Electrical stimulation, Cryotherapy, Moist heat, Taping, Ultrasound, Ionotophoresis 4mg /ml Dexamethasone, Manual therapy, and Re-evaluation  PLAN FOR NEXT SESSION: ask about cardboard for band at home; check wrist ROM for STG;  try lifting weight  in/out of cabinet shelf;  Assess HEP and tolerance to change in frequency of treatment sessions   Lavinia Sharps, PT 09/08/23 2:55 PM Phone: [phone] Fax: [phone]

## 2023-09-14 DIAGNOSIS — E785 Hyperlipidemia, unspecified: Secondary | ICD-10-CM | POA: Diagnosis not present

## 2023-09-14 DIAGNOSIS — Z23 Encounter for immunization: Secondary | ICD-10-CM | POA: Diagnosis not present

## 2023-09-14 DIAGNOSIS — G479 Sleep disorder, unspecified: Secondary | ICD-10-CM | POA: Diagnosis not present

## 2023-09-14 DIAGNOSIS — H6121 Impacted cerumen, right ear: Secondary | ICD-10-CM | POA: Diagnosis not present

## 2023-09-14 DIAGNOSIS — M810 Age-related osteoporosis without current pathological fracture: Secondary | ICD-10-CM | POA: Diagnosis not present

## 2023-09-14 DIAGNOSIS — E559 Vitamin D deficiency, unspecified: Secondary | ICD-10-CM | POA: Diagnosis not present

## 2023-09-14 DIAGNOSIS — Z6832 Body mass index (BMI) 32.0-32.9, adult: Secondary | ICD-10-CM | POA: Diagnosis not present

## 2023-09-14 DIAGNOSIS — Z0000 Encounter for general adult medical examination without abnormal findings: Secondary | ICD-10-CM | POA: Diagnosis not present

## 2023-09-14 DIAGNOSIS — R7303 Prediabetes: Secondary | ICD-10-CM | POA: Diagnosis not present

## 2023-09-14 DIAGNOSIS — E039 Hypothyroidism, unspecified: Secondary | ICD-10-CM | POA: Diagnosis not present

## 2023-09-15 ENCOUNTER — Ambulatory Visit: Payer: PPO | Attending: Family Medicine | Admitting: Physical Therapy

## 2023-09-15 DIAGNOSIS — M25531 Pain in right wrist: Secondary | ICD-10-CM | POA: Diagnosis not present

## 2023-09-15 DIAGNOSIS — M79644 Pain in right finger(s): Secondary | ICD-10-CM | POA: Diagnosis not present

## 2023-09-15 DIAGNOSIS — M79631 Pain in right forearm: Secondary | ICD-10-CM | POA: Insufficient documentation

## 2023-09-15 DIAGNOSIS — M6281 Muscle weakness (generalized): Secondary | ICD-10-CM | POA: Diagnosis not present
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
## 2023-09-22 ENCOUNTER — Ambulatory Visit: Payer: PPO | Admitting: Physical Therapy

## 2023-09-22 DIAGNOSIS — M6281 Muscle weakness (generalized): Secondary | ICD-10-CM

## 2023-09-22 DIAGNOSIS — M25531 Pain in right wrist: Secondary | ICD-10-CM

## 2023-09-22 DIAGNOSIS — M79644 Pain in right finger(s): Secondary | ICD-10-CM

## 2023-09-22 NOTE — Therapy (Signed)
OUTPATIENT PHYSICAL THERAPY UPPER EXTREMITY PROGRESS NOTE   Patient Name: Kimberly Ryan MRN: [id number] DOB:1951/08/22, 72 y.o., female Today's Date: 09/22/2023  END OF SESSION:  PT End of Session - 09/22/23 1104     Visit Number 8    Date for PT Re-Evaluation 09/29/23    Authorization Type [employer]    Progress Note Due on Visit 10    PT Start Time 1105    PT Stop Time 1145    PT Time Calculation (min) 40 min    Activity Tolerance Patient tolerated treatment well                Past Medical History:  Diagnosis Date   Anxiety    Arthritis    GERD (gastroesophageal reflux disease)    Headache(784.0)    Shortness of breath    with exertion    Thyroid disease    Past Surgical History:  Procedure Laterality Date   MYOMECTOMY  2000   RHINOPLASTY  1953   THYROID LOBECTOMY  08/17/2012   Procedure: THYROID LOBECTOMY;  Surgeon: Atilano Ina, MD,FACS;  Location: [facility];  Service: General;  Laterality: Left;  Left Thyroid Lobectomy   TONSILLECTOMY  1954   Patient Active Problem List   Diagnosis Date Noted   OP (osteoporosis) 01/06/2023   Postsurgical hypothyroidism 11/04/2012   Multiple thyroid nodules 12/19/2011    PCP: Inez Pilgrim NP  REFERRING PROVIDER: Inez Pilgrim NP  REFERRING DIAG: M25.531 Wrist pain, right   THERAPY DIAG:  Right wrist pain  Rationale for Evaluation and Treatment: Rehabilitation  ONSET DATE: 6 weeks ago  SUBJECTIVE:                                                                                                                                                                                      SUBJECTIVE STATEMENT: We were able to go to [place] for our trip.  Some days with my hands have been good; get sore sometimes but as I do things I get better.   I can lift my coffee cup down better now.  Some difficulty buckling my seatbelt and twisting/rotating.     Hand dominance: Right  PERTINENT HISTORY: Pt is  a Production manager Had PT in past for hand/finger tendonitis with Loistine Simas History many years ago of right wrist fracture (no hardware) Osteopenia (bone density test 3 weeks ago improved on 1 hip, worse on the other) Glucosamine/chondroitin Tried previous HEP 1# wrist ex's but seemed to worsen  PAIN:  PAIN:  Are you having pain? Not painful at the moment NPRS scale: 0/10 Pain location: intermittent right pain 4th finger and radial  wrist Aggravating factors: AM stiffness;  playing the piano; wrist radial deviation, pronation; lifting a cup to the microwave Relieving factors: grape juice with pectin ([facility]); ibuprofen  PRECAUTIONS: None     WEIGHT BEARING RESTRICTIONS: No  FALLS:  Has patient fallen in last 6 months? No  OCCUPATION: Production manager  PLOF: Independent  PATIENT GOALS: strengthening; easing of the pain   OBJECTIVE:   PATIENT SURVEYS :  FOTO 65%  COGNITION: Overall cognitive status: Within functional limits for tasks assessed      The Patient-Specific Functional Scale  Initial:  I am going to ask you to identify up to 3 important activities that you are unable to do or are having difficulty with as a result of this problem.  Today are there any activities that you are unable to do or having difficulty with because of this?  (Patient shown scale and patient rated each activity)  Follow up: When you first came in you had difficulty performing these activities.  Today do you still have difficulty?  Patient-Specific activity scoring scheme (Point to one number):  0 1 2 3 4 5 6 7 8 9  10 Unable                                                                                                          Able to perform To perform                                                                                                    activity at the same Activity         Level as before                                                                                                                        Injury or problem  Activity           Playing piano  Initial:          7               2.               Cooking, putting something in microwave/twisting wrist              Initial:    7                                      APPEARANCE: minimal edema or bony enlargement  UPPER EXTREMITY ROM:  Decreased DIP and PIP flexion on right   Active ROM Right eval Left eval 10/8  Shoulder flexion  full   Shoulder extension     Shoulder abduction  full   Shoulder adduction     Shoulder internal rotation  full   Shoulder external rotation  full   Elbow flexion  full   Elbow extension 0 0   Wrist flexion 58 66 R 58  Wrist extension 45 65 R 55  Wrist ulnar deviation 20 28 R 25  Wrist radial deviation 18 22 R 34  Wrist pronation     Wrist supination 80 105 R 85  (Blank rows = not tested)  UPPER EXTREMITY MMT:  MMT Right eval Left eval Right  10/8  Shoulder flexion     Shoulder extension     Shoulder abduction     Shoulder adduction     Shoulder internal rotation     Shoulder external rotation     Middle trapezius     Lower trapezius     Elbow flexion 5 5   Elbow extension 5 5   Wrist flexion 4- 5   Wrist extension 4- 5   Wrist ulnar deviation 4- 5   Wrist radial deviation 4- 5   Wrist pronation 4- 5   Wrist supination 4-  5   Grip strength (lbs) 19 35 R 25  (Blank rows = not tested) Pincher strength thumb and index:  right 5#; left 6.5#;   10/8:  10# thumb to index finger right Pincher strength thumb and 4th finger:  rigth 2.5#; left 6# 10/8:  6.5# Decreased right hand intrinsic strength 4-/5; finger adductors 3/5   TODAY'S TREATMENT:       10/8: UBE 2 forward/2 backward Simulation of buckling the seatbelt: cable pulley 10x, then dumbbell on red power cord 10x2 Velcro board pronation/supination with pincher grip 8x pain  2-3/10 Wrist ROM Grip and pinch strength recheck and review of progress since start of care Red band wrist flexion then pronation 10x Red band wrist extension then supination 10x  10/1: UBE 2 forward/2 backward DIP, DIP flexion  with blocking 10x each Rt thumb in fist x 10  Rt thumb to pinky opposition x 10  Rt thumb to each finger x 5 1-2 sec hold Chain Pull Isometric x5  Radial/ulnar deviation 10x 2#  (2/10 pain) Flex grip with rubber circle 2x10 - PT encouraged CMC mobility with thumb toward pinky and 4thwith squeeze 2lb Rt wrist strength all directions x 10 (flexion/extension; pronation/ supination) 2/10 pain Biceps Curl 5# x 10 ( with supination) 2/10 pain Standing functional push press 5# 2 sets of 5 Triceps Extension green band 2 x 10 Wall slides with towel 10x Standing Lat bar 25# 10x; 30# 10x  DATE:  9/24: Yellow extra soft putty per HEP below:  recommend limiting to 8 min at a time for home (pt given putty for home use) Education on hand anatomy: muscles, tendons of the hand Biceps Curl 5# x 10 Triceps Extension green band x 10 Review of HEP and frequency/dosage  9/17: Moist Heat to Rt thumb at beginning and end of treatment session Rt thumb ext and abd A/ROM 1x10 each, then 1x10 with medium rubber band resistance Rt thumb in fist x 10  Rt thumb to pinky opposition x 10  Rt thumb to each finger x 10 1-2 sec hold Chain Pull Isometric x5 Blocked thumb CMC flexion x10 Flex grip with rubber circle 2x10 - PT encouraged CMC mobility with thumb toward pinky with squeeze 1lb Rt wrist strength all directions x 10 (flexion/extension; pronation/ supination) Biceps Curl 4# x 10 Triceps Extension 5lb at [employer] x 10 Y's at wall w/ green loop around wrists x 10 Wall Push Ups x 10  9/12: UBE L2 2x2 PT present to discuss status 2lb Rt UE reach  taps to overhead cabinets x10 each Flex grip with rubber circle 2x10 - PT encouraged CMC mobility with thumb toward pinky with squeeze Seated wrist extension over towel with hand opening to "high five position" x 10 Thumb/pinky wall walk "inch worm" for thumb opposition and abduction Rt thumb ext and abd A/ROM 1x10, then 1x10 with medium rubber band resistance 1lb Rt wrist strength all directions x 10 Hold end of 1lb dumbbell and circles each way x 5    PATIENT EDUCATION: Education details: Educated patient on anatomy and physiology of current symptoms, prognosis, plan of care as well as initial self care strategies to promote recovery;Discussed paraffin baths Light Theraputty  Person educated: Patient Education method: Explanation Education comprehension: verbalized understanding  HOME EXERCISE PROGRAM: Access Code: [email or web address] URL: [email or web address] Date: 09/08/2023 Prepared by: Lavinia Sharps  Exercises - Finger Abduction with Rubber Band  - 1 x daily - 7 x weekly - 2 sets - 10 reps - Seated Finger Flexion with Resistance  - 1 x daily - 7 x weekly - 2 sets - 10 reps - Seated Finger Adduction and Extension with Towel  - 1 x daily - 7 x weekly - 2 sets - 10 reps - Seated Wrist Flexion with Dumbbell  - 1 x daily - 7 x weekly - 1 sets - 10 reps - Seated Wrist Extension with Dumbbell  - 1 x daily - 7 x weekly - 1 sets - 10 reps - Seated Pronation Supination with Dumbbell  - 1 x daily - 7 x weekly - 1 sets - 10 reps - Seated Wrist Ulnar Deviation with Dumbbell  - 1 x daily - 7 x weekly - 1 sets - 10 reps - Seated Wrist Radial Deviation with Dumbbell  - 1 x daily - 7 x weekly - 1 sets - 10 reps - Seated Single Arm Bicep Curls Neutral with Dumbbell (Mirrored)  - 1 x daily - 7 x weekly - 1 sets - 10 reps - Seated Elbow Extension with Self-Anchored Resistance  - 1 x daily - 7 x weekly - 1 sets - 10 reps - Standing Single Arm Low Row with Anchored Resistance  - 1 x daily - 7 x  weekly - 1 sets - 10 reps - Single Arm Shoulder Extension with Anchored Resistance  - 1 x daily - 7 x weekly - 1 sets - 10 reps - Thumb Radial Abduction with  Rubber Band - Palm Down  - 1 x daily - 7 x weekly - 3 sets - 10 reps - Thumb Flexion with Resistance  - 1 x daily - 7 x weekly - 3 sets - 10 reps - Seated Thumb Extension with Resistance  - 1 x daily - 7 x weekly - 3 sets - 10 reps - Tip Pinch with Putty  - 1 x daily - 7 x weekly - 1 sets - 10 reps - Finger Lumbricals with Putty  - 1 x daily - 7 x weekly - 1 sets - 10 reps - Finger Pinch and Pull with Putty  - 1 x daily - 7 x weekly - 1 sets - 10 reps - Seated Finger Extension with Putty  - 1 x daily - 7 x weekly - 1 sets - 10 reps - Thumb MCP and IP Flexion with Putty  - 1 x daily - 7 x weekly - 1 sets - 10 reps - Seated Finger Composite Flexion with Putty  - 1 x daily - 7 x weekly - 1 sets - 10 reps - Finger Pinch and Pull with Putty  - 1 x daily - 7 x weekly - 1 sets - 10 reps - Seated Claw Fist with Putty  - 1 x daily - 7 x weekly - 1 sets - 10 reps ASSESSMENT:  CLINICAL IMPRESSION: Significant improvements in wrist ROM, gross grip and pincher strength since start of care.  She also reports functional improvement with lifting a mug out of the microwave but has pain/difficulty with buckling her seat belt and pronation/supination tasks.  Her pain level during the session increases to 2-3/10 level but resolves quickly.  Will continue to encourage regular performance of a progressive HEP for further improvements in function and pain.     OBJECTIVE IMPAIRMENTS: decreased activity tolerance, decreased ROM, decreased strength, impaired perceived functional ability, impaired UE functional use, and pain.   ACTIVITY LIMITATIONS: carrying, lifting, and playing the piano professionally  PARTICIPATION LIMITATIONS: meal prep and occupation  PERSONAL FACTORS: previous history of tendontitis are also affecting patient's functional outcome.    REHAB POTENTIAL: Good  CLINICAL DECISION MAKING: Stable/uncomplicated  EVALUATION COMPLEXITY: Low  GOALS: Goals reviewed with patient? Yes  SHORT TERM GOALS: Target date: 09/01/2023   The patient will demonstrate knowledge of basic self care strategies and exercises to promote healing  Baseline: Goal status:MET 9/12  2.  Right wrist strength improved to 4/5 needed for lifting and carrying objects in the kitchen Baseline:  Goal status: met 10/8  3.  Improved wrist ROM: flexion to 64 degrees, extension to 50 degrees needed for playing the piano Baseline:  Goal status: partially met  4.  The patient will have grip and finger strength improved to lift a 2# mug in/out of the microwave Baseline:  Goal status: ONGOING 9/12   LONG TERM GOALS: Target date: 09/29/2023   The patient will be independent in a safe self progression of a home exercise program to promote further recovery of function   Baseline:  Goal status: ongoing 2.  The patient will report an improvement in pain hand/wrist mobility needed for playing the piano with PSFS rating improved to 9 Baseline:  Goal status: ongoing  3.  The patient will report an improvement with cooking including twisting and lifting a cup to the microwave with PSFS rating improved to 9 Baseline:  Goal status: ongoing  4.  Right grip strength improved to 29# and pincher strength improved to 6#  Baseline:  Goal status: partially met  5.  The patient will have improved FOTO score to    72%   indicating improved function with less pain  Baseline:  Goal status: ongoing   PLAN: PT FREQUENCY: 1-2x/week  PT DURATION: 8 weeks  PLANNED INTERVENTIONS: Therapeutic exercises, Therapeutic activity, Neuromuscular re-education, Patient/Family education, Self Care, Joint mobilization, Dry Needling, Electrical stimulation, Cryotherapy, Moist heat, Taping, Ultrasound, Ionotophoresis 4mg /ml Dexamethasone, Manual therapy, and  Re-evaluation  PLAN FOR NEXT SESSION: FOTO and progress with PSFS activities;  check remaining goals and determine if ready for discharge or if a tapered schedule of visits to promote independence with HEP   Lavinia Sharps, PT 09/22/23 2:19 PM Phone: [phone] Fax: [phone]

## 2023-09-28 ENCOUNTER — Encounter: Payer: Self-pay | Admitting: Physical Therapy

## 2023-09-28 ENCOUNTER — Ambulatory Visit: Payer: PPO | Admitting: Physical Therapy

## 2023-09-28 DIAGNOSIS — M79631 Pain in right forearm: Secondary | ICD-10-CM

## 2023-09-28 DIAGNOSIS — M6281 Muscle weakness (generalized): Secondary | ICD-10-CM

## 2023-09-28 DIAGNOSIS — M79644 Pain in right finger(s): Secondary | ICD-10-CM

## 2023-09-28 DIAGNOSIS — M25531 Pain in right wrist: Secondary | ICD-10-CM

## 2023-09-28 NOTE — Therapy (Signed)
OUTPATIENT PHYSICAL THERAPY UPPER EXTREMITY PROGRESS NOTE   Patient Name: Kimberly Ryan MRN: [id number] DOB:Aug 10, 1951, 72 y.o., female Today's Date: 09/28/2023  END OF SESSION:  PT End of Session - 09/28/23 1111     Visit Number 9    Date for PT Re-Evaluation 11/30/23    Authorization Type [employer]    Progress Note Due on Visit 10    PT Start Time 1105    PT Stop Time 1145    PT Time Calculation (min) 40 min    Activity Tolerance Patient tolerated treatment well    Behavior During Therapy WFL for tasks assessed/performed                 Past Medical History:  Diagnosis Date   Anxiety    Arthritis    GERD (gastroesophageal reflux disease)    Headache(784.0)    Shortness of breath    with exertion    Thyroid disease    Past Surgical History:  Procedure Laterality Date   MYOMECTOMY  2000   RHINOPLASTY  1953   THYROID LOBECTOMY  08/17/2012   Procedure: THYROID LOBECTOMY;  Surgeon: Atilano Ina, MD,FACS;  Location: [facility];  Service: General;  Laterality: Left;  Left Thyroid Lobectomy   TONSILLECTOMY  1954   Patient Active Problem List   Diagnosis Date Noted   OP (osteoporosis) 01/06/2023   Postsurgical hypothyroidism 11/04/2012   Multiple thyroid nodules 12/19/2011    PCP: Inez Pilgrim NP  REFERRING PROVIDER: Inez Pilgrim NP  REFERRING DIAG: M25.531 Wrist pain, right   THERAPY DIAG:  Right wrist pain  Rationale for Evaluation and Treatment: Rehabilitation  ONSET DATE: 6 weeks ago  SUBJECTIVE:                                                                                                                                                                                      SUBJECTIVE STATEMENT: The pain is still there but it is spotty.  I have times where I don't feel pain at all.  I am about 25% better.  I feel like I am ready to taper my PT but not ready for discharge yet.   Transfering tea cup and seatbelt donning is much  improved. Piano playing is improving - still a twinge of pain with big reaches across keys. Continued pain with any rotation activities or loading wrist with carrying. I think holding my phone and playing games on it contributes to my pain - I can feel that pain on the Lt as well when doing this activity.   Hand dominance: Right  PERTINENT HISTORY: Pt is a Production manager Had PT  in past for hand/finger tendonitis with Loistine Simas History many years ago of right wrist fracture (no hardware) Osteopenia (bone density test 3 weeks ago improved on 1 hip, worse on the other) Glucosamine/chondroitin Tried previous HEP 1# wrist ex's but seemed to worsen  PAIN:  PAIN:  Are you having pain? Not painful at the moment NPRS scale: 0-4/10 Pain location: intermittent right pain 4th finger and radial wrist Aggravating factors: AM stiffness;  playing the piano; wrist radial deviation, pronation; lifting a cup to the microwave Relieving factors: grape juice with pectin ([facility]); ibuprofen  PRECAUTIONS: None     WEIGHT BEARING RESTRICTIONS: No  FALLS:  Has patient fallen in last 6 months? No  OCCUPATION: Production manager  PLOF: Independent  PATIENT GOALS: strengthening; easing of the pain   OBJECTIVE:   PATIENT SURVEYS :   09/28/23: FOTO: 67%  Eval:  FOTO 65%  COGNITION: Overall cognitive status: Within functional limits for tasks assessed      The Patient-Specific Functional Scale  Initial:  I am going to ask you to identify up to 3 important activities that you are unable to do or are having difficulty with as a result of this problem.  Today are there any activities that you are unable to do or having difficulty with because of this?  (Patient shown scale and patient rated each activity)  Follow up: When you first came in you had difficulty performing these activities.  Today do you still have difficulty?  Patient-Specific activity  scoring scheme (Point to one number):  0 1 2 3 4 5 6 7 8 9  10 Unable                                                                                                          Able to perform To perform                                                                                                    activity at the same Activity         Level as before  Injury or problem  Activity           Playing piano                                                                      Initial:          7  (eval), 10/14:   8  2.               Cooking, putting something in microwave/twisting wrist              Initial:    7  (eval), 10/14:  8                               APPEARANCE: minimal edema or bony enlargement  UPPER EXTREMITY ROM:  Decreased DIP and PIP flexion on right   Active ROM Right eval Left eval 10/8  Shoulder flexion  full   Shoulder extension     Shoulder abduction  full   Shoulder adduction     Shoulder internal rotation  full   Shoulder external rotation  full   Elbow flexion  full   Elbow extension 0 0   Wrist flexion 58 66 R 58  Wrist extension 45 65 R 55  Wrist ulnar deviation 20 28 R 25  Wrist radial deviation 18 22 R 34  Wrist pronation     Wrist supination 80 105 R 85  (Blank rows = not tested)  UPPER EXTREMITY MMT:  MMT Right eval Left eval Right  10/8  Shoulder flexion     Shoulder extension     Shoulder abduction     Shoulder adduction     Shoulder internal rotation     Shoulder external rotation     Middle trapezius     Lower trapezius     Elbow flexion 5 5   Elbow extension 5 5   Wrist flexion 4- 5   Wrist extension 4- 5   Wrist ulnar deviation 4- 5   Wrist radial deviation 4- 5   Wrist pronation 4- 5   Wrist supination 4-  5   Grip strength (lbs) 19 35 R 25  (Blank rows = not tested) Pincher strength thumb and index:   right 5#; left 6.5#;   10/8:  10# thumb to index finger right Pincher strength thumb and 4th finger:  rigth 2.5#; left 6# 10/8:  6.5# Decreased right hand intrinsic strength 4-/5; finger adductors 3/5   TODAY'S TREATMENT:       09/28/23: FOTO, PSFS, goal review UBE L2 x2 fwd, L3x2 bwd PT present to discuss status and progress Rt wrist and finger warm up: circles bil x10 each way, finger and thumb abd/add 8x3" holds Velcro board pronation/supination with pincer grip 8x pain 2/10 with supination to pronation 2lb Rt wrist supination/pronation, radial deviation, ulnar deviation x12 each Small blue weighted ball self-toss catch standing with elbow bent in supination (pain 3-4/10) Standing Rt UE red tband tricep ext, shoulder extension x 15 each Simulated seat belt diagonal red tband x15, simulated Rt UE seat belt press to floor x15 red tband Seated flexion/pronation x10 red tband  10/8: UBE 2 forward/2 backward Simulation  of buckling the seatbelt: cable pulley 10x, then dumbbell on red power cord 10x2 Velcro board pronation/supination with pincher grip 8x pain 2-3/10 Wrist ROM Grip and pinch strength recheck and review of progress since start of care Red band wrist flexion then pronation 10x Red band wrist extension then supination 10x  10/1: UBE 2 forward/2 backward DIP, DIP flexion  with blocking 10x each Rt thumb in fist x 10  Rt thumb to pinky opposition x 10  Rt thumb to each finger x 5 1-2 sec hold Chain Pull Isometric x5  Radial/ulnar deviation 10x 2#  (2/10 pain) Flex grip with rubber circle 2x10 - PT encouraged CMC mobility with thumb toward pinky and 4thwith squeeze 2lb Rt wrist strength all directions x 10 (flexion/extension; pronation/ supination) 2/10 pain Biceps Curl 5# x 10 ( with supination) 2/10 pain Standing functional push press 5# 2 sets of 5 Triceps Extension green band 2 x 10 Wall slides with towel 10x Standing Lat bar 25# 10x; 30# 10x         PATIENT  EDUCATION: Education details: Educated patient on anatomy and physiology of current symptoms, prognosis, plan of care as well as initial self care strategies to promote recovery;Discussed paraffin baths Light Theraputty  Person educated: Patient Education method: Explanation Education comprehension: verbalized understanding  HOME EXERCISE PROGRAM: Access Code: [email or web address] URL: [email or web address] Date: 09/08/2023 Prepared by: Lavinia Sharps  Exercises - Finger Abduction with Rubber Band  - 1 x daily - 7 x weekly - 2 sets - 10 reps - Seated Finger Flexion with Resistance  - 1 x daily - 7 x weekly - 2 sets - 10 reps - Seated Finger Adduction and Extension with Towel  - 1 x daily - 7 x weekly - 2 sets - 10 reps - Seated Wrist Flexion with Dumbbell  - 1 x daily - 7 x weekly - 1 sets - 10 reps - Seated Wrist Extension with Dumbbell  - 1 x daily - 7 x weekly - 1 sets - 10 reps - Seated Pronation Supination with Dumbbell  - 1 x daily - 7 x weekly - 1 sets - 10 reps - Seated Wrist Ulnar Deviation with Dumbbell  - 1 x daily - 7 x weekly - 1 sets - 10 reps - Seated Wrist Radial Deviation with Dumbbell  - 1 x daily - 7 x weekly - 1 sets - 10 reps - Seated Single Arm Bicep Curls Neutral with Dumbbell (Mirrored)  - 1 x daily - 7 x weekly - 1 sets - 10 reps - Seated Elbow Extension with Self-Anchored Resistance  - 1 x daily - 7 x weekly - 1 sets - 10 reps - Standing Single Arm Low Row with Anchored Resistance  - 1 x daily - 7 x weekly - 1 sets - 10 reps - Single Arm Shoulder Extension with Anchored Resistance  - 1 x daily - 7 x weekly - 1 sets - 10 reps - Thumb Radial Abduction with Rubber Band - Palm Down  - 1 x daily - 7 x weekly - 3 sets - 10 reps - Thumb Flexion with Resistance  - 1 x daily - 7 x weekly - 3 sets - 10 reps - Seated Thumb Extension with Resistance  - 1 x daily - 7 x weekly - 3 sets - 10 reps - Tip Pinch with Putty  - 1 x daily - 7 x weekly - 1 sets - 10 reps - Finger  Lumbricals with Putty  -  1 x daily - 7 x weekly - 1 sets - 10 reps - Finger Pinch and Pull with Putty  - 1 x daily - 7 x weekly - 1 sets - 10 reps - Seated Finger Extension with Putty  - 1 x daily - 7 x weekly - 1 sets - 10 reps - Thumb MCP and IP Flexion with Putty  - 1 x daily - 7 x weekly - 1 sets - 10 reps - Seated Finger Composite Flexion with Putty  - 1 x daily - 7 x weekly - 1 sets - 10 reps - Finger Pinch and Pull with Putty  - 1 x daily - 7 x weekly - 1 sets - 10 reps - Seated Claw Fist with Putty  - 1 x daily - 7 x weekly - 1 sets - 10 reps ASSESSMENT:  CLINICAL IMPRESSION: Significant improvements in wrist ROM, gross grip and pincher strength since start of care.  She also reports functional improvement with lifting a mug out of the microwave and fastening her seat belt.  She continues to have some mild pain with reaching across keys of piano, holding her phone, and loaded twisting of forearm for functional tasks.  Overall Pt reports 25% improvement and feels that the exercises are very helpful.  She shows marginal improvement by 2% in FOTO score, from 65% to 67% today.  She rates improvement from "7" to "8" for both PSFS tasks (piano, cooking/twisting wrist tasks. She was able to perform wrist strength today with 2lb demo'ing improved strength. Her pain level during the session increases to 2-3/10 level but resolves quickly.  We will taper schedule to every other week for 4 more visits to promote more independence with HEP.    OBJECTIVE IMPAIRMENTS: decreased activity tolerance, decreased ROM, decreased strength, impaired perceived functional ability, impaired UE functional use, and pain.   ACTIVITY LIMITATIONS: carrying, lifting, and playing the piano professionally  PARTICIPATION LIMITATIONS: meal prep and occupation  PERSONAL FACTORS: previous history of tendontitis are also affecting patient's functional outcome.   REHAB POTENTIAL: Good  CLINICAL DECISION MAKING:  Stable/uncomplicated  EVALUATION COMPLEXITY: Low  GOALS: Goals reviewed with patient? Yes  SHORT TERM GOALS: Target date: 09/01/2023   The patient will demonstrate knowledge of basic self care strategies and exercises to promote healing  Baseline: Goal status:MET 9/12  2.  Right wrist strength improved to 4/5 needed for lifting and carrying objects in the kitchen Baseline:  Goal status: met 10/8  3.  Improved wrist ROM: flexion to 64 degrees, extension to 50 degrees needed for playing the piano Baseline:  Goal status: MET 10/8  4.  The patient will have grip and finger strength improved to lift a 2# mug in/out of the microwave Baseline:  Goal status: MET 10/8   LONG TERM GOALS: Target date: 09/29/2023   The patient will be independent in a safe self progression of a home exercise program to promote further recovery of function   Baseline:  Goal status: ongoing 10/14  2.  The patient will report an improvement in pain hand/wrist mobility needed for playing the piano with PSFS rating improved to 9 Baseline:  Goal status: ongoing, REACHED 8/10 FOR BOTH ACTIVITIES ON 10/14  3.  The patient will report an improvement with cooking including twisting and lifting a cup to the microwave with PSFS rating improved to 9 Baseline:  Goal status: ongoing 8/10 ON 10/14  4.  Right grip strength improved to 29# and pincher strength improved to 6#  Baseline:  Goal status: partially met FOR GRIP, MET FOR PINCER 10/14  5.  The patient will have improved FOTO score to    72%   indicating improved function with less pain  Baseline:  Goal status: ongoing 67% ON 10/14   PLAN: PT FREQUENCY: every other week  PT DURATION: 8 weeks  PLANNED INTERVENTIONS: Therapeutic exercises, Therapeutic activity, Neuromuscular re-education, Patient/Family education, Self Care, Joint mobilization, Dry Needling, Electrical stimulation, Cryotherapy, Moist heat, Taping, Ultrasound, Ionotophoresis 4mg /ml  Dexamethasone, Manual therapy, and Re-evaluation  PLAN FOR NEXT SESSION: progress with PSFS activities, wrist, hand intrinsic and functional strength of Rt UE, focus on joint protection as needed   Morton Peters, PT 09/28/23 12:47 PM  Phone: [phone] Fax: [phone]

## 2023-10-13 ENCOUNTER — Ambulatory Visit: Payer: PPO | Admitting: Physical Therapy

## 2023-10-13 DIAGNOSIS — M6281 Muscle weakness (generalized): Secondary | ICD-10-CM | POA: Diagnosis not present

## 2023-10-13 DIAGNOSIS — M25531 Pain in right wrist: Secondary | ICD-10-CM

## 2023-10-13 DIAGNOSIS — M79644 Pain in right finger(s): Secondary | ICD-10-CM

## 2023-10-13 NOTE — Therapy (Addendum)
OUTPATIENT PHYSICAL THERAPY UPPER EXTREMITY PROGRESS NOTE   Patient Name: Kimberly Ryan MRN: [id number] DOB:July 12, 1951, 72 y.o., female Today's Date: 10/13/2023   Progress Note Reporting Period 8/20 to 10/13/23  See note below for Objective Data and Assessment of Progress/Goals.     END OF SESSION:  PT End of Session - 10/13/23 1402     Visit Number 10    Date for PT Re-Evaluation 11/30/23    Authorization Type [employer]    Progress Note Due on Visit 20    PT Start Time 1402    PT Stop Time 1440    PT Time Calculation (min) 38 min    Activity Tolerance Patient tolerated treatment well                 Past Medical History:  Diagnosis Date   Anxiety    Arthritis    GERD (gastroesophageal reflux disease)    Headache(784.0)    Shortness of breath    with exertion    Thyroid disease    Past Surgical History:  Procedure Laterality Date   MYOMECTOMY  2000   RHINOPLASTY  1953   THYROID LOBECTOMY  08/17/2012   Procedure: THYROID LOBECTOMY;  Surgeon: Atilano Ina, MD,FACS;  Location: [facility];  Service: General;  Laterality: Left;  Left Thyroid Lobectomy   TONSILLECTOMY  1954   Patient Active Problem List   Diagnosis Date Noted   OP (osteoporosis) 01/06/2023   Postsurgical hypothyroidism 11/04/2012   Multiple thyroid nodules 12/19/2011    PCP: Inez Pilgrim NP  REFERRING PROVIDER: Inez Pilgrim NP  REFERRING DIAG: M25.531 Wrist pain, right   THERAPY DIAG:  Right wrist pain  Rationale for Evaluation and Treatment: Rehabilitation  ONSET DATE: 6 weeks ago  SUBJECTIVE:                                                                                                                                                                                      SUBJECTIVE STATEMENT: I'm about the same.  I think the pain in my wrist is arthritis.  I can do most things.  Worse in the AM but now it doesn't feel bad at all.  Seat belt getting easier. I  lost the ability to snap with my right fingers.    Hand dominance: Right  PERTINENT HISTORY: Pt is a Production manager Had PT in past for hand/finger tendonitis with Loistine Simas History many years ago of right wrist fracture (no hardware) Osteopenia (bone density test 3 weeks ago improved on 1 hip, worse on the other) Glucosamine/chondroitin Tried previous HEP 1# wrist ex's but seemed to worsen  PAIN:  PAIN:  Are you having pain? Not painful at the moment NPRS scale: 0-4/10 Pain location: intermittent right pain 4th finger and radial wrist Aggravating factors: AM stiffness;  playing the piano; wrist radial deviation, pronation; lifting a cup to the microwave Relieving factors: grape juice with pectin ([facility]); ibuprofen  PRECAUTIONS: None     WEIGHT BEARING RESTRICTIONS: No  FALLS:  Has patient fallen in last 6 months? No  OCCUPATION: Production manager  PLOF: Independent  PATIENT GOALS: strengthening; easing of the pain   OBJECTIVE:   PATIENT SURVEYS :   09/28/23: FOTO: 67%  Eval:  FOTO 65%  COGNITION: Overall cognitive status: Within functional limits for tasks assessed      The Patient-Specific Functional Scale  Initial:  I am going to ask you to identify up to 3 important activities that you are unable to do or are having difficulty with as a result of this problem.  Today are there any activities that you are unable to do or having difficulty with because of this?  (Patient shown scale and patient rated each activity)  Follow up: When you first came in you had difficulty performing these activities.  Today do you still have difficulty?  Patient-Specific activity scoring scheme (Point to one number):  0 1 2 3 4 5 6 7 8 9  10 Unable                                                                                                          Able to perform To perform                                                                                                     activity at the same Activity         Level as before                                                                                                                       Injury or problem  Activity           Playing piano  Initial:          7  (eval), 10/14:   8  2.               Cooking, putting something in microwave/twisting wrist              Initial:    7  (eval), 10/14:  8                               APPEARANCE: minimal edema or bony enlargement  UPPER EXTREMITY ROM:  Decreased DIP and PIP flexion on right   Active ROM Right eval Left eval 10/8  Shoulder flexion  full   Shoulder extension     Shoulder abduction  full   Shoulder adduction     Shoulder internal rotation  full   Shoulder external rotation  full   Elbow flexion  full   Elbow extension 0 0   Wrist flexion 58 66 R 58  Wrist extension 45 65 R 55  Wrist ulnar deviation 20 28 R 25  Wrist radial deviation 18 22 R 34  Wrist pronation     Wrist supination 80 105 R 85  (Blank rows = not tested)  UPPER EXTREMITY MMT:  MMT Right eval Left eval Right  10/8  Shoulder flexion     Shoulder extension     Shoulder abduction     Shoulder adduction     Shoulder internal rotation     Shoulder external rotation     Middle trapezius     Lower trapezius     Elbow flexion 5 5   Elbow extension 5 5   Wrist flexion 4- 5   Wrist extension 4- 5   Wrist ulnar deviation 4- 5   Wrist radial deviation 4- 5   Wrist pronation 4- 5   Wrist supination 4-  5   Grip strength (lbs) 19 35 R 25  (Blank rows = not tested) Pincher strength thumb and index:  right 5#; left 6.5#;   10/8:  10# thumb to index finger right Pincher strength thumb and 4th finger:  rigth 2.5#; left 6# 10/8:  6.5# Decreased right hand intrinsic strength 4-/5; finger adductors 3/5   TODAY'S TREATMENT:    10/13/23: Discussed value of  strengthening for arthritis Blue and orange resistive balls: gross grip, pincher grip, thumb roll; finger adductor squeeze (likes this one),thumb to finger opposition 5-10x each 2# wrist flexion then pronation 10x 2# wrist extension then supination 10x Flexbar wrist flexion eccentrics (painful 5x) PNF red band D1 and D2 extension 10x each way  Biceps curl with supination 10x red band Pt given red band with handle for home UBE L2 x2 min fwd, 2 min bwd     09/28/23: FOTO, PSFS, goal review UBE L2 x2 fwd, L3x2 bwd PT present to discuss status and progress Rt wrist and finger warm up: circles bil x10 each way, finger and thumb abd/add 8x3" holds Velcro board pronation/supination with pincer grip 8x pain 2/10 with supination to pronation 2lb Rt wrist supination/pronation, radial deviation, ulnar deviation x12 each Small blue weighted ball self-toss catch standing with elbow bent in supination (pain 3-4/10) Standing Rt UE red tband tricep ext, shoulder extension x 15 each Simulated seat belt diagonal red tband x15, simulated Rt UE seat belt press to floor x15 red tband Seated flexion/pronation x10 red tband  10/8: UBE 2 forward/2 backward Simulation of buckling the seatbelt:  cable pulley 10x, then dumbbell on red power cord 10x2 Velcro board pronation/supination with pincher grip 8x pain 2-3/10 Wrist ROM Grip and pinch strength recheck and review of progress since start of care Red band wrist flexion then pronation 10x Red band wrist extension then supination 10x  10/1: UBE 2 forward/2 backward DIP, DIP flexion  with blocking 10x each Rt thumb in fist x 10  Rt thumb to pinky opposition x 10  Rt thumb to each finger x 5 1-2 sec hold Chain Pull Isometric x5  Radial/ulnar deviation 10x 2#  (2/10 pain) Flex grip with rubber circle 2x10 - PT encouraged CMC mobility with thumb toward pinky and 4thwith squeeze 2lb Rt wrist strength all directions x 10 (flexion/extension; pronation/  supination) 2/10 pain Biceps Curl 5# x 10 ( with supination) 2/10 pain Standing functional push press 5# 2 sets of 5 Triceps Extension green band 2 x 10 Wall slides with towel 10x Standing Lat bar 25# 10x; 30# 10x         PATIENT EDUCATION: Education details: Educated patient on anatomy and physiology of current symptoms, prognosis, plan of care as well as initial self care strategies to promote recovery;Discussed paraffin baths Light Theraputty  Person educated: Patient Education method: Explanation Education comprehension: verbalized understanding  HOME EXERCISE PROGRAM: Access Code: [email or web address] URL: [email or web address] Date: 09/08/2023 Prepared by: Lavinia Sharps  Exercises - Finger Abduction with Rubber Band  - 1 x daily - 7 x weekly - 2 sets - 10 reps - Seated Finger Flexion with Resistance  - 1 x daily - 7 x weekly - 2 sets - 10 reps - Seated Finger Adduction and Extension with Towel  - 1 x daily - 7 x weekly - 2 sets - 10 reps - Seated Wrist Flexion with Dumbbell  - 1 x daily - 7 x weekly - 1 sets - 10 reps - Seated Wrist Extension with Dumbbell  - 1 x daily - 7 x weekly - 1 sets - 10 reps - Seated Pronation Supination with Dumbbell  - 1 x daily - 7 x weekly - 1 sets - 10 reps - Seated Wrist Ulnar Deviation with Dumbbell  - 1 x daily - 7 x weekly - 1 sets - 10 reps - Seated Wrist Radial Deviation with Dumbbell  - 1 x daily - 7 x weekly - 1 sets - 10 reps - Seated Single Arm Bicep Curls Neutral with Dumbbell (Mirrored)  - 1 x daily - 7 x weekly - 1 sets - 10 reps - Seated Elbow Extension with Self-Anchored Resistance  - 1 x daily - 7 x weekly - 1 sets - 10 reps - Standing Single Arm Low Row with Anchored Resistance  - 1 x daily - 7 x weekly - 1 sets - 10 reps - Single Arm Shoulder Extension with Anchored Resistance  - 1 x daily - 7 x weekly - 1 sets - 10 reps - Thumb Radial Abduction with Rubber Band - Palm Down  - 1 x daily - 7 x weekly - 3 sets - 10 reps -  Thumb Flexion with Resistance  - 1 x daily - 7 x weekly - 3 sets - 10 reps - Seated Thumb Extension with Resistance  - 1 x daily - 7 x weekly - 3 sets - 10 reps - Tip Pinch with Putty  - 1 x daily - 7 x weekly - 1 sets - 10 reps - Finger Lumbricals with Putty  - 1 x daily -  7 x weekly - 1 sets - 10 reps - Finger Pinch and Pull with Putty  - 1 x daily - 7 x weekly - 1 sets - 10 reps - Seated Finger Extension with Putty  - 1 x daily - 7 x weekly - 1 sets - 10 reps - Thumb MCP and IP Flexion with Putty  - 1 x daily - 7 x weekly - 1 sets - 10 reps - Seated Finger Composite Flexion with Putty  - 1 x daily - 7 x weekly - 1 sets - 10 reps - Finger Pinch and Pull with Putty  - 1 x daily - 7 x weekly - 1 sets - 10 reps - Seated Claw Fist with Putty  - 1 x daily - 7 x weekly - 1 sets - 10 reps ASSESSMENT:  CLINICAL IMPRESSION: Less pain with wrist movements overall as well as functional movements like buckling the seatbelt. She did have more pain with attempted wrist flexor eccentric strengthening therefore discontinued.  Patient education on upper quarter strengthening particularly the muscles at the elbow and in the hand and how that can benefit OA.  Therapist monitoring response to all interventions and modifying treatment accordingly.    OBJECTIVE IMPAIRMENTS: decreased activity tolerance, decreased ROM, decreased strength, impaired perceived functional ability, impaired UE functional use, and pain.   ACTIVITY LIMITATIONS: carrying, lifting, and playing the piano professionally  PARTICIPATION LIMITATIONS: meal prep and occupation  PERSONAL FACTORS: previous history of tendontitis are also affecting patient's functional outcome.   REHAB POTENTIAL: Good  CLINICAL DECISION MAKING: Stable/uncomplicated  EVALUATION COMPLEXITY: Low  GOALS: Goals reviewed with patient? Yes  SHORT TERM GOALS: Target date: 09/01/2023   The patient will demonstrate knowledge of basic self care strategies and  exercises to promote healing  Baseline: Goal status:MET 9/12  2.  Right wrist strength improved to 4/5 needed for lifting and carrying objects in the kitchen Baseline:  Goal status: met 10/8  3.  Improved wrist ROM: flexion to 64 degrees, extension to 50 degrees needed for playing the piano Baseline:  Goal status: MET 10/8  4.  The patient will have grip and finger strength improved to lift a 2# mug in/out of the microwave Baseline:  Goal status: MET 10/8   LONG TERM GOALS: Target date: 11/30/2023   The patient will be independent in a safe self progression of a home exercise program to promote further recovery of function   Baseline:  Goal status: ongoing 10/14  2.  The patient will report an improvement in pain hand/wrist mobility needed for playing the piano with PSFS rating improved to 9 Baseline:  Goal status: ongoing, REACHED 8/10 FOR BOTH ACTIVITIES ON 10/14  3.  The patient will report an improvement with cooking including twisting and lifting a cup to the microwave with PSFS rating improved to 9 Baseline:  Goal status: ongoing 8/10 ON 10/14  4.  Right grip strength improved to 29# and pincher strength improved to 6# Baseline:  Goal status: partially met FOR GRIP, MET FOR PINCER 10/14  5.  The patient will have improved FOTO score to    72%   indicating improved function with less pain  Baseline:  Goal status: ongoing 67% ON 10/14   PLAN: PT FREQUENCY: every other week  PT DURATION: 8 weeks  PLANNED INTERVENTIONS: Therapeutic exercises, Therapeutic activity, Neuromuscular re-education, Patient/Family education, Self Care, Joint mobilization, Dry Needling, Electrical stimulation, Cryotherapy, Moist heat, Taping, Ultrasound, Ionotophoresis 4mg /ml Dexamethasone, Manual therapy, and Re-evaluation  PLAN  FOR NEXT SESSION: progress with PSFS activities, wrist, hand intrinsic and functional strength of Rt UE, focus on joint protection as needed   Lavinia Sharps,  PT 10/13/23 2:43 PM Phone: [phone] Fax: [phone]  Phone: [phone] Fax: [phone]

## 2023-10-27 ENCOUNTER — Ambulatory Visit: Payer: PPO | Attending: Family Medicine | Admitting: Physical Therapy

## 2023-10-27 DIAGNOSIS — M79644 Pain in right finger(s): Secondary | ICD-10-CM | POA: Diagnosis not present

## 2023-10-27 DIAGNOSIS — M25531 Pain in right wrist: Secondary | ICD-10-CM | POA: Insufficient documentation

## 2023-10-27 DIAGNOSIS — M6281 Muscle weakness (generalized): Secondary | ICD-10-CM | POA: Insufficient documentation

## 2023-10-27 NOTE — Therapy (Signed)
OUTPATIENT PHYSICAL THERAPY UPPER EXTREMITY PROGRESS NOTE   Patient Name: Kimberly Ryan MRN: [id number] DOB:[birth year], 72 y.o., female Today's Date: 10/27/2023    END OF SESSION:  PT End of Session - 10/27/23 1234     Visit Number 11    Date for PT Re-Evaluation 11/30/23    Authorization Type [employer]    Progress Note Due on Visit 20    PT Start Time 1234    PT Stop Time 1315    PT Time Calculation (min) 41 min    Activity Tolerance Patient tolerated treatment well                 Past Medical History:  Diagnosis Date   Anxiety    Arthritis    GERD (gastroesophageal reflux disease)    Headache(784.0)    Shortness of breath    with exertion    Thyroid disease    Past Surgical History:  Procedure Laterality Date   MYOMECTOMY  2000   RHINOPLASTY  1953   THYROID LOBECTOMY  08/17/2012   Procedure: THYROID LOBECTOMY;  Surgeon: Atilano Ina, MD,FACS;  Location: [facility];  Service: General;  Laterality: Left;  Left Thyroid Lobectomy   TONSILLECTOMY  1954   Patient Active Problem List   Diagnosis Date Noted   OP (osteoporosis) 01/06/2023   Postsurgical hypothyroidism 11/04/2012   Multiple thyroid nodules 12/19/2011    PCP: Inez Pilgrim NP  REFERRING PROVIDER: Inez Pilgrim NP  REFERRING DIAG: M25.531 Wrist pain, right   THERAPY DIAG:  Right wrist pain  Rationale for Evaluation and Treatment: Rehabilitation  ONSET DATE: 6 weeks ago  SUBJECTIVE:                                                                                                                                                                                      SUBJECTIVE STATEMENT: I have a new issue now with a Baker's cyst in my knee.  The left hand has gotten about the same as my right hand.  Some days it feels great and then the next day I have a fair amount of pain.   I haven't been good about doing my ex's.   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 Not painful at the moment NPRS scale: 0-4/10 Pain location: intermittent  right pain 4th finger and radial wrist Aggravating factors: AM stiffness;  playing the piano; wrist radial deviation, pronation; lifting a cup to the microwave Relieving factors: grape juice with pectin ([facility]); ibuprofen  PRECAUTIONS: None     WEIGHT BEARING RESTRICTIONS: No  FALLS:  Has patient fallen in last 6 months? No  OCCUPATION: Production manager  PLOF: Independent  PATIENT GOALS: strengthening; easing of the pain   OBJECTIVE:   PATIENT SURVEYS :   09/28/23: FOTO: 67%  Eval:  FOTO 65%  COGNITION: Overall cognitive status: Within functional limits for tasks assessed      The Patient-Specific Functional Scale  Initial:  I am going to ask you to identify up to 3 important activities that you are unable to do or are having difficulty with as a result of this problem.  Today are there any activities that you are unable to do or having difficulty with because of this?  (Patient shown scale and patient rated each activity)  Follow up: When you first came in you had difficulty performing these activities.  Today do you still have difficulty?  Patient-Specific activity scoring scheme (Point to one number):  0 1 2 3 4 5 6 7 8 9  10 Unable                                                                                                          Able to perform To perform                                                                                                    activity at the same Activity         Level as before                                                                                                                        Injury or problem  Activity           Playing piano  Initial:          7  (eval), 10/14:   8  2.               Cooking, putting something in microwave/twisting wrist              Initial:    7  (eval), 10/14:  8                               APPEARANCE: minimal edema or bony enlargement  UPPER EXTREMITY ROM:  Decreased DIP and PIP flexion on right   Active ROM Right eval Left eval 10/8  Shoulder flexion  full   Shoulder extension     Shoulder abduction  full   Shoulder adduction     Shoulder internal rotation  full   Shoulder external rotation  full   Elbow flexion  full   Elbow extension 0 0   Wrist flexion 58 66 R 58  Wrist extension 45 65 R 55  Wrist ulnar deviation 20 28 R 25  Wrist radial deviation 18 22 R 34  Wrist pronation     Wrist supination 80 105 R 85  (Blank rows = not tested)  UPPER EXTREMITY MMT:  MMT Right eval Left eval Right  10/8  Shoulder flexion     Shoulder extension     Shoulder abduction     Shoulder adduction     Shoulder internal rotation     Shoulder external rotation     Middle trapezius     Lower trapezius     Elbow flexion 5 5   Elbow extension 5 5   Wrist flexion 4- 5   Wrist extension 4- 5   Wrist ulnar deviation 4- 5   Wrist radial deviation 4- 5   Wrist pronation 4- 5   Wrist supination 4-  5   Grip strength (lbs) 19 35 R 25  (Blank rows = not tested) Pincher strength thumb and index:  right 5#; left 6.5#;   10/8:  10# thumb to index finger right Pincher strength thumb and 4th finger:  rigth 2.5#; left 6# 10/8:  6.5# Decreased right hand intrinsic strength 4-/5; finger adductors 3/5   TODAY'S TREATMENT:   10/27/23: UBE L2 x2 min fwd, 2 min bwd     pink resistive ball: thumb to middle finger pinch and circles 10x each Blue resistive ball  finger adductor squeeze 10x between each finger 2# overhead press with ulnar deviation 15x 2# wrist flexion then pronation 10x 2# wrist extension then supination 10x Standing Lat bar 30# 10x   Cable row 10# 2 sets of 10 (2nd set with horizontal grip) Triceps blue band 15x right 5# KB biceps curl 10x right/left Red web gross grip 10x Red web thumb to finger opposition 10x   10/13/23: Discussed value of strengthening for arthritis Blue and orange resistive balls: gross grip, pincher grip, thumb roll; finger adductor squeeze (likes this one),thumb to finger opposition 5-10x each 2# wrist flexion then pronation 10x 2# wrist extension then supination 10x Flexbar wrist flexion eccentrics (painful 5x) PNF red band D1 and D2 extension 10x each way  Biceps curl with supination 10x red band Pt given red band with handle for home UBE L2 x2 min fwd, 2 min bwd     09/28/23: FOTO, PSFS, goal review UBE L2 x2 fwd, L3x2 bwd PT present to discuss status and progress Rt wrist and  finger warm up: circles bil x10 each way, finger and thumb abd/add 8x3" holds Velcro board pronation/supination with pincer grip 8x pain 2/10 with supination to pronation 2lb Rt wrist supination/pronation, radial deviation, ulnar deviation x12 each Small blue weighted ball self-toss catch standing with elbow bent in supination (pain 3-4/10) Standing Rt UE red tband tricep ext, shoulder extension x 15 each Simulated seat belt diagonal red tband x15, simulated Rt UE seat belt press to floor x15 red tband Seated flexion/pronation x10 red tband  10/8: UBE 2 forward/2 backward Simulation of buckling the seatbelt: cable pulley 10x, then dumbbell on red power cord 10x2 Velcro board pronation/supination with pincher grip 8x pain 2-3/10 Wrist ROM Grip and pinch strength recheck and review of progress since start of care Red band wrist flexion then pronation 10x Red band wrist extension then supination 10x       PATIENT  EDUCATION: Education details: Educated patient on anatomy and physiology of current symptoms, prognosis, plan of care as well as initial self care strategies to promote recovery;Discussed paraffin baths Light Theraputty  Person educated: Patient Education method: Explanation Education comprehension: verbalized understanding  HOME EXERCISE PROGRAM: Access Code: [email or web address] URL: [email or web address] Date: 09/08/2023 Prepared by: Lavinia Sharps  Exercises - Finger Abduction with Rubber Band  - 1 x daily - 7 x weekly - 2 sets - 10 reps - Seated Finger Flexion with Resistance  - 1 x daily - 7 x weekly - 2 sets - 10 reps - Seated Finger Adduction and Extension with Towel  - 1 x daily - 7 x weekly - 2 sets - 10 reps - Seated Wrist Flexion with Dumbbell  - 1 x daily - 7 x weekly - 1 sets - 10 reps - Seated Wrist Extension with Dumbbell  - 1 x daily - 7 x weekly - 1 sets - 10 reps - Seated Pronation Supination with Dumbbell  - 1 x daily - 7 x weekly - 1 sets - 10 reps - Seated Wrist Ulnar Deviation with Dumbbell  - 1 x daily - 7 x weekly - 1 sets - 10 reps - Seated Wrist Radial Deviation with Dumbbell  - 1 x daily - 7 x weekly - 1 sets - 10 reps - Seated Single Arm Bicep Curls Neutral with Dumbbell (Mirrored)  - 1 x daily - 7 x weekly - 1 sets - 10 reps - Seated Elbow Extension with Self-Anchored Resistance  - 1 x daily - 7 x weekly - 1 sets - 10 reps - Standing Single Arm Low Row with Anchored Resistance  - 1 x daily - 7 x weekly - 1 sets - 10 reps - Single Arm Shoulder Extension with Anchored Resistance  - 1 x daily - 7 x weekly - 1 sets - 10 reps - Thumb Radial Abduction with Rubber Band - Palm Down  - 1 x daily - 7 x weekly - 3 sets - 10 reps - Thumb Flexion with Resistance  - 1 x daily - 7 x weekly - 3 sets - 10 reps - Seated Thumb Extension with Resistance  - 1 x daily - 7 x weekly - 3 sets - 10 reps - Tip Pinch with Putty  - 1 x daily - 7 x weekly - 1 sets - 10 reps - Finger  Lumbricals with Putty  - 1 x daily - 7 x weekly - 1 sets - 10 reps - Finger Pinch and Pull with Putty  - 1 x daily - 7 x weekly -  1 sets - 10 reps - Seated Finger Extension with Putty  - 1 x daily - 7 x weekly - 1 sets - 10 reps - Thumb MCP and IP Flexion with Putty  - 1 x daily - 7 x weekly - 1 sets - 10 reps - Seated Finger Composite Flexion with Putty  - 1 x daily - 7 x weekly - 1 sets - 10 reps - Finger Pinch and Pull with Putty  - 1 x daily - 7 x weekly - 1 sets - 10 reps - Seated Claw Fist with Putty  - 1 x daily - 7 x weekly - 1 sets - 10 reps ASSESSMENT:  CLINICAL IMPRESSION: Improvements in previous functional limitations including buckling seatbelt and lifting something out of the microwave.  Therapist providing verbal cues to optimize technique with  exercises in order to achieve the greatest benefit.  She should meet remaining goals in next 1-2 visits.     OBJECTIVE IMPAIRMENTS: decreased activity tolerance, decreased ROM, decreased strength, impaired perceived functional ability, impaired UE functional use, and pain.   ACTIVITY LIMITATIONS: carrying, lifting, and playing the piano professionally  PARTICIPATION LIMITATIONS: meal prep and occupation  PERSONAL FACTORS: previous history of tendontitis are also affecting patient's functional outcome.   REHAB POTENTIAL: Good  CLINICAL DECISION MAKING: Stable/uncomplicated  EVALUATION COMPLEXITY: Low  GOALS: Goals reviewed with patient? Yes  SHORT TERM GOALS: Target date: 09/01/2023   The patient will demonstrate knowledge of basic self care strategies and exercises to promote healing  Baseline: Goal status:MET 9/12  2.  Right wrist strength improved to 4/5 needed for lifting and carrying objects in the kitchen Baseline:  Goal status: met 10/8  3.  Improved wrist ROM: flexion to 64 degrees, extension to 50 degrees needed for playing the piano Baseline:  Goal status: MET 10/8  4.  The patient will have grip and finger  strength improved to lift a 2# mug in/out of the microwave Baseline:  Goal status: MET 10/8   LONG TERM GOALS: Target date: 11/30/2023   The patient will be independent in a safe self progression of a home exercise program to promote further recovery of function   Baseline:  Goal status: ongoing 10/14  2.  The patient will report an improvement in pain hand/wrist mobility needed for playing the piano with PSFS rating improved to 9 Baseline:  Goal status: ongoing, REACHED 8/10 FOR BOTH ACTIVITIES ON 10/14  3.  The patient will report an improvement with cooking including twisting and lifting a cup to the microwave with PSFS rating improved to 9 Baseline:  Goal status: ongoing 8/10 ON 10/14  4.  Right grip strength improved to 29# and pincher strength improved to 6# Baseline:  Goal status: partially met FOR GRIP, MET FOR PINCER 10/14  5.  The patient will have improved FOTO score to    72%   indicating improved function with less pain  Baseline:  Goal status: ongoing 67% ON 10/14   PLAN: PT FREQUENCY: every other week  PT DURATION: 8 weeks  PLANNED INTERVENTIONS: Therapeutic exercises, Therapeutic activity, Neuromuscular re-education, Patient/Family education, Self Care, Joint mobilization, Dry Needling, Electrical stimulation, Cryotherapy, Moist heat, Taping, Ultrasound, Ionotophoresis 4mg /ml Dexamethasone, Manual therapy, and Re-evaluation  PLAN FOR NEXT SESSION: progress with PSFS activities, wrist, hand intrinsic and functional strength of Rt UE, focus on joint protection as needed   Lavinia Sharps, PT 10/27/23 1:11 PM Phone: [phone] Fax: [phone]

## 2023-10-30 ENCOUNTER — Encounter: Payer: PPO | Admitting: Physical Therapy

## 2023-11-10 ENCOUNTER — Encounter: Payer: PPO | Admitting: Physical Therapy

## 2023-11-24 ENCOUNTER — Ambulatory Visit: Payer: PPO | Attending: Family Medicine | Admitting: Physical Therapy

## 2023-11-24 DIAGNOSIS — M79644 Pain in right finger(s): Secondary | ICD-10-CM | POA: Diagnosis not present

## 2023-11-24 DIAGNOSIS — M6281 Muscle weakness (generalized): Secondary | ICD-10-CM

## 2023-11-24 DIAGNOSIS — M25531 Pain in right wrist: Secondary | ICD-10-CM

## 2023-11-24 DIAGNOSIS — M79631 Pain in right forearm: Secondary | ICD-10-CM

## 2023-11-24 NOTE — Therapy (Signed)
OUTPATIENT PHYSICAL THERAPY UPPER EXTREMITY PROGRESS NOTE/DISCHARGE SUMMARY    Patient Name: Kimberly Ryan MRN: [id number] DOB:08-08-1951, 72 y.o., female Today's Date: 11/24/2023    END OF SESSION:  PT End of Session - 11/24/23 1407     Visit Number 12    Date for PT Re-Evaluation 11/30/23    Authorization Type [employer]    Progress Note Due on Visit 20    PT Start Time 1400    PT Stop Time 1440    PT Time Calculation (min) 40 min    Activity Tolerance Patient tolerated treatment well                 Past Medical History:  Diagnosis Date   Anxiety    Arthritis    GERD (gastroesophageal reflux disease)    Headache(784.0)    Shortness of breath    with exertion    Thyroid disease    Past Surgical History:  Procedure Laterality Date   MYOMECTOMY  2000   RHINOPLASTY  1953   THYROID LOBECTOMY  08/17/2012   Procedure: THYROID LOBECTOMY;  Surgeon: Atilano Ina, MD,FACS;  Location: [facility];  Service: General;  Laterality: Left;  Left Thyroid Lobectomy   TONSILLECTOMY  1954   Patient Active Problem List   Diagnosis Date Noted   OP (osteoporosis) 01/06/2023   Postsurgical hypothyroidism 11/04/2012   Multiple thyroid nodules 12/19/2011    PCP: Inez Pilgrim NP  REFERRING PROVIDER: Inez Pilgrim NP  REFERRING DIAG: M25.531 Wrist pain, right   THERAPY DIAG:  Right wrist pain  Rationale for Evaluation and Treatment: Rehabilitation  ONSET DATE: 6 weeks ago  SUBJECTIVE:                                                                                                                                                                                      SUBJECTIVE STATEMENT: It's been a busy month.  I was hoping we just exercise my arm.  I'd like to have a 10 min exercise routine.  I have the putty at home.  Hand dominance: Right  PERTINENT HISTORY: Pt is a Production manager Had PT in past for hand/finger tendonitis with  Loistine Simas History many years ago of right wrist fracture (no hardware) Osteopenia (bone density test 3 weeks ago improved on 1 hip, worse on the other) Glucosamine/chondroitin Tried previous HEP 1# wrist ex's but seemed to worsen  PAIN:  PAIN:  Are you having pain? No  NPRS scale: 0/10 Pain location: intermittent right pain 4th finger and radial wrist Aggravating factors: AM stiffness;  playing the piano; wrist radial deviation, pronation; lifting a cup to the microwave  Relieving factors: grape juice with pectin ([facility]); ibuprofen  PRECAUTIONS: None     WEIGHT BEARING RESTRICTIONS: No  FALLS:  Has patient fallen in last 6 months? No  OCCUPATION: Production manager  PLOF: Independent  PATIENT GOALS: strengthening; easing of the pain   OBJECTIVE:   PATIENT SURVEYS :  12/10:  98% 09/28/23: FOTO: 67%  Eval:  FOTO 65%  COGNITION: Overall cognitive status: Within functional limits for tasks assessed      The Patient-Specific Functional Scale  Initial:  I am going to ask you to identify up to 3 important activities that you are unable to do or are having difficulty with as a result of this problem.  Today are there any activities that you are unable to do or having difficulty with because of this?  (Patient shown scale and patient rated each activity)  Follow up: When you first came in you had difficulty performing these activities.  Today do you still have difficulty?  Patient-Specific activity scoring scheme (Point to one number):  0 1 2 3 4 5 6 7 8 9  10 Unable                                                                                                          Able to perform To perform                                                                                                    activity at the same Activity         Level as before                                                                                                                        Injury or problem  Activity           Playing piano  Initial:          7  (eval),   12/10:  10/14:   8  2.               Cooking, putting something in microwave/twisting wrist              Initial:    7  (eval), 10/14:  8            12/10:  9/10  HELPING HAND WITH 2 CUPS OF WATER                   APPEARANCE: minimal edema or bony enlargement  UPPER EXTREMITY ROM:  Decreased DIP and PIP flexion on right   Active ROM Right eval Left eval 10/8 12/10  Shoulder flexion  full  Pt defers remeasurements   Shoulder extension      Shoulder abduction  full    Shoulder adduction      Shoulder internal rotation  full    Shoulder external rotation  full    Elbow flexion  full    Elbow extension 0 0    Wrist flexion 58 66 R 58   Wrist extension 45 65 R 55   Wrist ulnar deviation 20 28 R 25   Wrist radial deviation 18 22 R 34   Wrist pronation      Wrist supination 80 105 R 85   (Blank rows = not tested)  UPPER EXTREMITY MMT:  MMT Right eval Left eval Right  10/8 12/10  Shoulder flexion      Shoulder extension      Shoulder abduction      Shoulder adduction      Shoulder internal rotation      Shoulder external rotation      Middle trapezius      Lower trapezius      Elbow flexion 5 5  Pt defers  Elbow extension 5 5    Wrist flexion 4- 5    Wrist extension 4- 5    Wrist ulnar deviation 4- 5    Wrist radial deviation 4- 5    Wrist pronation 4- 5    Wrist supination 4-  5    Grip strength (lbs) 19 35 R 25   (Blank rows = not tested) Pincher strength thumb and index:  right 5#; left 6.5#;   10/8:  10# thumb to index finger right Pincher strength thumb and 4th finger:  rigth 2.5#; left 6# 10/8:  6.5# Decreased right hand intrinsic strength 4-/5; finger adductors 3/5 12/10: pt defers reassessment    TODAY'S TREATMENT:   11/24/23: UBE L2 x2 min fwd, 2 min bwd   FOTO  Pt defers some  objective retests, preferring to focus on finalizing HEP Review of putty ex's for home  Finalization of HEP 2# wrist ex's: extension, flexion, radial deviation, ulnar deviation 2# supination, supination Elbow flexion 5# (challenging, pt thinks 4# would be better) 10x Triceps extension red band 10x Green band rows 10x Green band shoulder extension 10x Pair of 5# weight carry 3 laps farmer's carry Pt given written/pictures of HEP updated  10/27/23: UBE L2 x2 min fwd, 2 min bwd     pink resistive ball: thumb to middle finger pinch and circles 10x each Blue resistive ball finger adductor squeeze 10x between each finger 2# overhead press with ulnar deviation 15x 2# wrist flexion then pronation 10x 2# wrist extension then supination 10x Standing Lat bar 30# 10x  Cable row 10# 2 sets of 10 (2nd set with horizontal grip) Triceps blue band 15x right 5# KB biceps curl 10x right/left Red web gross grip 10x Red web thumb to finger opposition 10x   10/13/23: Discussed value of strengthening for arthritis Blue and orange resistive balls: gross grip, pincher grip, thumb roll; finger adductor squeeze (likes this one),thumb to finger opposition 5-10x each 2# wrist flexion then pronation 10x 2# wrist extension then supination 10x Flexbar wrist flexion eccentrics (painful 5x) PNF red band D1 and D2 extension 10x each way  Biceps curl with supination 10x red band Pt given red band with handle for home UBE L2 x2 min fwd, 2 min bwd     09/28/23: FOTO, PSFS, goal review UBE L2 x2 fwd, L3x2 bwd PT present to discuss status and progress Rt wrist and finger warm up: circles bil x10 each way, finger and thumb abd/add 8x3" holds Velcro board pronation/supination with pincer grip 8x pain 2/10 with supination to pronation 2lb Rt wrist supination/pronation, radial deviation, ulnar deviation x12 each Small blue weighted ball self-toss catch standing with elbow bent in supination (pain 3-4/10) Standing  Rt UE red tband tricep ext, shoulder extension x 15 each Simulated seat belt diagonal red tband x15, simulated Rt UE seat belt press to floor x15 red tband Seated flexion/pronation x10 red tband      PATIENT EDUCATION: Education details: Educated patient on anatomy and physiology of current symptoms, prognosis, plan of care as well as initial self care strategies to promote recovery;Discussed paraffin baths Light Theraputty  Person educated: Patient Education method: Explanation Education comprehension: verbalized understanding  HOME EXERCISE PROGRAM: Access Code: [email or web address] URL: [email or web address] Date: 11/24/2023 Prepared by: Lavinia Sharps  Exercises - Seated Finger Composite Flexion with Putty  - 1 x daily - 7 x weekly - 1 sets - 10 reps - Thumb MCP and IP Flexion with Putty  - 1 x daily - 7 x weekly - 1 sets - 10 reps - Seated Finger Flexion with Resistance  - 1 x daily - 7 x weekly - 2 sets - 10 reps - Seated Finger Extension with Putty  - 1 x daily - 7 x weekly - 1 sets - 10 reps - Finger Pinch and Pull with Putty  - 1 x daily - 7 x weekly - 1 sets - 10 reps - Seated Claw Fist with Putty  - 1 x daily - 7 x weekly - 1 sets - 10 reps - Finger Pinch and Pull with Putty  - 1 x daily - 7 x weekly - 1 sets - 10 reps - Tip Pinch with Putty  - 1 x daily - 7 x weekly - 1 sets - 10 reps - Finger Lumbricals with Putty  - 1 x daily - 7 x weekly - 1 sets - 10 reps - Thumb Radial Abduction with Rubber Band - Palm Down  - 1 x daily - 7 x weekly - 3 sets - 10 reps - Finger Abduction with Rubber Band  - 1 x daily - 7 x weekly - 2 sets - 10 reps - Seated Wrist Flexion with Dumbbell  - 1 x daily - 7 x weekly - 1 sets - 10 reps - Seated Wrist Extension with Dumbbell  - 1 x daily - 7 x weekly - 1 sets - 10 reps - Seated Pronation Supination with Dumbbell  - 1 x daily - 7 x weekly - 1 sets - 10 reps - Seated Wrist Ulnar Deviation  with Dumbbell  - 1 x daily - 7 x weekly - 1 sets - 10  reps - Seated Wrist Radial Deviation with Dumbbell  - 1 x daily - 7 x weekly - 1 sets - 10 reps - Seated Single Arm Bicep Curls Neutral with Dumbbell (Mirrored)  - 1 x daily - 7 x weekly - 1 sets - 10 reps - Seated Elbow Extension with Self-Anchored Resistance  - 1 x daily - 7 x weekly - 1 sets - 10 reps - Standing Single Arm Low Row with Anchored Resistance  - 1 x daily - 7 x weekly - 1 sets - 10 reps - Single Arm Shoulder Extension with Anchored Resistance  - 1 x daily - 7 x weekly - 1 sets - 10 reps - Farmer's Carry with Kettlebells  - 1 x daily - 7 x weekly - 1 sets - 60 hold ASSESSMENT:  CLINICAL IMPRESSION: The patient has met the majority of rehab goals, with noted improvements in pain reduction, outcome score, ROM, strength and functional mobility.  A comprehensive HEP has been established and anticipate further improvements over time with regular performance of the program.  Recommend discharge from PT at this time.   OBJECTIVE IMPAIRMENTS: decreased activity tolerance, decreased ROM, decreased strength, impaired perceived functional ability, impaired UE functional use, and pain.   ACTIVITY LIMITATIONS: carrying, lifting, and playing the piano professionally  PARTICIPATION LIMITATIONS: meal prep and occupation  PERSONAL FACTORS: previous history of tendontitis are also affecting patient's functional outcome.   REHAB POTENTIAL: Good  CLINICAL DECISION MAKING: Stable/uncomplicated  EVALUATION COMPLEXITY: Low  GOALS: Goals reviewed with patient? Yes  SHORT TERM GOALS: Target date: 09/01/2023   The patient will demonstrate knowledge of basic self care strategies and exercises to promote healing  Baseline: Goal status:MET 9/12  2.  Right wrist strength improved to 4/5 needed for lifting and carrying objects in the kitchen Baseline:  Goal status: met 10/8  3.  Improved wrist ROM: flexion to 64 degrees, extension to 50 degrees needed for playing the piano Baseline:  Goal  status: MET 10/8  4.  The patient will have grip and finger strength improved to lift a 2# mug in/out of the microwave Baseline:  Goal status: MET 10/8   LONG TERM GOALS: Target date: 11/30/2023   The patient will be independent in a safe self progression of a home exercise program to promote further recovery of function   Baseline:  Goal status: met 12/10 2.  The patient will report an improvement in pain hand/wrist mobility needed for playing the piano with PSFS rating improved to 9 Baseline:  Goal status: met 12/10 3.  The patient will report an improvement with cooking including twisting and lifting a cup to the microwave with PSFS rating improved to 9 Baseline:  Goal status: met 12/10 4.  Right grip strength improved to 29# and pincher strength improved to 6# Baseline:  Goal status: partially met FOR GRIP, MET FOR PINCER 10/14,not tested   5.  The patient will have improved FOTO score to    72%   indicating improved function with less pain  Baseline:  Goal status: met 12/10   PLAN: PHYSICAL THERAPY DISCHARGE SUMMARY  Visits from Start of Care: 12  Current functional level related to goals / functional outcomes: See clinical impressions above   Remaining deficits: As above   Education / Equipment: HEP   Patient agrees to discharge. Patient goals were met. Patient is being discharged due to meeting  the stated rehab goals.  Lavinia Sharps, PT 11/24/23 6:34 PM Phone: [phone] Fax: [phone]

## 2024-01-28 NOTE — Therapy (Signed)
OUTPATIENT PHYSICAL THERAPY THORACOLUMBAR EVALUATION   Patient Name: Kimberly Ryan MRN: [id number] DOB:02/04/1951, 73 y.o., female Today's Date: 01/29/2024  END OF SESSION:  PT End of Session - 01/29/24 1108     Visit Number 1    Date for PT Re-Evaluation 03/11/24    Authorization Type [employer]    Progress Note Due on Visit 10    PT Start Time 1108    PT Stop Time 1148    PT Time Calculation (min) 40 min    Activity Tolerance Patient tolerated treatment well    Behavior During Therapy WFL for tasks assessed/performed             Past Medical History:  Diagnosis Date   Anxiety    Arthritis    GERD (gastroesophageal reflux disease)    Headache(784.0)    Shortness of breath    with exertion    Thyroid disease    Past Surgical History:  Procedure Laterality Date   MYOMECTOMY  2000   RHINOPLASTY  1953   THYROID LOBECTOMY  08/17/2012   Procedure: THYROID LOBECTOMY;  Surgeon: Atilano Ina, MD,FACS;  Location: [facility];  Service: General;  Laterality: Left;  Left Thyroid Lobectomy   TONSILLECTOMY  1954   Patient Active Problem List   Diagnosis Date Noted   OP (osteoporosis) 01/06/2023   Postsurgical hypothyroidism 11/04/2012   Multiple thyroid nodules 12/19/2011    PCP: Inez Pilgrim, NP   REFERRING PROVIDER: Soundra Pilon, FNP   REFERRING DIAG: M54.50 (ICD-10-CM) - Low back pain, unspecified   Rationale for Evaluation and Treatment: Rehabilitation  THERAPY DIAG:  Other low back pain  Muscle weakness (generalized)  Cramp and spasm  ONSET DATE: 2 weeks  SUBJECTIVE:                                                                                                                                                                                           SUBJECTIVE STATEMENT: It's waking me up an hour early. Hurts when lying down. Once I start doing things it's not too bad. I have trouble having time to do exercises. A little pain with walking, but not bad  enough to stop.  PERTINENT HISTORY:  Low back pain,  SOB with exertion/stairs, R knee pain  PAIN:  Are you having pain? Yes: NPRS scale: 0 now up to 6-7/10 Pain location: low back maybe a little to the right Pain description: achy pain Aggravating factors: lying down Relieving factors: DKTC, moving around  PRECAUTIONS: None  RED FLAGS: None   WEIGHT BEARING RESTRICTIONS: No  FALLS:  Has patient fallen in last 6 months?  Yes. Number of falls 1 walking in the dark and bumped into something in the hall and fell on her knees  LIVING ENVIRONMENT: Lives with: lives with their spouse Lives in: House/apartment  OCCUPATION: Engineer, agricultural and church organist  PLOF: Independent, Vocation/Vocational requirements: sitting, and Leisure: walks daily   PATIENT GOALS: to get better, increase strength in low back  NEXT MD VISIT: none scheduled  OBJECTIVE:  Note: Objective measures were completed at Evaluation unless otherwise noted.  DIAGNOSTIC FINDINGS:  none  PATIENT SURVEYS:  Modified Oswestry  3 / 50 = 6.0 %   COGNITION: Overall cognitive status: Within functional limits for tasks assessed     SENSATION: WFL  MUSCLE LENGTH: Tightness in B lumbar paraspinals, L>R quads, L hip flexors,  L piriformis, gastrocs   POSTURE: rounded shoulders, forward head, and mild R lumbar convexity  PALPATION: Palpation: TTP at B gluteals. Increased tissue tension in B lumbar. Spinal Mobility: R L2/3 some pain with UPA mob   LUMBAR ROM: some pain in low back with prone ext  AROM eval  Flexion Hands to mid shin limited by HS  Extension   Right lateral flexion 9 some pain in R glute  Left lateral flexion 12  Right rotation Full   Left rotation full   (Blank rows = not tested)  LOWER EXTREMITY ROM:   knee flexion limited by tight quads L > R  Active  Right eval Left eval  Hip flexion    Hip extension    Hip abduction    Hip adduction    Hip internal rotation  WNL  Hip  external rotation  WNL  Knee flexion    Knee extension    Ankle dorsiflexion    Ankle plantarflexion    Ankle inversion    Ankle eversion     (Blank rows = not tested)  LOWER EXTREMITY MMT:    MMT Right eval Left eval  Hip flexion 4+ 5  Hip extension 4+ 4  Hip abduction    Hip adduction    Hip internal rotation    Hip external rotation    Knee flexion 4+ 5  Knee extension 5 5  Ankle dorsiflexion 5 5  Ankle plantarflexion    Ankle inversion    Ankle eversion     (Blank rows = not tested)  FUNCTIONAL TESTS:  5 times sit to stand: 16.8 sec  SLS R 19 sec L 14 sec     EC  feet together and apart x 30 sec ea   TREATMENT DATE:                                                                                                                               01/29/24 See pt ed and HEP    PATIENT EDUCATION:  Education details: PT eval findings, anticipated POC, initial HEP, and tips to reduce falls  Person educated: Patient Education method: Explanation, Demonstration, and Handouts Education comprehension:  verbalized understanding and returned demonstration  HOME EXERCISE PROGRAM: Access Code: [email or web address] URL: [email or web address] Date: 01/29/2024 Prepared by: Raynelle Fanning  Exercises - Seated Piriformis Stretch with Trunk Bend  - 2 x daily - 7 x weekly - 1 sets - 3 reps - 30-60 sec  hold - Child's Pose Stretch  - 1 x daily - 7 x weekly - 1 sets - 3 reps - 20-30 sec hold - Child's Pose with Sidebending  - 2 x daily - 7 x weekly - 1 sets - 2 reps - 60 sec hold - Standing Lumbar Spine Flexion Stretch Counter  - 1 x daily - 3 x weekly - 2 sets - 10 reps  ASSESSMENT:  CLINICAL IMPRESSION: Patient is a 73 y.o. female who was seen today for physical therapy evaluation and treatment for low back pain beginning about 2 weeks ago. Her pain wakes her up in the morning and she feels it some when she walks. She has tightness in her lumbar musculature and her left > right hip. She  has strength deficits in the hips and back also. She will benefit from skilled PT to address these deficits.  Patient reported one fall when waking in the dark at home. Screen of balance indicates she is not a fall risk, however PT advised increased lighting at home to prevent further falls.  OBJECTIVE IMPAIRMENTS: decreased activity tolerance, decreased ROM, decreased strength, hypomobility, increased muscle spasms, impaired flexibility, postural dysfunction, and pain.   ACTIVITY LIMITATIONS: lifting, sleeping, dressing, and locomotion level  PARTICIPATION LIMITATIONS: community activity  PERSONAL FACTORS: Age, Fitness, and 1 comorbidity: R knee pain  are also affecting patient's functional outcome.   REHAB POTENTIAL: Excellent  CLINICAL DECISION MAKING: Stable/uncomplicated  EVALUATION COMPLEXITY: Low   GOALS: Goals reviewed with patient? Yes  SHORT TERM GOALS: Target date: 02/19/2024   Patient will be independent with initial HEP.  Baseline:  Goal status: INITIAL  2.  Decreased pain by 25% in the morning.  Baseline:  Goal status: INITIAL   LONG TERM GOALS: Target date: 03/11/2024   Patient will be independent with advanced/ongoing HEP to improve outcomes and carryover.  Baseline:  Goal status: INITIAL  2.  Patient will be able to don her shoes/socks on the L side without having to lift her leg into fig 4 position Baseline:  Goal status: INITIAL  3.  Patient will be able to sleep without waking from pain  Baseline:  Goal status: INITIAL  4.  Patient will demonstrate improved LE strength as evidenced by decreased 5xSTS by 2-3 seconds. Baseline: 16.8 sec Goal status: INITIAL  5.  Patient will report decreased pain with walking by 50% or more. Baseline:  Goal status: INITIAL    PLAN:  PT FREQUENCY: 2x/week  PT DURATION: 6 weeks  PLANNED INTERVENTIONS: 97164- PT Re-evaluation, 97110-Therapeutic exercises, 97530- Therapeutic activity, 97112- Neuromuscular  re-education, 97535- Self Care, 16109- Manual therapy, 97014- Electrical stimulation (unattended), Patient/Family education, Taping, Dry Needling, Joint mobilization, Spinal mobilization, Cryotherapy, and Moist heat.  PLAN FOR NEXT SESSION: Review and progress HEP, L hip flexibility, B quads/hip flexors, core and Le strengthening   Solon Palm, PT  01/29/2024, 12:08 PM

## 2024-01-29 ENCOUNTER — Other Ambulatory Visit: Payer: Self-pay

## 2024-01-29 ENCOUNTER — Encounter: Payer: Self-pay | Admitting: Physical Therapy

## 2024-01-29 ENCOUNTER — Ambulatory Visit: Payer: PPO | Attending: Family Medicine | Admitting: Physical Therapy

## 2024-01-29 DIAGNOSIS — R252 Cramp and spasm: Secondary | ICD-10-CM | POA: Insufficient documentation

## 2024-01-29 DIAGNOSIS — M6281 Muscle weakness (generalized): Secondary | ICD-10-CM | POA: Diagnosis present

## 2024-01-29 DIAGNOSIS — M5459 Other low back pain: Secondary | ICD-10-CM | POA: Diagnosis present

## 2024-02-01 ENCOUNTER — Encounter: Payer: Self-pay | Admitting: Physical Therapy

## 2024-02-01 ENCOUNTER — Ambulatory Visit: Payer: PPO | Admitting: Physical Therapy

## 2024-02-01 DIAGNOSIS — M5459 Other low back pain: Secondary | ICD-10-CM | POA: Diagnosis not present

## 2024-02-01 DIAGNOSIS — M6281 Muscle weakness (generalized): Secondary | ICD-10-CM

## 2024-02-01 DIAGNOSIS — R252 Cramp and spasm: Secondary | ICD-10-CM

## 2024-02-01 NOTE — Therapy (Signed)
OUTPATIENT PHYSICAL THERAPY THORACOLUMBAR TREATMENT   Patient Name: Kimberly Ryan MRN: [id number] DOB:[birth year], 73 y.o., female Today's Date: 02/01/2024  END OF SESSION:  PT End of Session - 02/01/24 1539     Visit Number 2    Date for PT Re-Evaluation 03/11/24    Authorization Type HTA    Progress Note Due on Visit 10    PT Start Time 1539    PT Stop Time 1618    PT Time Calculation (min) 39 min    Activity Tolerance Patient tolerated treatment well    Behavior During Therapy WFL for tasks assessed/performed              Past Medical History:  Diagnosis Date   Anxiety    Arthritis    GERD (gastroesophageal reflux disease)    Headache(784.0)    Shortness of breath    with exertion    Thyroid disease    Past Surgical History:  Procedure Laterality Date   MYOMECTOMY  2000   RHINOPLASTY  1953   THYROID LOBECTOMY  08/17/2012   Procedure: THYROID LOBECTOMY;  Surgeon: Atilano Ina, MD,FACS;  Location: [facility];  Service: General;  Laterality: Left;  Left Thyroid Lobectomy   TONSILLECTOMY  1954   Patient Active Problem List   Diagnosis Date Noted   OP (osteoporosis) 01/06/2023   Postsurgical hypothyroidism 11/04/2012   Multiple thyroid nodules 12/19/2011    PCP: Inez Pilgrim, NP   REFERRING PROVIDER: Soundra Pilon, FNP   REFERRING DIAG: M54.50 (ICD-10-CM) - Low back pain, unspecified   Rationale for Evaluation and Treatment: Rehabilitation  THERAPY DIAG:  Other low back pain  Muscle weakness (generalized)  Cramp and spasm  ONSET DATE: 2 weeks  SUBJECTIVE:                                                                                                                                                                                           SUBJECTIVE STATEMENT: It' s been better since I last saw you, even in the morning.   PERTINENT HISTORY:  Low back pain,  SOB with exertion/stairs, R knee pain  PAIN:  Are you having pain? Yes: NPRS  scale: 0 now up to 6-7/10 Pain location: low back maybe a little to the right Pain description: achy pain Aggravating factors: lying down Relieving factors: DKTC, moving around  PRECAUTIONS: None  RED FLAGS: None   WEIGHT BEARING RESTRICTIONS: No  FALLS:  Has patient fallen in last 6 months? Yes. Number of falls 1 walking in the dark and bumped into something in the hall and fell on her knees  LIVING ENVIRONMENT:  Lives with: lives with their spouse Lives in: House/apartment  OCCUPATION: Engineer, agricultural and church organist  PLOF: Independent, Vocation/Vocational requirements: sitting, and Leisure: walks daily   PATIENT GOALS: to get better, increase strength in low back  NEXT MD VISIT: none scheduled  OBJECTIVE:  Note: Objective measures were completed at Evaluation unless otherwise noted.  DIAGNOSTIC FINDINGS:  none  PATIENT SURVEYS:  Modified Oswestry  3 / 50 = 6.0 %   COGNITION: Overall cognitive status: Within functional limits for tasks assessed     SENSATION: WFL  MUSCLE LENGTH: Tightness in B lumbar paraspinals, L>R quads, L hip flexors,  L piriformis, gastrocs   POSTURE: rounded shoulders, forward head, and mild R lumbar convexity  PALPATION: Palpation: TTP at B gluteals. Increased tissue tension in B lumbar. Spinal Mobility: R L2/3 some pain with UPA mob   LUMBAR ROM: some pain in low back with prone ext  AROM eval  Flexion Hands to mid shin limited by HS  Extension   Right lateral flexion 9 some pain in R glute  Left lateral flexion 12  Right rotation Full   Left rotation full   (Blank rows = not tested)  LOWER EXTREMITY ROM:   knee flexion limited by tight quads L > R  Active  Right eval Left eval  Hip flexion    Hip extension    Hip abduction    Hip adduction    Hip internal rotation  WNL  Hip external rotation  WNL  Knee flexion    Knee extension    Ankle dorsiflexion    Ankle plantarflexion    Ankle inversion    Ankle  eversion     (Blank rows = not tested)  LOWER EXTREMITY MMT:    MMT Right eval Left eval  Hip flexion 4+ 5  Hip extension 4+ 4  Hip abduction    Hip adduction    Hip internal rotation    Hip external rotation    Knee flexion 4+ 5  Knee extension 5 5  Ankle dorsiflexion 5 5  Ankle plantarflexion    Ankle inversion    Ankle eversion     (Blank rows = not tested)  FUNCTIONAL TESTS:  5 times sit to stand: 16.8 sec  SLS R 19 sec L 14 sec     EC  feet together and apart x 30 sec ea   TREATMENT DATE:                                                                                                                               02/01/24  Nustep L5 x  6 min Hip flexor stretch step x 30 sec B Supine quad stretch with strap x 30 sec B TA contraction hooklying - inhale/exhale/contract; then with march x 5 TA sequential march x 5 B 90/90 toe taps x 10 ea Crunches - cues for chin tuck and lifting scapulae off mat - also did obliques  with reaches to opp knee Prone pelvic press x 5 Prone hip ext x 10 B    01/29/24 See pt ed and HEP    PATIENT EDUCATION:  Education details: PT eval findings, anticipated POC, initial HEP, and tips to reduce falls  Person educated: Patient Education method: Explanation, Demonstration, and Handouts Education comprehension: verbalized understanding and returned demonstration  HOME EXERCISE PROGRAM: Access Code: [email or web address] URL: [email or web address] Date: 02/01/2024 Prepared by: Raynelle Fanning  Exercises - Seated Piriformis Stretch with Trunk Bend  - 2 x daily - 7 x weekly - 1 sets - 3 reps - 30-60 sec  hold - Child's Pose Stretch  - 1 x daily - 7 x weekly - 1 sets - 3 reps - 20-30 sec hold - Child's Pose with Sidebending  - 2 x daily - 7 x weekly - 1 sets - 2 reps - 60 sec hold - Standing Lumbar Spine Flexion Stretch Counter  - 1 x daily - 3 x weekly - 2 sets - 10 reps - Hooklying Sequential Leg March and Lower  - 1 x daily - 3 x weekly - 2  sets - 10 reps - Supine 90/90 Alternating Heel Touches with Posterior Pelvic Tilt  - 1 x daily - 3 x weekly - 2 sets - 10 reps - Prone Hip Extension  - 1 x daily - 3 x weekly - 2 sets - 10 reps - Curl Up with Arms Crossed  - 1 x daily - 3 x weekly - 2 sets - 10 reps - Curl Up with Reach  - 1 x daily - 3 x weekly - 2 sets - 10 reps  ASSESSMENT:  CLINICAL IMPRESSION: Alani presents with decreased pain today. We focused on core strength; both abdominals and lumbar with no reports of pain. We spent a lot of time working on isolating TA from respiratory muscles, but patient demos good TA activation.   OBJECTIVE IMPAIRMENTS: decreased activity tolerance, decreased ROM, decreased strength, hypomobility, increased muscle spasms, impaired flexibility, postural dysfunction, and pain.   ACTIVITY LIMITATIONS: lifting, sleeping, dressing, and locomotion level  PARTICIPATION LIMITATIONS: community activity  PERSONAL FACTORS: Age, Fitness, and 1 comorbidity: R knee pain  are also affecting patient's functional outcome.   REHAB POTENTIAL: Excellent  CLINICAL DECISION MAKING: Stable/uncomplicated  EVALUATION COMPLEXITY: Low   GOALS: Goals reviewed with patient? Yes  SHORT TERM GOALS: Target date: 02/19/2024   Patient will be independent with initial HEP.  Baseline:  Goal status: INITIAL  2.  Decreased pain by 25% in the morning.  Baseline:  Goal status: INITIAL   LONG TERM GOALS: Target date: 03/11/2024   Patient will be independent with advanced/ongoing HEP to improve outcomes and carryover.  Baseline:  Goal status: INITIAL  2.  Patient will be able to don her shoes/socks on the L side without having to lift her leg into fig 4 position Baseline:  Goal status: INITIAL  3.  Patient will be able to sleep without waking from pain  Baseline:  Goal status: INITIAL  4.  Patient will demonstrate improved LE strength as evidenced by decreased 5xSTS by 2-3 seconds. Baseline: 16.8 sec Goal  status: INITIAL  5.  Patient will report decreased pain with walking by 50% or more. Baseline:  Goal status: INITIAL    PLAN:  PT FREQUENCY: 2x/week  PT DURATION: 6 weeks  PLANNED INTERVENTIONS: 97164- PT Re-evaluation, 97110-Therapeutic exercises, 97530- Therapeutic activity, 97112- Neuromuscular re-education, 97535- Self Care, 95284- Manual therapy, 97014- Electrical stimulation (unattended), Patient/Family education, Taping,  Dry Needling, Joint mobilization, Spinal mobilization, Cryotherapy, and Moist heat.  PLAN FOR NEXT SESSION: continue  L hip flexibility, B quads/hip flexors, core and LE strengthening   Solon Palm, PT  02/01/2024, 5:27 PM

## 2024-02-03 ENCOUNTER — Ambulatory Visit: Payer: PPO | Admitting: Physical Therapy

## 2024-02-05 ENCOUNTER — Encounter: Payer: PPO | Admitting: Physical Therapy

## 2024-02-12 ENCOUNTER — Encounter: Payer: Self-pay | Admitting: Physical Therapy

## 2024-02-12 ENCOUNTER — Ambulatory Visit: Payer: PPO | Admitting: Physical Therapy

## 2024-02-12 DIAGNOSIS — M5459 Other low back pain: Secondary | ICD-10-CM | POA: Diagnosis not present

## 2024-02-12 DIAGNOSIS — R252 Cramp and spasm: Secondary | ICD-10-CM

## 2024-02-12 DIAGNOSIS — M6281 Muscle weakness (generalized): Secondary | ICD-10-CM

## 2024-02-12 NOTE — Therapy (Signed)
 OUTPATIENT PHYSICAL THERAPY THORACOLUMBAR TREATMENT   Patient Name: Kimberly Ryan MRN: [id number] DOB:12-22-1950, 73 y.o., female Today's Date: 02/12/2024  END OF SESSION:  PT End of Session - 02/12/24 0935     Visit Number 3    Date for PT Re-Evaluation 03/11/24    Authorization Type HTA    Progress Note Due on Visit 10    PT Start Time 0935    PT Stop Time 1018    PT Time Calculation (min) 43 min    Activity Tolerance Patient tolerated treatment well    Behavior During Therapy WFL for tasks assessed/performed               Past Medical History:  Diagnosis Date   Anxiety    Arthritis    GERD (gastroesophageal reflux disease)    Headache(784.0)    Shortness of breath    with exertion    Thyroid disease    Past Surgical History:  Procedure Laterality Date   MYOMECTOMY  2000   RHINOPLASTY  1953   THYROID LOBECTOMY  08/17/2012   Procedure: THYROID LOBECTOMY;  Surgeon: Atilano Ina, MD,FACS;  Location: [facility];  Service: General;  Laterality: Left;  Left Thyroid Lobectomy   TONSILLECTOMY  1954   Patient Active Problem List   Diagnosis Date Noted   OP (osteoporosis) 01/06/2023   Postsurgical hypothyroidism 11/04/2012   Multiple thyroid nodules 12/19/2011    PCP: Inez Pilgrim, NP   REFERRING PROVIDER: Soundra Pilon, FNP   REFERRING DIAG: M54.50 (ICD-10-CM) - Low back pain, unspecified   Rationale for Evaluation and Treatment: Rehabilitation  THERAPY DIAG:  Other low back pain  Muscle weakness (generalized)  Cramp and spasm  ONSET DATE: 2 weeks  SUBJECTIVE:                                                                                                                                                                                           SUBJECTIVE STATEMENT: Still having a little pain when I roll out of bed, but overall it's better.    PERTINENT HISTORY:  Low back pain,  SOB with exertion/stairs, R knee pain  PAIN:  Are you  having pain? Yes: NPRS scale: 0 now up to 6-7/10 Pain location: low back maybe a little to the right Pain description: achy pain Aggravating factors: lying down Relieving factors: DKTC, moving around  PRECAUTIONS: None  RED FLAGS: None   WEIGHT BEARING RESTRICTIONS: No  FALLS:  Has patient fallen in last 6 months? Yes. Number of falls 1 walking in the dark and bumped into something in the hall and fell on her  knees  LIVING ENVIRONMENT: Lives with: lives with their spouse Lives in: House/apartment  OCCUPATION: Engineer, agricultural and church organist  PLOF: Independent, Vocation/Vocational requirements: sitting, and Leisure: walks daily   PATIENT GOALS: to get better, increase strength in low back  NEXT MD VISIT: none scheduled  OBJECTIVE:  Note: Objective measures were completed at Evaluation unless otherwise noted.  DIAGNOSTIC FINDINGS:  none  PATIENT SURVEYS:  Modified Oswestry  3 / 50 = 6.0 %   COGNITION: Overall cognitive status: Within functional limits for tasks assessed     SENSATION: WFL  MUSCLE LENGTH: Tightness in B lumbar paraspinals, L>R quads, L hip flexors,  L piriformis, gastrocs   POSTURE: rounded shoulders, forward head, and mild R lumbar convexity  PALPATION: Palpation: TTP at B gluteals. Increased tissue tension in B lumbar. Spinal Mobility: R L2/3 some pain with UPA mob   LUMBAR ROM: some pain in low back with prone ext  AROM eval  Flexion Hands to mid shin limited by HS  Extension   Right lateral flexion 9 some pain in R glute  Left lateral flexion 12  Right rotation Full   Left rotation full   (Blank rows = not tested)  LOWER EXTREMITY ROM:   knee flexion limited by tight quads L > R  Active  Right eval Left eval  Hip flexion    Hip extension    Hip abduction    Hip adduction    Hip internal rotation  WNL  Hip external rotation  WNL  Knee flexion    Knee extension    Ankle dorsiflexion    Ankle plantarflexion    Ankle  inversion    Ankle eversion     (Blank rows = not tested)  LOWER EXTREMITY MMT:    MMT Right eval Left eval  Hip flexion 4+ 5  Hip extension 4+ 4  Hip abduction    Hip adduction    Hip internal rotation    Hip external rotation    Knee flexion 4+ 5  Knee extension 5 5  Ankle dorsiflexion 5 5  Ankle plantarflexion    Ankle inversion    Ankle eversion     (Blank rows = not tested)  FUNCTIONAL TESTS:  5 times sit to stand: 16.8 sec  SLS R 19 sec L 14 sec     EC  feet together and apart x 30 sec ea   TREATMENT DATE:                                                                                                                                [facility] Adult PT Treatment:                                                DATE: 02/12/24  Therapeutic Exercise: Lora Paula  L5 x  6 min Seated fig 4 x 30 sec ea Neuromuscular re-ed: TA contraction hooklying - inhale/exhale/contract; then with march 2x 5 TA sequential march x 5 B 90/90 toe taps x 10 ea 90/90 with alt leg press 2x 5 B Oblique crunches with reaches to opp knee x 10 B Prone pelvic press with hip ext 2 x 10 B Standing marching unilateral 10#KB x 10 B Therapeutic Activity: Sit to stand with red loop x 10  Side step red loop x 6    02/01/24  Nustep L5 x  6 min Hip flexor stretch step x 30 sec B Supine quad stretch with strap x 30 sec B TA contraction hooklying - inhale/exhale/contract; then with march x 5 TA sequential march x 5 B 90/90 toe taps x 10 ea Crunches - cues for chin tuck and lifting scapulae off mat - also did obliques with reaches to opp knee Prone pelvic press x 5 Prone hip ext x 10 B    01/29/24 See pt ed and HEP    PATIENT EDUCATION:  Education details: PT eval findings, anticipated POC, initial HEP, and tips to reduce falls  Person educated: Patient Education method: Explanation, Demonstration, and Handouts Education comprehension: verbalized understanding and returned demonstration  HOME EXERCISE  PROGRAM: Access Code: [email or web address] URL: [email or web address] Date: 02/12/2024 Prepared by: Raynelle Fanning  Exercises - Seated Piriformis Stretch with Trunk Bend  - 2 x daily - 7 x weekly - 1 sets - 3 reps - 30-60 sec  hold - Child's Pose Stretch  - 1 x daily - 7 x weekly - 1 sets - 3 reps - 20-30 sec hold - Child's Pose with Sidebending  - 2 x daily - 7 x weekly - 1 sets - 2 reps - 60 sec hold - Standing Lumbar Spine Flexion Stretch Counter  - 1 x daily - 3 x weekly - 2 sets - 10 reps - Hooklying Sequential Leg March and Lower  - 1 x daily - 3 x weekly - 2 sets - 10 reps - Supine 90/90 Alternating Heel Touches with Posterior Pelvic Tilt  - 1 x daily - 3 x weekly - 2 sets - 10 reps - Prone Hip Extension  - 1 x daily - 3 x weekly - 2 sets - 10 reps - Curl Up with Arms Crossed  - 1 x daily - 3 x weekly - 2 sets - 10 reps - Curl Up with Reach  - 1 x daily - 3 x weekly - 2 sets - 10 reps - Supine Transversus Abdominis Bracing with Leg Extension  - 1 x daily - 3 x weekly - 2 sets - 10 reps  ASSESSMENT:  CLINICAL IMPRESSION: Baleigh continues to report decreased pain in the morning which is her main goal. She is only somewhat compliant with her HEP stating she has done it twice since her last visit. We were able to progress her core strengthening today and she appears to have a good grasp of TA engagement. She demonstrated some hip ADDuction with sit to stand so we added the loop for resistance. Kerria continues to demonstrate potential for improvement and would benefit from continued skilled therapy to address impairments.    OBJECTIVE IMPAIRMENTS: decreased activity tolerance, decreased ROM, decreased strength, hypomobility, increased muscle spasms, impaired flexibility, postural dysfunction, and pain.   ACTIVITY LIMITATIONS: lifting, sleeping, dressing, and locomotion level  PARTICIPATION LIMITATIONS: community activity  PERSONAL FACTORS: Age, Fitness, and 1 comorbidity: R knee pain  are also  affecting patient's functional outcome.   REHAB POTENTIAL: Excellent  CLINICAL DECISION MAKING: Stable/uncomplicated  EVALUATION COMPLEXITY: Low   GOALS: Goals reviewed with patient? Yes  SHORT TERM GOALS: Target date: 02/19/2024   Patient will be independent with initial HEP.  Baseline:  Goal status: INITIAL  2.  Decreased pain by 25% in the morning.  Baseline:  Goal status: INITIAL   LONG TERM GOALS: Target date: 03/11/2024   Patient will be independent with advanced/ongoing HEP to improve outcomes and carryover.  Baseline:  Goal status: INITIAL  2.  Patient will be able to don her shoes/socks on the L side without having to lift her leg into fig 4 position Baseline:  Goal status: INITIAL  3.  Patient will be able to sleep without waking from pain  Baseline:  Goal status: INITIAL  4.  Patient will demonstrate improved LE strength as evidenced by decreased 5xSTS by 2-3 seconds. Baseline: 16.8 sec Goal status: INITIAL  5.  Patient will report decreased pain with walking by 50% or more. Baseline:  Goal status: INITIAL    PLAN:  PT FREQUENCY: 2x/week  PT DURATION: 6 weeks  PLANNED INTERVENTIONS: 97164- PT Re-evaluation, 97110-Therapeutic exercises, 97530- Therapeutic activity, 97112- Neuromuscular re-education, 97535- Self Care, 91478- Manual therapy, 97014- Electrical stimulation (unattended), Patient/Family education, Taping, Dry Needling, Joint mobilization, Spinal mobilization, Cryotherapy, and Moist heat.  PLAN FOR NEXT SESSION: continue core and LE strengthening progressing to more standing exercises as tolerated,  L hip flexibility, B quads/hip flexors   Solon Palm, PT  02/12/2024, 10:26 AM

## 2024-02-16 NOTE — Therapy (Signed)
 OUTPATIENT PHYSICAL THERAPY THORACOLUMBAR TREATMENT   Patient Name: Kimberly Ryan MRN: [id number] DOB:1951-04-30, 73 y.o., female Today's Date: 02/17/2024  END OF SESSION:  PT End of Session - 02/17/24 1021     Visit Number 4    Date for PT Re-Evaluation 03/11/24    Authorization Type HTA    PT Start Time 1020    PT Stop Time 1059    PT Time Calculation (min) 39 min    Activity Tolerance Patient tolerated treatment well    Behavior During Therapy WFL for tasks assessed/performed                Past Medical History:  Diagnosis Date   Anxiety    Arthritis    GERD (gastroesophageal reflux disease)    Headache(784.0)    Shortness of breath    with exertion    Thyroid disease    Past Surgical History:  Procedure Laterality Date   MYOMECTOMY  2000   RHINOPLASTY  1953   THYROID LOBECTOMY  08/17/2012   Procedure: THYROID LOBECTOMY;  Surgeon: Atilano Ina, MD,FACS;  Location: [facility];  Service: General;  Laterality: Left;  Left Thyroid Lobectomy   TONSILLECTOMY  1954   Patient Active Problem List   Diagnosis Date Noted   OP (osteoporosis) 01/06/2023   Postsurgical hypothyroidism 11/04/2012   Multiple thyroid nodules 12/19/2011    PCP: Inez Pilgrim, NP   REFERRING PROVIDER: Inez Pilgrim, NP   REFERRING DIAG: M54.50 (ICD-10-CM) - Low back pain, unspecified   Rationale for Evaluation and Treatment: Rehabilitation  THERAPY DIAG:  Other low back pain  Muscle weakness (generalized)  Cramp and spasm  ONSET DATE: 2 weeks  SUBJECTIVE:                                                                                                                                                                                           SUBJECTIVE STATEMENT: Had some sciatic-like pain in R leg a little over the past two days, but better today. Never had in the past.   PERTINENT HISTORY:  Low back pain,  SOB with exertion/stairs, R knee pain  PAIN:  Are you having  pain? Yes: NPRS scale: 0 now up to 6-7/10 Pain location: low back maybe a little to the right Pain description: achy pain Aggravating factors: lying down Relieving factors: DKTC, moving around  PRECAUTIONS: None  RED FLAGS: None   WEIGHT BEARING RESTRICTIONS: No  FALLS:  Has patient fallen in last 6 months? Yes. Number of falls 1 walking in the dark and bumped into something in the hall and fell on her knees  LIVING  ENVIRONMENT: Lives with: lives with their spouse Lives in: House/apartment  OCCUPATION: Engineer, agricultural and church organist  PLOF: Independent, Vocation/Vocational requirements: sitting, and Leisure: walks daily   PATIENT GOALS: to get better, increase strength in low back  NEXT MD VISIT: none scheduled  OBJECTIVE:  Note: Objective measures were completed at Evaluation unless otherwise noted.  DIAGNOSTIC FINDINGS:  none  PATIENT SURVEYS:  Modified Oswestry  3 / 50 = 6.0 %   COGNITION: Overall cognitive status: Within functional limits for tasks assessed     SENSATION: WFL  MUSCLE LENGTH: Tightness in B lumbar paraspinals, L>R quads, L hip flexors,  L piriformis, gastrocs   POSTURE: rounded shoulders, forward head, and mild R lumbar convexity  PALPATION: Palpation: TTP at B gluteals. Increased tissue tension in B lumbar. Spinal Mobility: R L2/3 some pain with UPA mob   LUMBAR ROM: some pain in low back with prone ext  AROM eval  Flexion Hands to mid shin limited by HS  Extension   Right lateral flexion 9 some pain in R glute  Left lateral flexion 12  Right rotation Full   Left rotation full   (Blank rows = not tested)  LOWER EXTREMITY ROM:   knee flexion limited by tight quads L > R  Active  Right eval Left eval  Hip flexion    Hip extension    Hip abduction    Hip adduction    Hip internal rotation  WNL  Hip external rotation  WNL  Knee flexion    Knee extension    Ankle dorsiflexion    Ankle plantarflexion    Ankle inversion     Ankle eversion     (Blank rows = not tested)  LOWER EXTREMITY MMT:    MMT Right eval Left eval  Hip flexion 4+ 5  Hip extension 4+ 4  Hip abduction    Hip adduction    Hip internal rotation    Hip external rotation    Knee flexion 4+ 5  Knee extension 5 5  Ankle dorsiflexion 5 5  Ankle plantarflexion    Ankle inversion    Ankle eversion     (Blank rows = not tested)  FUNCTIONAL TESTS:  5 times sit to stand: 16.8 sec  SLS R 19 sec L 14 sec     EC  feet together and apart x 30 sec ea   TREATMENT DATE:                                                                                                                                [facility] Adult PT Treatment:                                                DATE: 02/17/24  Therapeutic Exercise: Nustep L5 x  5 min  PT present to review status Seated fig 4 2 x 30 sec ea, L cross knee pull x 30 sec Neuromuscular re-ed: TA sequential march x 5 B 90/90 toe taps x 10 ea 90/90 with alt leg press 2x 5 B Oblique crunches with reaches to opp knee in 90/90 position x 10 B Prone pelvic press with hip ext 2 x 10 B Standing marching unilateral 10#KB x 10 B Standing TA with hip ABD and 45 deg ext 2x 10 ea B yellow loop at ankles Therapeutic Activity: Sit to stand with red loop x 10  Side step red loop x 6   [facility] Adult PT Treatment:                                                DATE: 02/12/24  Therapeutic Exercise: Nustep L5 x  6 min Seated fig 4 x 30 sec ea Neuromuscular re-ed: TA contraction hooklying - inhale/exhale/contract; then with march 2x 5 TA sequential march x 5 B 90/90 toe taps x 10 ea 90/90 with alt leg press 2x 5 B Oblique crunches with reaches to opp knee x 10 B Prone pelvic press with hip ext 2 x 10 B Standing marching unilateral 10#KB x 10 B Therapeutic Activity: Sit to stand with red loop x 10  Side step red loop x 6    02/01/24  Nustep L5 x  6 min Hip flexor stretch step x 30 sec B Supine quad stretch with strap  x 30 sec B TA contraction hooklying - inhale/exhale/contract; then with march x 5 TA sequential march x 5 B 90/90 toe taps x 10 ea Crunches - cues for chin tuck and lifting scapulae off mat - also did obliques with reaches to opp knee Prone pelvic press x 5 Prone hip ext x 10 B    01/29/24 See pt ed and HEP    PATIENT EDUCATION:  Education details: PT eval findings, anticipated POC, initial HEP, and tips to reduce falls  Person educated: Patient Education method: Explanation, Demonstration, and Handouts Education comprehension: verbalized understanding and returned demonstration  HOME EXERCISE PROGRAM: Access Code: [email or web address] URL: [email or web address] Date: 02/17/2024 Prepared by: Raynelle Fanning  Exercises - Seated Piriformis Stretch with Trunk Bend  - 2 x daily - 7 x weekly - 1 sets - 3 reps - 30-60 sec  hold - Child's Pose Stretch  - 1 x daily - 7 x weekly - 1 sets - 3 reps - 20-30 sec hold - Child's Pose with Sidebending  - 2 x daily - 7 x weekly - 1 sets - 2 reps - 60 sec hold - Standing Lumbar Spine Flexion Stretch Counter  - 1 x daily - 3 x weekly - 2 sets - 10 reps - Hooklying Sequential Leg March and Lower  - 1 x daily - 3 x weekly - 2 sets - 10 reps - Supine 90/90 Alternating Heel Touches with Posterior Pelvic Tilt  - 1 x daily - 3 x weekly - 2 sets - 10 reps - Prone Hip Extension  - 1 x daily - 3 x weekly - 2 sets - 10 reps - Curl Up with Arms Crossed  - 1 x daily - 3 x weekly - 2 sets - 10 reps - Curl Up with Reach  - 1 x daily - 3 x weekly - 2 sets -  10 reps - Supine Transversus Abdominis Bracing with Leg Extension  - 1 x daily - 3 x weekly - 2 sets - 10 reps - Standing Hip Abduction with Resistance at Ankles and Counter Support  - 1 x daily - 3 x weekly - 2 sets - 10 reps - Standing Hip Extension with Resistance at Ankles and Counter Support  - 1 x daily - 3 x weekly - 2 sets - 10 reps  ASSESSMENT:  CLINICAL IMPRESSION: Jowanda is progressing well with core  strengthening. She does have some gluteal weakness L> R with resisted band exercises and with prone hip extension. She tends to roll her R hip in prone when extending. She reports 75% improvement in LBP since starting PT. She continues to demonstrate potential for improvement and would benefit from continued skilled therapy to address impairments.    OBJECTIVE IMPAIRMENTS: decreased activity tolerance, decreased ROM, decreased strength, hypomobility, increased muscle spasms, impaired flexibility, postural dysfunction, and pain.   ACTIVITY LIMITATIONS: lifting, sleeping, dressing, and locomotion level  PARTICIPATION LIMITATIONS: community activity  PERSONAL FACTORS: Age, Fitness, and 1 comorbidity: R knee pain  are also affecting patient's functional outcome.   REHAB POTENTIAL: Excellent  CLINICAL DECISION MAKING: Stable/uncomplicated  EVALUATION COMPLEXITY: Low   GOALS: Goals reviewed with patient? Yes  SHORT TERM GOALS: Target date: 02/19/2024   Patient will be independent with initial HEP.  Baseline:  Goal status: IN PROGRESS (partially compliant)  2.  Decreased pain by 25% in the morning.  Baseline:  Goal status: MET   LONG TERM GOALS: Target date: 03/11/2024   Patient will be independent with advanced/ongoing HEP to improve outcomes and carryover.  Baseline:  Goal status: INITIAL  2.  Patient will be able to don her shoes/socks on the L side without having to lift her leg into fig 4 position Baseline:  Goal status: INITIAL  3.  Patient will be able to sleep without waking from pain  Baseline:  Goal status: MET  4.  Patient will demonstrate improved LE strength as evidenced by decreased 5xSTS by 2-3 seconds. Baseline: 16.8 sec Goal status: INITIAL  5.  Patient will report decreased pain with walking by 50% or more. Baseline:  Goal status: MET    PLAN:  PT FREQUENCY: 2x/week  PT DURATION: 6 weeks  PLANNED INTERVENTIONS: 97164- PT Re-evaluation,  97110-Therapeutic exercises, 97530- Therapeutic activity, 97112- Neuromuscular re-education, 97535- Self Care, 40981- Manual therapy, 97014- Electrical stimulation (unattended), Patient/Family education, Taping, Dry Needling, Joint mobilization, Spinal mobilization, Cryotherapy, and Moist heat.  PLAN FOR NEXT SESSION: continue core and LE strengthening progressing to more standing exercises as tolerated,  L hip flexibility, B quads/hip flexors   Solon Palm, PT  02/17/2024, 11:02 AM

## 2024-02-17 ENCOUNTER — Ambulatory Visit: Payer: PPO | Attending: Family Medicine | Admitting: Physical Therapy

## 2024-02-17 ENCOUNTER — Encounter: Payer: Self-pay | Admitting: Physical Therapy

## 2024-02-17 DIAGNOSIS — R252 Cramp and spasm: Secondary | ICD-10-CM | POA: Insufficient documentation

## 2024-02-17 DIAGNOSIS — M6281 Muscle weakness (generalized): Secondary | ICD-10-CM | POA: Insufficient documentation

## 2024-02-17 DIAGNOSIS — M5459 Other low back pain: Secondary | ICD-10-CM | POA: Diagnosis not present

## 2024-02-18 ENCOUNTER — Ambulatory Visit: Payer: PPO | Admitting: *Deleted

## 2024-02-18 VITALS — BP 125/80 | HR 72 | Temp 97.6°F | Resp 16 | Ht 66.0 in | Wt 204.0 lb

## 2024-02-18 DIAGNOSIS — M810 Age-related osteoporosis without current pathological fracture: Secondary | ICD-10-CM

## 2024-02-18 MED ORDER — DENOSUMAB 60 MG/ML ~~LOC~~ SOSY
60.0000 mg | PREFILLED_SYRINGE | Freq: Once | SUBCUTANEOUS | Status: AC
  Administered 2024-02-18: 60 mg via SUBCUTANEOUS
  Filled 2024-02-18: qty 1

## 2024-02-18 NOTE — Progress Notes (Signed)
 Diagnosis: Osteoporosis  Provider:  Chilton Greathouse MD  Procedure: Injection  Prolia (Denosumab), Dose: 60 mg, Site: subcutaneous, Number of injections: 1  Injection Site(s): Right arm  Post Care: Observation period completed  Discharge: Condition: Good, Destination: Home . AVS Provided  Performed by:  Forrest Moron, RN

## 2024-02-19 ENCOUNTER — Ambulatory Visit: Payer: PPO | Admitting: Physical Therapy

## 2024-02-19 ENCOUNTER — Encounter: Payer: Self-pay | Admitting: Physical Therapy

## 2024-02-19 DIAGNOSIS — M5459 Other low back pain: Secondary | ICD-10-CM | POA: Diagnosis not present

## 2024-02-19 DIAGNOSIS — R252 Cramp and spasm: Secondary | ICD-10-CM

## 2024-02-19 DIAGNOSIS — M6281 Muscle weakness (generalized): Secondary | ICD-10-CM

## 2024-02-19 NOTE — Therapy (Signed)
 OUTPATIENT PHYSICAL THERAPY THORACOLUMBAR TREATMENT   Patient Name: Kimberly Ryan MRN: [id number] DOB:28-May-1951, 73 y.o., female Today's Date: 02/19/2024  END OF SESSION:  PT End of Session - 02/19/24 0937     Visit Number 5    Date for PT Re-Evaluation 03/11/24    Authorization Type HTA    Progress Note Due on Visit 10    PT Start Time 0935    PT Stop Time 1013    PT Time Calculation (min) 38 min    Activity Tolerance Patient tolerated treatment well    Behavior During Therapy WFL for tasks assessed/performed                 Past Medical History:  Diagnosis Date   Anxiety    Arthritis    GERD (gastroesophageal reflux disease)    Headache(784.0)    Shortness of breath    with exertion    Thyroid disease    Past Surgical History:  Procedure Laterality Date   MYOMECTOMY  2000   RHINOPLASTY  1953   THYROID LOBECTOMY  08/17/2012   Procedure: THYROID LOBECTOMY;  Surgeon: Atilano Ina, MD,FACS;  Location: [facility];  Service: General;  Laterality: Left;  Left Thyroid Lobectomy   TONSILLECTOMY  1954   Patient Active Problem List   Diagnosis Date Noted   OP (osteoporosis) 01/06/2023   Postsurgical hypothyroidism 11/04/2012   Multiple thyroid nodules 12/19/2011    PCP: Inez Pilgrim, NP   REFERRING PROVIDER: Soundra Pilon, FNP   REFERRING DIAG: M54.50 (ICD-10-CM) - Low back pain, unspecified   Rationale for Evaluation and Treatment: Rehabilitation  THERAPY DIAG:  Other low back pain  Muscle weakness (generalized)  Cramp and spasm  ONSET DATE: 2 weeks  SUBJECTIVE:                                                                                                                                                                                           SUBJECTIVE STATEMENT: Working on my core is making a difference in my pain and mobility.    PERTINENT HISTORY:  Low back pain,  SOB with exertion/stairs, R knee pain  PAIN:  Are you having  pain? Yes: NPRS scale: 0 now up to 6-7/10 Pain location: low back maybe a little to the right Pain description: achy pain Aggravating factors: lying down Relieving factors: DKTC, moving around  PRECAUTIONS: None  RED FLAGS: None   WEIGHT BEARING RESTRICTIONS: No  FALLS:  Has patient fallen in last 6 months? Yes. Number of falls 1 walking in the dark and bumped into something in the hall and fell on her  knees  LIVING ENVIRONMENT: Lives with: lives with their spouse Lives in: House/apartment  OCCUPATION: Engineer, agricultural and church organist  PLOF: Independent, Vocation/Vocational requirements: sitting, and Leisure: walks daily   PATIENT GOALS: to get better, increase strength in low back  NEXT MD VISIT: none scheduled  OBJECTIVE:  Note: Objective measures were completed at Evaluation unless otherwise noted.  DIAGNOSTIC FINDINGS:  none  PATIENT SURVEYS:  Modified Oswestry  3 / 50 = 6.0 %   COGNITION: Overall cognitive status: Within functional limits for tasks assessed     SENSATION: WFL  MUSCLE LENGTH: Tightness in B lumbar paraspinals, L>R quads, L hip flexors,  L piriformis, gastrocs   POSTURE: rounded shoulders, forward head, and mild R lumbar convexity  PALPATION: Palpation: TTP at B gluteals. Increased tissue tension in B lumbar. Spinal Mobility: R L2/3 some pain with UPA mob   LUMBAR ROM: some pain in low back with prone ext  AROM eval  Flexion Hands to mid shin limited by HS  Extension   Right lateral flexion 9 some pain in R glute  Left lateral flexion 12  Right rotation Full   Left rotation full   (Blank rows = not tested)  LOWER EXTREMITY ROM:   knee flexion limited by tight quads L > R  Active  Right eval Left eval  Hip flexion    Hip extension    Hip abduction    Hip adduction    Hip internal rotation  WNL  Hip external rotation  WNL  Knee flexion    Knee extension    Ankle dorsiflexion    Ankle plantarflexion    Ankle inversion     Ankle eversion     (Blank rows = not tested)  LOWER EXTREMITY MMT:    MMT Right eval Left eval  Hip flexion 4+ 5  Hip extension 4+ 4  Hip abduction    Hip adduction    Hip internal rotation    Hip external rotation    Knee flexion 4+ 5  Knee extension 5 5  Ankle dorsiflexion 5 5  Ankle plantarflexion    Ankle inversion    Ankle eversion     (Blank rows = not tested)  FUNCTIONAL TESTS:  5 times sit to stand: 16.8 sec  SLS R 19 sec L 14 sec     EC  feet together and apart x 30 sec ea   TREATMENT DATE:                                                                                                                               [facility] Adult PT Treatment:                                                DATE: 02/19/24  NuStep L5 x 5'  PT present to discuss status Seated fig 4 edge of mat table (modified pigeon) x20" bil Seated fig 4 cross leg pull and push x30"  Neuro re-ed: TA sequential march x 5 B 90/90 toe taps x 10 ea 90/90 with alt leg press 2x 5 B Oblique crunches with reaches to opp knee in 90/90 position x 10 B Prone pelvic press with hip ext 2 x 10 B Standing marching unilateral 10#KB x 10 B Standing TA with hip ABD and 45 deg ext 2x 10 ea B yellow loop at ankles Therapeutic Activity: Sit to stand with red loop x 10  Side step red loop x 6    [facility] Adult PT Treatment:                                                DATE: 02/17/24  Therapeutic Exercise: Nustep L5 x  5 min  PT present to review status Seated fig 4 2 x 30 sec ea, L cross knee pull x 30 sec Neuromuscular re-ed: TA sequential march x 5 B 90/90 toe taps x 10 ea 90/90 with alt leg press 2x 5 B Oblique crunches with reaches to opp knee in 90/90 position x 10 B Prone pelvic press with hip ext 2 x 10 B Standing marching unilateral 10#KB x 10 B Standing TA with hip ABD and 45 deg ext 2x 10 ea B yellow loop at ankles Therapeutic Activity: Sit to stand with red loop x 10  Side step red loop x 6   [facility] Adult  PT Treatment:                                                DATE: 02/12/24  Therapeutic Exercise: Nustep L5 x  6 min Seated fig 4 x 30 sec ea Neuromuscular re-ed: TA contraction hooklying - inhale/exhale/contract; then with march 2x 5 TA sequential march x 5 B 90/90 toe taps x 10 ea 90/90 with alt leg press 2x 5 B Oblique crunches with reaches to opp knee x 10 B Prone pelvic press with hip ext 2 x 10 B Standing marching unilateral 10#KB x 10 B Therapeutic Activity: Sit to stand with red loop x 10  Side step red loop x 6    02/01/24  Nustep L5 x  6 min Hip flexor stretch step x 30 sec B Supine quad stretch with strap x 30 sec B TA contraction hooklying - inhale/exhale/contract; then with march x 5 TA sequential march x 5 B 90/90 toe taps x 10 ea Crunches - cues for chin tuck and lifting scapulae off mat - also did obliques with reaches to opp knee Prone pelvic press x 5 Prone hip ext x 10 B    01/29/24 See pt ed and HEP    PATIENT EDUCATION:  Education details: PT eval findings, anticipated POC, initial HEP, and tips to reduce falls  Person educated: Patient Education method: Explanation, Demonstration, and Handouts Education comprehension: verbalized understanding and returned demonstration  HOME EXERCISE PROGRAM: Access Code: [email or web address] URL: [email or web address] Date: 02/17/2024 Prepared by: Raynelle Fanning  Exercises - Seated Piriformis Stretch with Trunk Bend  - 2 x daily - 7 x weekly - 1 sets - 3 reps - 30-60 sec  hold - Child's Pose Stretch  - 1 x daily - 7 x weekly - 1 sets - 3 reps - 20-30 sec hold - Child's Pose with Sidebending  - 2 x daily - 7 x weekly - 1 sets - 2 reps - 60 sec hold - Standing Lumbar Spine Flexion Stretch Counter  - 1 x daily - 3 x weekly - 2 sets - 10 reps - Hooklying Sequential Leg March and Lower  - 1 x daily - 3 x weekly - 2 sets - 10 reps - Supine 90/90 Alternating Heel Touches with Posterior Pelvic Tilt  - 1 x daily - 3 x weekly -  2 sets - 10 reps - Prone Hip Extension  - 1 x daily - 3 x weekly - 2 sets - 10 reps - Curl Up with Arms Crossed  - 1 x daily - 3 x weekly - 2 sets - 10 reps - Curl Up with Reach  - 1 x daily - 3 x weekly - 2 sets - 10 reps - Supine Transversus Abdominis Bracing with Leg Extension  - 1 x daily - 3 x weekly - 2 sets - 10 reps - Standing Hip Abduction with Resistance at Ankles and Counter Support  - 1 x daily - 3 x weekly - 2 sets - 10 reps - Standing Hip Extension with Resistance at Ankles and Counter Support  - 1 x daily - 3 x weekly - 2 sets - 10 reps  ASSESSMENT:  CLINICAL IMPRESSION: Kimberly Ryan is progressing well with core strengthening. She demos improving midline stability with distal challenges and layering on prime mover strength.  She does have some gluteal weakness L> R with resisted band exercises and with prone hip extension.  She reports 75% improvement in LBP since starting PT. She continues to demonstrate potential for improvement and would benefit from continued skilled therapy to address impairments.    OBJECTIVE IMPAIRMENTS: decreased activity tolerance, decreased ROM, decreased strength, hypomobility, increased muscle spasms, impaired flexibility, postural dysfunction, and pain.   ACTIVITY LIMITATIONS: lifting, sleeping, dressing, and locomotion level  PARTICIPATION LIMITATIONS: community activity  PERSONAL FACTORS: Age, Fitness, and 1 comorbidity: R knee pain  are also affecting patient's functional outcome.   REHAB POTENTIAL: Excellent  CLINICAL DECISION MAKING: Stable/uncomplicated  EVALUATION COMPLEXITY: Low   GOALS: Goals reviewed with patient? Yes  SHORT TERM GOALS: Target date: 02/19/2024   Patient will be independent with initial HEP.  Baseline:  Goal status: IN PROGRESS (partially compliant)  2.  Decreased pain by 25% in the morning.  Baseline:  Goal status: MET   LONG TERM GOALS: Target date: 03/11/2024   Patient will be independent with  advanced/ongoing HEP to improve outcomes and carryover.  Baseline:  Goal status: INITIAL  2.  Patient will be able to achieve fig 4 position without manual assistance due to improved Lt hip ROM and strength.  Baseline:  Goal status: INITIAL  3.  Patient will be able to sleep without waking from pain  Baseline:  Goal status: MET  4.  Patient will demonstrate improved LE strength as evidenced by decreased 5xSTS by 2-3 seconds. Baseline: 16.8 sec Goal status: INITIAL  5.  Patient will report decreased pain with walking by 50% or more. Baseline:  Goal status: MET    PLAN:  PT FREQUENCY: 2x/week  PT DURATION: 6 weeks  PLANNED INTERVENTIONS: 97164- PT Re-evaluation, 97110-Therapeutic exercises, 97530- Therapeutic activity, 97112- Neuromuscular re-education, 97535- Self Care, [id number]- Manual therapy, 97014- Electrical stimulation (unattended), Patient/Family education,  Taping, Dry Needling, Joint mobilization, Spinal mobilization, Cryotherapy, and Moist heat.  PLAN FOR NEXT SESSION: continue core and LE strengthening progressing to more standing exercises as tolerated,  L hip flexibility, B quads/hip flexors   Solon Palm, PT  02/19/2024, 10:14 AM

## 2024-02-22 ENCOUNTER — Encounter: Payer: Self-pay | Admitting: Physical Therapy

## 2024-02-22 ENCOUNTER — Ambulatory Visit: Payer: PPO | Admitting: Physical Therapy

## 2024-02-22 DIAGNOSIS — M1711 Unilateral primary osteoarthritis, right knee: Secondary | ICD-10-CM | POA: Diagnosis not present

## 2024-02-22 DIAGNOSIS — M6281 Muscle weakness (generalized): Secondary | ICD-10-CM

## 2024-02-22 DIAGNOSIS — M5459 Other low back pain: Secondary | ICD-10-CM

## 2024-02-22 DIAGNOSIS — R252 Cramp and spasm: Secondary | ICD-10-CM

## 2024-02-22 NOTE — Therapy (Signed)
 OUTPATIENT PHYSICAL THERAPY THORACOLUMBAR TREATMENT   Patient Name: Kimberly Ryan MRN: [id number] DOB:[birth year], 73 y.o., female Today's Date: 02/22/2024  END OF SESSION:  PT End of Session - 02/22/24 1232     Visit Number 6    Date for PT Re-Evaluation 03/11/24    Authorization Type HTA    Progress Note Due on Visit 10    PT Start Time 1232    PT Stop Time 1314    PT Time Calculation (min) 42 min    Activity Tolerance Patient tolerated treatment well    Behavior During Therapy WFL for tasks assessed/performed                 Past Medical History:  Diagnosis Date   Anxiety    Arthritis    GERD (gastroesophageal reflux disease)    Headache(784.0)    Shortness of breath    with exertion    Thyroid disease    Past Surgical History:  Procedure Laterality Date   MYOMECTOMY  2000   RHINOPLASTY  1953   THYROID LOBECTOMY  08/17/2012   Procedure: THYROID LOBECTOMY;  Surgeon: Atilano Ina, MD,FACS;  Location: [facility];  Service: General;  Laterality: Left;  Left Thyroid Lobectomy   TONSILLECTOMY  1954   Patient Active Problem List   Diagnosis Date Noted   OP (osteoporosis) 01/06/2023   Postsurgical hypothyroidism 11/04/2012   Multiple thyroid nodules 12/19/2011    PCP: Inez Pilgrim, NP   REFERRING PROVIDER: Soundra Pilon, FNP   REFERRING DIAG: M54.50 (ICD-10-CM) - Low back pain, unspecified   Rationale for Evaluation and Treatment: Rehabilitation  THERAPY DIAG:  Other low back pain  Muscle weakness (generalized)  Cramp and spasm  ONSET DATE: 2 weeks  SUBJECTIVE:                                                                                                                                                                                           SUBJECTIVE STATEMENT: I'm not having any pain. I had my knee injected this morning.    PERTINENT HISTORY:  Low back pain,  SOB with exertion/stairs, R knee pain  PAIN:  Are you having pain?  Yes: NPRS scale: 0 now up to 6-7/10 Pain location: low back maybe a little to the right Pain description: achy pain Aggravating factors: lying down Relieving factors: DKTC, moving around  PRECAUTIONS: None  RED FLAGS: None   WEIGHT BEARING RESTRICTIONS: No  FALLS:  Has patient fallen in last 6 months? Yes. Number of falls 1 walking in the dark and bumped into something in the hall and fell on her knees  LIVING ENVIRONMENT: Lives with: lives with their spouse Lives in: House/apartment  OCCUPATION: Engineer, agricultural and church organist  PLOF: Independent, Vocation/Vocational requirements: sitting, and Leisure: walks daily   PATIENT GOALS: to get better, increase strength in low back  NEXT MD VISIT: none scheduled  OBJECTIVE:  Note: Objective measures were completed at Evaluation unless otherwise noted.  DIAGNOSTIC FINDINGS:  none  PATIENT SURVEYS:  Modified Oswestry  3 / 50 = 6.0 %   COGNITION: Overall cognitive status: Within functional limits for tasks assessed     SENSATION: WFL  MUSCLE LENGTH: Tightness in B lumbar paraspinals, L>R quads, L hip flexors,  L piriformis, gastrocs   POSTURE: rounded shoulders, forward head, and mild R lumbar convexity  PALPATION: Palpation: TTP at B gluteals. Increased tissue tension in B lumbar. Spinal Mobility: R L2/3 some pain with UPA mob   LUMBAR ROM: some pain in low back with prone ext  AROM eval  Flexion Hands to mid shin limited by HS  Extension   Right lateral flexion 9 some pain in R glute  Left lateral flexion 12  Right rotation Full   Left rotation full   (Blank rows = not tested)  LOWER EXTREMITY ROM:   knee flexion limited by tight quads L > R  Active  Right eval Left eval  Hip flexion    Hip extension    Hip abduction    Hip adduction    Hip internal rotation  WNL  Hip external rotation  WNL  Knee flexion    Knee extension    Ankle dorsiflexion    Ankle plantarflexion    Ankle inversion     Ankle eversion     (Blank rows = not tested)  LOWER EXTREMITY MMT:    MMT Right eval Left eval  Hip flexion 4+ 5  Hip extension 4+ 4  Hip abduction    Hip adduction    Hip internal rotation    Hip external rotation    Knee flexion 4+ 5  Knee extension 5 5  Ankle dorsiflexion 5 5  Ankle plantarflexion    Ankle inversion    Ankle eversion     (Blank rows = not tested)  FUNCTIONAL TESTS:  5 times sit to stand: 16.8 sec  SLS R 19 sec L 14 sec     EC  feet together and apart x 30 sec ea   TREATMENT DATE:                                                                                                                                [facility] Adult PT Treatment:                                                DATE: 02/22/24    Neuro re-ed: Pelvic  floor contraction with 3 sec exhale x 10 Long discussion and trials of breathing to separate pelvic floor from TA TA sequential march x 5 B 90/90 toe taps x 10 ea 90/90 with alt leg press 1x10 B Oblique crunches with reaches to opp knee in 90/90 position x 10 B S/L clams red loop with TA contraction and focused on breathing 2x10 B Reverse clam red loop (kept above knees due to injection) 2x10 B Prone pelvic press with hip ext 2 x 10 B Standing TA with hip ABD and 45 deg ext 2x 10 ea B red loop at thighs (due to knee injection)   [facility] Adult PT Treatment:                                                DATE: 02/19/24  NuStep L5 x 5' PT present to discuss status Seated fig 4 edge of mat table (modified pigeon) x20" bil Seated fig 4 cross leg pull and push x30"  Neuro re-ed: TA sequential march x 5 B 90/90 toe taps x 10 ea 90/90 with alt leg press 2x 5 B Oblique crunches with reaches to opp knee in 90/90 position x 10 B Prone pelvic press with hip ext red loop on thighs 2 x 10 B Standing marching unilateral 10#KB x 10 B Standing TA with hip ABD and 45 deg ext 2x 10 ea B yellow loop at ankles Therapeutic Activity: Sit to stand with red  loop x 10  Side step red loop x 6    [facility] Adult PT Treatment:                                                DATE: 02/17/24  Therapeutic Exercise: Nustep L5 x  5 min  PT present to review status Seated fig 4 2 x 30 sec ea, L cross knee pull x 30 sec Neuromuscular re-ed: TA sequential march x 5 B 90/90 toe taps x 10 ea 90/90 with alt leg press 2x 5 B Oblique crunches with reaches to opp knee in 90/90 position x 10 B Prone pelvic press with hip ext 2 x 10 B Standing marching unilateral 10#KB x 10 B Standing TA with hip ABD and 45 deg ext 2x 10 ea B yellow loop at ankles Therapeutic Activity: Sit to stand with red loop x 10  Side step red loop x 6   [facility] Adult PT Treatment:                                                DATE: 02/12/24  Therapeutic Exercise: Nustep L5 x  6 min Seated fig 4 x 30 sec ea Neuromuscular re-ed: TA contraction hooklying - inhale/exhale/contract; then with march 2x 5 TA sequential march x 5 B 90/90 toe taps x 10 ea 90/90 with alt leg press 2x 5 B Oblique crunches with reaches to opp knee x 10 B Prone pelvic press with hip ext 2 x 10 B Standing marching unilateral 10#KB x 10 B Therapeutic Activity: Sit to stand with red loop x 10  Side step  red loop x 6    02/01/24  Nustep L5 x  6 min Hip flexor stretch step x 30 sec B Supine quad stretch with strap x 30 sec B TA contraction hooklying - inhale/exhale/contract; then with march x 5 TA sequential march x 5 B 90/90 toe taps x 10 ea Crunches - cues for chin tuck and lifting scapulae off mat - also did obliques with reaches to opp knee Prone pelvic press x 5 Prone hip ext x 10 B    01/29/24 See pt ed and HEP    PATIENT EDUCATION:  Education details: PT eval findings, anticipated POC, initial HEP, and tips to reduce falls  Person educated: Patient Education method: Explanation, Demonstration, and Handouts Education comprehension: verbalized understanding and returned demonstration  HOME EXERCISE  PROGRAM: Access Code: [email or web address] URL: [email or web address] Date: 02/17/2024 Prepared by: Raynelle Fanning  Exercises - Seated Piriformis Stretch with Trunk Bend  - 2 x daily - 7 x weekly - 1 sets - 3 reps - 30-60 sec  hold - Child's Pose Stretch  - 1 x daily - 7 x weekly - 1 sets - 3 reps - 20-30 sec hold - Child's Pose with Sidebending  - 2 x daily - 7 x weekly - 1 sets - 2 reps - 60 sec hold - Standing Lumbar Spine Flexion Stretch Counter  - 1 x daily - 3 x weekly - 2 sets - 10 reps - Hooklying Sequential Leg March and Lower  - 1 x daily - 3 x weekly - 2 sets - 10 reps - Supine 90/90 Alternating Heel Touches with Posterior Pelvic Tilt  - 1 x daily - 3 x weekly - 2 sets - 10 reps - Prone Hip Extension  - 1 x daily - 3 x weekly - 2 sets - 10 reps - Curl Up with Arms Crossed  - 1 x daily - 3 x weekly - 2 sets - 10 reps - Curl Up with Reach  - 1 x daily - 3 x weekly - 2 sets - 10 reps - Supine Transversus Abdominis Bracing with Leg Extension  - 1 x daily - 3 x weekly - 2 sets - 10 reps - Standing Hip Abduction with Resistance at Ankles and Counter Support  - 1 x daily - 3 x weekly - 2 sets - 10 reps - Standing Hip Extension with Resistance at Ankles and Counter Support  - 1 x daily - 3 x weekly - 2 sets - 10 reps  ASSESSMENT:  CLINICAL IMPRESSION: Tashya had gel injection in her L knee this morning so we avoided any knee stress today with TE. She was able to progress many exercises with increased resistance without complaint. We spent a long time working on isolating her pelvic floor and TA and coordinating breathing today per her request.   OBJECTIVE IMPAIRMENTS: decreased activity tolerance, decreased ROM, decreased strength, hypomobility, increased muscle spasms, impaired flexibility, postural dysfunction, and pain.   ACTIVITY LIMITATIONS: lifting, sleeping, dressing, and locomotion level  PARTICIPATION LIMITATIONS: community activity  PERSONAL FACTORS: Age, Fitness, and 1  comorbidity: R knee pain  are also affecting patient's functional outcome.   REHAB POTENTIAL: Excellent  CLINICAL DECISION MAKING: Stable/uncomplicated  EVALUATION COMPLEXITY: Low   GOALS: Goals reviewed with patient? Yes  SHORT TERM GOALS: Target date: 02/19/2024   Patient will be independent with initial HEP.  Baseline:  Goal status: IN PROGRESS (partially compliant)  2.  Decreased pain by 25% in the morning.  Baseline:  Goal status: MET  LONG TERM GOALS: Target date: 03/11/2024   Patient will be independent with advanced/ongoing HEP to improve outcomes and carryover.  Baseline:  Goal status: INITIAL  2.  Patient will be able to achieve fig 4 position without manual assistance due to improved Lt hip ROM and strength.  Baseline:  Goal status: INITIAL  3.  Patient will be able to sleep without waking from pain  Baseline:  Goal status: MET  4.  Patient will demonstrate improved LE strength as evidenced by decreased 5xSTS by 2-3 seconds. Baseline: 16.8 sec Goal status: INITIAL  5.  Patient will report decreased pain with walking by 50% or more. Baseline:  Goal status: MET    PLAN:  PT FREQUENCY: 2x/week  PT DURATION: 6 weeks  PLANNED INTERVENTIONS: 97164- PT Re-evaluation, 97110-Therapeutic exercises, 97530- Therapeutic activity, 97112- Neuromuscular re-education, 97535- Self Care, [id number]- Manual therapy, 97014- Electrical stimulation (unattended), Patient/Family education, Taping, Dry Needling, Joint mobilization, Spinal mobilization, Cryotherapy, and Moist heat.  PLAN FOR NEXT SESSION: continue core and LE strengthening progressing to more standing exercises as tolerated,  L hip flexibility, B quads/hip flexors   Solon Palm, PT  02/22/2024, 1:24 PM

## 2024-02-26 ENCOUNTER — Ambulatory Visit: Payer: PPO | Admitting: Physical Therapy

## 2024-02-26 ENCOUNTER — Encounter: Payer: Self-pay | Admitting: Physical Therapy

## 2024-02-26 DIAGNOSIS — R252 Cramp and spasm: Secondary | ICD-10-CM

## 2024-02-26 DIAGNOSIS — M5459 Other low back pain: Secondary | ICD-10-CM | POA: Diagnosis not present

## 2024-02-26 DIAGNOSIS — M6281 Muscle weakness (generalized): Secondary | ICD-10-CM

## 2024-02-26 NOTE — Therapy (Signed)
 OUTPATIENT PHYSICAL THERAPY THORACOLUMBAR TREATMENT   Patient Name: Kimberly Ryan MRN: [id number] DOB:Mar 07, 1951, 73 y.o., female Today's Date: 02/26/2024  END OF SESSION:  PT End of Session - 02/26/24 1023     Visit Number 7    Date for PT Re-Evaluation 03/11/24    Authorization Type HTA    Progress Note Due on Visit 10    PT Start Time 1020    PT Stop Time 1100    PT Time Calculation (min) 40 min    Activity Tolerance Patient tolerated treatment well    Behavior During Therapy WFL for tasks assessed/performed                  Past Medical History:  Diagnosis Date   Anxiety    Arthritis    GERD (gastroesophageal reflux disease)    Headache(784.0)    Shortness of breath    with exertion    Thyroid disease    Past Surgical History:  Procedure Laterality Date   MYOMECTOMY  2000   RHINOPLASTY  1953   THYROID LOBECTOMY  08/17/2012   Procedure: THYROID LOBECTOMY;  Surgeon: Atilano Ina, MD,FACS;  Location: [facility];  Service: General;  Laterality: Left;  Left Thyroid Lobectomy   TONSILLECTOMY  1954   Patient Active Problem List   Diagnosis Date Noted   OP (osteoporosis) 01/06/2023   Postsurgical hypothyroidism 11/04/2012   Multiple thyroid nodules 12/19/2011    PCP: Inez Pilgrim, NP   REFERRING PROVIDER: Soundra Pilon, FNP   REFERRING DIAG: M54.50 (ICD-10-CM) - Low back pain, unspecified   Rationale for Evaluation and Treatment: Rehabilitation  THERAPY DIAG:  Other low back pain  Muscle weakness (generalized)  Cramp and spasm  ONSET DATE: 2 weeks  SUBJECTIVE:                                                                                                                                                                                           SUBJECTIVE STATEMENT: I have had a very busy week and my back has held up well.  I get a few twinges in the Rt hip here and there but not too bad.  I am feeling stronger.  My new pain range is  0-3 and the 3/10 is with a movement or position and the pain doesn't last.  PERTINENT HISTORY:  Low back pain,  SOB with exertion/stairs, R knee pain  PAIN:  Are you having pain? Yes: NPRS scale: 0-3/10 Pain location: low back maybe a little to the right Pain description: achy pain Aggravating factors: lying down Relieving factors: DKTC, moving around  PRECAUTIONS: None  RED FLAGS: None   WEIGHT BEARING RESTRICTIONS: No  FALLS:  Has patient fallen in last 6 months? Yes. Number of falls 1 walking in the dark and bumped into something in the hall and fell on her knees  LIVING ENVIRONMENT: Lives with: lives with their spouse Lives in: House/apartment  OCCUPATION: Engineer, agricultural and church organist  PLOF: Independent, Vocation/Vocational requirements: sitting, and Leisure: walks daily   PATIENT GOALS: to get better, increase strength in low back  NEXT MD VISIT: none scheduled  OBJECTIVE:  Note: Objective measures were completed at Evaluation unless otherwise noted.  DIAGNOSTIC FINDINGS:  none  PATIENT SURVEYS:  Modified Oswestry  3 / 50 = 6.0 %   COGNITION: Overall cognitive status: Within functional limits for tasks assessed     SENSATION: WFL  MUSCLE LENGTH: Tightness in B lumbar paraspinals, L>R quads, L hip flexors,  L piriformis, gastrocs   POSTURE: rounded shoulders, forward head, and mild R lumbar convexity  PALPATION: Palpation: TTP at B gluteals. Increased tissue tension in B lumbar. Spinal Mobility: R L2/3 some pain with UPA mob   LUMBAR ROM: some pain in low back with prone ext  AROM eval  Flexion Hands to mid shin limited by HS  Extension   Right lateral flexion 9 some pain in R glute  Left lateral flexion 12  Right rotation Full   Left rotation full   (Blank rows = not tested)  LOWER EXTREMITY ROM:   knee flexion limited by tight quads L > R  Active  Right eval Left eval  Hip flexion    Hip extension    Hip abduction    Hip  adduction    Hip internal rotation  WNL  Hip external rotation  WNL  Knee flexion    Knee extension    Ankle dorsiflexion    Ankle plantarflexion    Ankle inversion    Ankle eversion     (Blank rows = not tested)  LOWER EXTREMITY MMT:    MMT Right eval Left eval  Hip flexion 4+ 5  Hip extension 4+ 4  Hip abduction    Hip adduction    Hip internal rotation    Hip external rotation    Knee flexion 4+ 5  Knee extension 5 5  Ankle dorsiflexion 5 5  Ankle plantarflexion    Ankle inversion    Ankle eversion     (Blank rows = not tested)  FUNCTIONAL TESTS:  5 times sit to stand: 16.8 sec  SLS R 19 sec L 14 sec     EC  feet together and apart x 30 sec ea   TREATMENT DATE:                                                                                                                                [facility] Adult PT Treatment:  DATE: 02/26/24  NuStep L5x5' PT present to discuss status TA sequential march x 5 B SLR x10 bil with TA awareness, PT cued exhale on lifting phase 90/90 toe taps x 10 ea 90/90 with alt leg press 1x10 B - added blue loop around feet today 90/90 LE with bil shoulder flexion holding 5lb dumbbell x10 Dying bug holding bil 3lb in UE x20 SL clam and reverse clam with red loop band 2x10 each, bil Squat to mat table with sidestepping red loop above knees edge of mat table to edge of mat table x 5 passes  Standing 10lb KB at side march alt LE standing on airex pad with single UE support x20 Prone hip extension alt LE 2x20 with pelvic press and TA indraw  [facility] Adult PT Treatment:                                                DATE: 02/22/24    Neuro re-ed: Pelvic floor contraction with 3 sec exhale x 10 Long discussion and trials of breathing to separate pelvic floor from TA TA sequential march x 5 B 90/90 toe taps x 10 ea 90/90 with alt leg press 1x10 B Oblique crunches with reaches to opp knee in 90/90 position x  10 B S/L clams red loop with TA contraction and focused on breathing 2x10 B Reverse clam red loop (kept above knees due to injection) 2x10 B Prone pelvic press with hip ext 2 x 10 B Standing TA with hip ABD and 45 deg ext 2x 10 ea B red loop at thighs (due to knee injection)   [facility] Adult PT Treatment:                                                DATE: 02/19/24  NuStep L5 x 5' PT present to discuss status Seated fig 4 edge of mat table (modified pigeon) x20" bil Seated fig 4 cross leg pull and push x30"  Neuro re-ed: TA sequential march x 5 B 90/90 toe taps x 10 ea 90/90 with alt leg press 2x 5 B Oblique crunches with reaches to opp knee in 90/90 position x 10 B Prone pelvic press with hip ext red loop on thighs 2 x 10 B Standing marching unilateral 10#KB x 10 B Standing TA with hip ABD and 45 deg ext 2x 10 ea B yellow loop at ankles Therapeutic Activity: Sit to stand with red loop x 10  Side step red loop x 6    [facility] Adult PT Treatment:                                                DATE: 02/17/24  Therapeutic Exercise: Nustep L5 x  5 min  PT present to review status Seated fig 4 2 x 30 sec ea, L cross knee pull x 30 sec Neuromuscular re-ed: TA sequential march x 5 B 90/90 toe taps x 10 ea 90/90 with alt leg press 2x 5 B Oblique crunches with reaches to opp knee in 90/90 position x 10 B Prone pelvic press with  hip ext 2 x 10 B Standing marching unilateral 10#KB x 10 B Standing TA with hip ABD and 45 deg ext 2x 10 ea B yellow loop at ankles Therapeutic Activity: Sit to stand with red loop x 10  Side step red loop x 6    PATIENT EDUCATION:  Education details: PT eval findings, anticipated POC, initial HEP, and tips to reduce falls  Person educated: Patient Education method: Explanation, Demonstration, and Handouts Education comprehension: verbalized understanding and returned demonstration  HOME EXERCISE PROGRAM: Access Code: [email or web address] URL:  [email or web address] Date: 02/17/2024 Prepared by: Raynelle Fanning  Exercises - Seated Piriformis Stretch with Trunk Bend  - 2 x daily - 7 x weekly - 1 sets - 3 reps - 30-60 sec  hold - Child's Pose Stretch  - 1 x daily - 7 x weekly - 1 sets - 3 reps - 20-30 sec hold - Child's Pose with Sidebending  - 2 x daily - 7 x weekly - 1 sets - 2 reps - 60 sec hold - Standing Lumbar Spine Flexion Stretch Counter  - 1 x daily - 3 x weekly - 2 sets - 10 reps - Hooklying Sequential Leg March and Lower  - 1 x daily - 3 x weekly - 2 sets - 10 reps - Supine 90/90 Alternating Heel Touches with Posterior Pelvic Tilt  - 1 x daily - 3 x weekly - 2 sets - 10 reps - Prone Hip Extension  - 1 x daily - 3 x weekly - 2 sets - 10 reps - Curl Up with Arms Crossed  - 1 x daily - 3 x weekly - 2 sets - 10 reps - Curl Up with Reach  - 1 x daily - 3 x weekly - 2 sets - 10 reps - Supine Transversus Abdominis Bracing with Leg Extension  - 1 x daily - 3 x weekly - 2 sets - 10 reps - Standing Hip Abduction with Resistance at Ankles and Counter Support  - 1 x daily - 3 x weekly - 2 sets - 10 reps - Standing Hip Extension with Resistance at Ankles and Counter Support  - 1 x daily - 3 x weekly - 2 sets - 10 reps  ASSESSMENT:  CLINICAL IMPRESSION: Sanjna reports mostly painfree throughout a busy week this week.  She has brief Rt posterior hip pains with movement or positioning that touch 3/10, so pain range experience is now much less frequent and ranges from 0-3/10.  She was able to progress her core stabilization today to using added dumbbells and bands with mat based exercises, and stood on airex pad for suitcase hold marches.  OBJECTIVE IMPAIRMENTS: decreased activity tolerance, decreased ROM, decreased strength, hypomobility, increased muscle spasms, impaired flexibility, postural dysfunction, and pain.   ACTIVITY LIMITATIONS: lifting, sleeping, dressing, and locomotion level  PARTICIPATION LIMITATIONS: community  activity  PERSONAL FACTORS: Age, Fitness, and 1 comorbidity: R knee pain  are also affecting patient's functional outcome.   REHAB POTENTIAL: Excellent  CLINICAL DECISION MAKING: Stable/uncomplicated  EVALUATION COMPLEXITY: Low   GOALS: Goals reviewed with patient? Yes  SHORT TERM GOALS: Target date: 02/19/2024   Patient will be independent with initial HEP.  Baseline:  Goal status: IN PROGRESS (partially compliant)  2.  Decreased pain by 25% in the morning.  Baseline:  Goal status: MET   LONG TERM GOALS: Target date: 03/11/2024   Patient will be independent with advanced/ongoing HEP to improve outcomes and carryover.  Baseline:  Goal status: INITIAL  2.  Patient  will be able to achieve fig 4 position without manual assistance due to improved Lt hip ROM and strength.  Baseline:  Goal status: INITIAL  3.  Patient will be able to sleep without waking from pain  Baseline:  Goal status: MET  4.  Patient will demonstrate improved LE strength as evidenced by decreased 5xSTS by 2-3 seconds. Baseline: 16.8 sec Goal status: INITIAL  5.  Patient will report decreased pain with walking by 50% or more. Baseline:  Goal status: MET    PLAN:  PT FREQUENCY: 2x/week  PT DURATION: 6 weeks  PLANNED INTERVENTIONS: 97164- PT Re-evaluation, 97110-Therapeutic exercises, 97530- Therapeutic activity, 97112- Neuromuscular re-education, 97535- Self Care, 98119- Manual therapy, 97014- Electrical stimulation (unattended), Patient/Family education, Taping, Dry Needling, Joint mobilization, Spinal mobilization, Cryotherapy, and Moist heat.  PLAN FOR NEXT SESSION: check 5x STS for LTG, continue core and LE strengthening progressing to more standing exercises as tolerated,  L hip flexibility, B quads/hip flexors   Enyah Moman, PT 02/26/24 12:08 PM

## 2024-02-29 ENCOUNTER — Ambulatory Visit: Payer: PPO | Admitting: Physical Therapy

## 2024-02-29 DIAGNOSIS — M5459 Other low back pain: Secondary | ICD-10-CM | POA: Diagnosis not present

## 2024-02-29 DIAGNOSIS — M1711 Unilateral primary osteoarthritis, right knee: Secondary | ICD-10-CM | POA: Diagnosis not present

## 2024-02-29 DIAGNOSIS — M6281 Muscle weakness (generalized): Secondary | ICD-10-CM

## 2024-02-29 NOTE — Therapy (Signed)
 OUTPATIENT PHYSICAL THERAPY THORACOLUMBAR TREATMENT   Patient Name: Kimberly Ryan MRN: [id number] DOB:05-Nov-1951, 73 y.o., female Today's Date: 02/29/2024  END OF SESSION:  PT End of Session - 02/29/24 1241     Visit Number 8    Date for PT Re-Evaluation 03/11/24    Authorization Type HTA    Progress Note Due on Visit 10    PT Start Time 1240    PT Stop Time 1322    PT Time Calculation (min) 42 min    Activity Tolerance Patient tolerated treatment well    Behavior During Therapy WFL for tasks assessed/performed                   Past Medical History:  Diagnosis Date   Anxiety    Arthritis    GERD (gastroesophageal reflux disease)    Headache(784.0)    Shortness of breath    with exertion    Thyroid disease    Past Surgical History:  Procedure Laterality Date   MYOMECTOMY  2000   RHINOPLASTY  1953   THYROID LOBECTOMY  08/17/2012   Procedure: THYROID LOBECTOMY;  Surgeon: Atilano Ina, MD,FACS;  Location: [facility];  Service: General;  Laterality: Left;  Left Thyroid Lobectomy   TONSILLECTOMY  1954   Patient Active Problem List   Diagnosis Date Noted   OP (osteoporosis) 01/06/2023   Postsurgical hypothyroidism 11/04/2012   Multiple thyroid nodules 12/19/2011    PCP: Inez Pilgrim, NP   REFERRING PROVIDER: Soundra Pilon, FNP   REFERRING DIAG: M54.50 (ICD-10-CM) - Low back pain, unspecified   Rationale for Evaluation and Treatment: Rehabilitation  THERAPY DIAG:  Other low back pain  Muscle weakness (generalized)  ONSET DATE: 2 weeks  SUBJECTIVE:                                                                                                                                                                                           SUBJECTIVE STATEMENT: My back is doing good.  PERTINENT HISTORY:  Low back pain,  SOB with exertion/stairs, R knee pain  PAIN:  Are you having pain? Yes: NPRS scale: 0/10 Pain location: low back maybe a  little to the right Pain description: achy pain Aggravating factors: lying down Relieving factors: DKTC, moving around  PRECAUTIONS: None  RED FLAGS: None   WEIGHT BEARING RESTRICTIONS: No  FALLS:  Has patient fallen in last 6 months? Yes. Number of falls 1 walking in the dark and bumped into something in the hall and fell on her knees  LIVING ENVIRONMENT: Lives with: lives with their spouse Lives in: House/apartment  OCCUPATION: piano  teacher and church organist  PLOF: Independent, Vocation/Vocational requirements: sitting, and Leisure: walks daily   PATIENT GOALS: to get better, increase strength in low back  NEXT MD VISIT: none scheduled  OBJECTIVE:  Note: Objective measures were completed at Evaluation unless otherwise noted.  DIAGNOSTIC FINDINGS:  none  PATIENT SURVEYS:  Modified Oswestry  3 / 50 = 6.0 %   COGNITION: Overall cognitive status: Within functional limits for tasks assessed     SENSATION: WFL  MUSCLE LENGTH: Tightness in B lumbar paraspinals, L>R quads, L hip flexors,  L piriformis, gastrocs   POSTURE: rounded shoulders, forward head, and mild R lumbar convexity  PALPATION: Palpation: TTP at B gluteals. Increased tissue tension in B lumbar. Spinal Mobility: R L2/3 some pain with UPA mob   LUMBAR ROM: some pain in low back with prone ext  AROM eval  Flexion Hands to mid shin limited by HS  Extension   Right lateral flexion 9 some pain in R glute  Left lateral flexion 12  Right rotation Full   Left rotation full   (Blank rows = not tested)  LOWER EXTREMITY ROM:   knee flexion limited by tight quads L > R  Active  Right eval Left eval  Hip flexion    Hip extension    Hip abduction    Hip adduction    Hip internal rotation  WNL  Hip external rotation  WNL  Knee flexion    Knee extension    Ankle dorsiflexion    Ankle plantarflexion    Ankle inversion    Ankle eversion     (Blank rows = not tested)  LOWER EXTREMITY MMT:     MMT Right eval Left eval  Hip flexion 4+ 5  Hip extension 4+ 4  Hip abduction    Hip adduction    Hip internal rotation    Hip external rotation    Knee flexion 4+ 5  Knee extension 5 5  Ankle dorsiflexion 5 5  Ankle plantarflexion    Ankle inversion    Ankle eversion     (Blank rows = not tested)  FUNCTIONAL TESTS:  5 times sit to stand: 16.8 sec  SLS R 19 sec L 14 sec     EC  feet together and apart x 30 sec ea   TREATMENT DATE:                                                                                                                                [facility] Adult PT Treatment:                                                DATE: 02/29/24  Started Nustep then stopped due pt having knee injection today B fig 4 stretch 2x30 sec B Standing TA  with hip ABD and 45 deg ext 2x 10 ea B red loop at thighs (due to knee injection) Sidestepping 12 steps x 4 red loop around thighs Pallof rotation small GTB x 10 B Pallof press GTB x 10 B TA sequential march x 5 B SLR x10 bil with TA awareness, PT cued exhale on lifting phase 90/90 LE with bil shoulder flexion holding 5lb dumbbell x10 then with one 5# wt x 10 Dying bug holding bil 3lb in UE x20 SL clam and reverse clam with red loop band 2x10 5 sec hold each, B Standing 10lb KB at side march alt LE standing on airex pad with single UE support x20 SLS with 10# KB in one hand on airex multiple reps Semi-tandem stance 10# KB around the waist pass x 10 B   [facility] Adult PT Treatment:                                                DATE: 02/26/24  NuStep L5x5' PT present to discuss status TA sequential march x 5 B SLR x10 bil with TA awareness, PT cued exhale on lifting phase 90/90 toe taps x 10 ea 90/90 with alt leg press 1x10 B - added blue loop around feet today 90/90 LE with bil shoulder flexion holding 5lb dumbbell x10 Dying bug holding bil 3lb in UE x20 SL clam and reverse clam with red loop band 2x10 each, bil Squat to mat table  with sidestepping red loop above knees edge of mat table to edge of mat table x 5 passes  Standing 10lb KB at side march alt LE standing on airex pad with single UE support x20 Prone hip extension alt LE 2x20 with pelvic press and TA indraw  [facility] Adult PT Treatment:                                                DATE: 02/22/24    Neuro re-ed: Pelvic floor contraction with 3 sec exhale x 10 Long discussion and trials of breathing to separate pelvic floor from TA TA sequential march x 5 B 90/90 toe taps x 10 ea 90/90 with alt leg press 1x10 B Oblique crunches with reaches to opp knee in 90/90 position x 10 B S/L clams red loop with TA contraction and focused on breathing 2x10 B Reverse clam red loop (kept above knees due to injection) 2x10 B Prone pelvic press with hip ext 2 x 10 B Standing TA with hip ABD and 45 deg ext 2x 10 ea B red loop at thighs (due to knee injection)   [facility] Adult PT Treatment:                                                DATE: 02/19/24  NuStep L5 x 5' PT present to discuss status Seated fig 4 edge of mat table (modified pigeon) x20" bil Seated fig 4 cross leg pull and push x30"  Neuro re-ed: TA sequential march x 5 B 90/90 toe taps x 10 ea 90/90 with alt leg press 2x 5 B Oblique crunches  with reaches to opp knee in 90/90 position x 10 B Prone pelvic press with hip ext red loop on thighs 2 x 10 B Standing marching unilateral 10#KB x 10 B Standing TA with hip ABD and 45 deg ext 2x 10 ea B yellow loop at ankles Therapeutic Activity: Sit to stand with red loop x 10  Side step red loop x 6    [facility] Adult PT Treatment:                                                DATE: 02/17/24  Therapeutic Exercise: Nustep L5 x  5 min  PT present to review status Seated fig 4 2 x 30 sec ea, L cross knee pull x 30 sec Neuromuscular re-ed: TA sequential march x 5 B 90/90 toe taps x 10 ea 90/90 with alt leg press 2x 5 B Oblique crunches with reaches to opp knee in 90/90  position x 10 B Prone pelvic press with hip ext 2 x 10 B Standing marching unilateral 10#KB x 10 B Standing TA with hip ABD and 45 deg ext 2x 10 ea B yellow loop at ankles Therapeutic Activity: Sit to stand with red loop x 10  Side step red loop x 6    PATIENT EDUCATION:  Education details: PT eval findings, anticipated POC, initial HEP, and tips to reduce falls  Person educated: Patient Education method: Explanation, Demonstration, and Handouts Education comprehension: verbalized understanding and returned demonstration  HOME EXERCISE PROGRAM: Access Code: [email or web address] URL: [email or web address] Date: 02/17/2024 Prepared by: Raynelle Fanning  Exercises - Seated Piriformis Stretch with Trunk Bend  - 2 x daily - 7 x weekly - 1 sets - 3 reps - 30-60 sec  hold - Child's Pose Stretch  - 1 x daily - 7 x weekly - 1 sets - 3 reps - 20-30 sec hold - Child's Pose with Sidebending  - 2 x daily - 7 x weekly - 1 sets - 2 reps - 60 sec hold - Standing Lumbar Spine Flexion Stretch Counter  - 1 x daily - 3 x weekly - 2 sets - 10 reps - Hooklying Sequential Leg March and Lower  - 1 x daily - 3 x weekly - 2 sets - 10 reps - Supine 90/90 Alternating Heel Touches with Posterior Pelvic Tilt  - 1 x daily - 3 x weekly - 2 sets - 10 reps - Prone Hip Extension  - 1 x daily - 3 x weekly - 2 sets - 10 reps - Curl Up with Arms Crossed  - 1 x daily - 3 x weekly - 2 sets - 10 reps - Curl Up with Reach  - 1 x daily - 3 x weekly - 2 sets - 10 reps - Supine Transversus Abdominis Bracing with Leg Extension  - 1 x daily - 3 x weekly - 2 sets - 10 reps - Standing Hip Abduction with Resistance at Ankles and Counter Support  - 1 x daily - 3 x weekly - 2 sets - 10 reps - Standing Hip Extension with Resistance at Ankles and Counter Support  - 1 x daily - 3 x weekly - 2 sets - 10 reps  ASSESSMENT:  CLINICAL IMPRESSION: Franklin had her second R knee injection today, so we did exercises that avoided stress to knee. 5XSTS  should be assessed next visit. She  reported mild back pain with 90/90 B 5# OH flexion by the last few reps which may have just been fatigue from doing dying bugs first. She is progressing well overall with core strengthening and continues to deny back pain.   OBJECTIVE IMPAIRMENTS: decreased activity tolerance, decreased ROM, decreased strength, hypomobility, increased muscle spasms, impaired flexibility, postural dysfunction, and pain.   ACTIVITY LIMITATIONS: lifting, sleeping, dressing, and locomotion level  PARTICIPATION LIMITATIONS: community activity  PERSONAL FACTORS: Age, Fitness, and 1 comorbidity: R knee pain  are also affecting patient's functional outcome.   REHAB POTENTIAL: Excellent  CLINICAL DECISION MAKING: Stable/uncomplicated  EVALUATION COMPLEXITY: Low   GOALS: Goals reviewed with patient? Yes  SHORT TERM GOALS: Target date: 02/19/2024   Patient will be independent with initial HEP.  Baseline:  Goal status: IN PROGRESS (partially compliant)  2.  Decreased pain by 25% in the morning.  Baseline:  Goal status: MET   LONG TERM GOALS: Target date: 03/11/2024   Patient will be independent with advanced/ongoing HEP to improve outcomes and carryover.  Baseline:  Goal status: INITIAL  2.  Patient will be able to achieve fig 4 position without manual assistance due to improved Lt hip ROM and strength.  Baseline:  Goal status: INITIAL  3.  Patient will be able to sleep without waking from pain  Baseline:  Goal status: MET  4.  Patient will demonstrate improved LE strength as evidenced by decreased 5xSTS by 2-3 seconds. Baseline: 16.8 sec Goal status: INITIAL  5.  Patient will report decreased pain with walking by 50% or more. Baseline:  Goal status: MET    PLAN:  PT FREQUENCY: 2x/week  PT DURATION: 6 weeks  PLANNED INTERVENTIONS: 97164- PT Re-evaluation, 97110-Therapeutic exercises, 97530- Therapeutic activity, 97112- Neuromuscular re-education, 97535-  Self Care, 16109- Manual therapy, 97014- Electrical stimulation (unattended), Patient/Family education, Taping, Dry Needling, Joint mobilization, Spinal mobilization, Cryotherapy, and Moist heat.  PLAN FOR NEXT SESSION: check 5x STS for LTG, continue core and LE strengthening progressing to more standing exercises as tolerated,  L hip flexibility, B quads/hip flexors   Solon Palm, PT  02/29/24 1:29 PM

## 2024-03-04 ENCOUNTER — Encounter: Payer: Self-pay | Admitting: Physical Therapy

## 2024-03-04 ENCOUNTER — Ambulatory Visit: Payer: PPO | Admitting: Physical Therapy

## 2024-03-04 DIAGNOSIS — R7303 Prediabetes: Secondary | ICD-10-CM | POA: Diagnosis not present

## 2024-03-04 DIAGNOSIS — M5459 Other low back pain: Secondary | ICD-10-CM

## 2024-03-04 DIAGNOSIS — R0602 Shortness of breath: Secondary | ICD-10-CM | POA: Diagnosis not present

## 2024-03-04 DIAGNOSIS — M6281 Muscle weakness (generalized): Secondary | ICD-10-CM

## 2024-03-04 DIAGNOSIS — F411 Generalized anxiety disorder: Secondary | ICD-10-CM | POA: Diagnosis not present

## 2024-03-04 DIAGNOSIS — Z6833 Body mass index (BMI) 33.0-33.9, adult: Secondary | ICD-10-CM | POA: Diagnosis not present

## 2024-03-04 NOTE — Therapy (Signed)
 OUTPATIENT PHYSICAL THERAPY THORACOLUMBAR TREATMENT   Patient Name: Kimberly Ryan MRN: [id number] DOB:08-03-1951, 73 y.o., female Today's Date: 03/04/2024  END OF SESSION:  PT End of Session - 03/04/24 0938     Visit Number 9    Date for PT Re-Evaluation 03/11/24    Authorization Type HTA    Progress Note Due on Visit 10    PT Start Time 0935    PT Stop Time 1015    PT Time Calculation (min) 40 min    Activity Tolerance Patient tolerated treatment well    Behavior During Therapy WFL for tasks assessed/performed                    Past Medical History:  Diagnosis Date   Anxiety    Arthritis    GERD (gastroesophageal reflux disease)    Headache(784.0)    Shortness of breath    with exertion    Thyroid disease    Past Surgical History:  Procedure Laterality Date   MYOMECTOMY  2000   RHINOPLASTY  1953   THYROID LOBECTOMY  08/17/2012   Procedure: THYROID LOBECTOMY;  Surgeon: Atilano Ina, MD,FACS;  Location: [facility];  Service: General;  Laterality: Left;  Left Thyroid Lobectomy   TONSILLECTOMY  1954   Patient Active Problem List   Diagnosis Date Noted   OP (osteoporosis) 01/06/2023   Postsurgical hypothyroidism 11/04/2012   Multiple thyroid nodules 12/19/2011    PCP: Inez Pilgrim, NP   REFERRING PROVIDER: Soundra Pilon, FNP   REFERRING DIAG: M54.50 (ICD-10-CM) - Low back pain, unspecified   Rationale for Evaluation and Treatment: Rehabilitation  THERAPY DIAG:  Other low back pain  Muscle weakness (generalized)  ONSET DATE: 2 weeks  SUBJECTIVE:                                                                                                                                                                                           SUBJECTIVE STATEMENT: My back continues to do well.  If I get into a certain position I can feel the pain in my Rt low back/buttock.  It is usually very fleeting and when I'm transitioning across multiple planes  like going from laying down to standing.    PERTINENT HISTORY:  Low back pain,  SOB with exertion/stairs, R knee pain  PAIN:  Are you having pain? Yes: NPRS scale: 0/10 Pain location: low back maybe a little to the right Pain description: achy pain Aggravating factors: lying down Relieving factors: DKTC, moving around  PRECAUTIONS: None  RED FLAGS: None   WEIGHT BEARING RESTRICTIONS: No  FALLS:  Has  patient fallen in last 6 months? Yes. Number of falls 1 walking in the dark and bumped into something in the hall and fell on her knees  LIVING ENVIRONMENT: Lives with: lives with their spouse Lives in: House/apartment  OCCUPATION: Engineer, agricultural and church organist  PLOF: Independent, Vocation/Vocational requirements: sitting, and Leisure: walks daily   PATIENT GOALS: to get better, increase strength in low back  NEXT MD VISIT: none scheduled  OBJECTIVE:  Note: Objective measures were completed at Evaluation unless otherwise noted.  DIAGNOSTIC FINDINGS:  none  PATIENT SURVEYS:  Modified Oswestry  3 / 50 = 6.0 %   COGNITION: Overall cognitive status: Within functional limits for tasks assessed     SENSATION: WFL  MUSCLE LENGTH: Tightness in B lumbar paraspinals, L>R quads, L hip flexors,  L piriformis, gastrocs   POSTURE: rounded shoulders, forward head, and mild R lumbar convexity  PALPATION: Palpation: TTP at B gluteals. Increased tissue tension in B lumbar. Spinal Mobility: R L2/3 some pain with UPA mob   LUMBAR ROM: some pain in low back with prone ext  AROM eval  Flexion Hands to mid shin limited by HS  Extension   Right lateral flexion 9 some pain in R glute  Left lateral flexion 12  Right rotation Full   Left rotation full   (Blank rows = not tested)  LOWER EXTREMITY ROM:   knee flexion limited by tight quads L > R  Active  Right eval Left eval  Hip flexion    Hip extension    Hip abduction    Hip adduction    Hip internal rotation  WNL   Hip external rotation  WNL  Knee flexion    Knee extension    Ankle dorsiflexion    Ankle plantarflexion    Ankle inversion    Ankle eversion     (Blank rows = not tested)  LOWER EXTREMITY MMT:    MMT Right eval Left eval  Hip flexion 4+ 5  Hip extension 4+ 4  Hip abduction    Hip adduction    Hip internal rotation    Hip external rotation    Knee flexion 4+ 5  Knee extension 5 5  Ankle dorsiflexion 5 5  Ankle plantarflexion    Ankle inversion    Ankle eversion     (Blank rows = not tested)  FUNCTIONAL TESTS:  03/04/24:  5x STS: 15.97, 14.94  5 times sit to stand: 16.8 sec  SLS R 19 sec L 14 sec     EC  feet together and apart x 30 sec ea   TREATMENT DATE:                                                                                                                               [facility] Adult PT Treatment:  DATE: 03/04/24  L6 x 2' then L5 per Pt request x3' PT present to discuss status Seated fig 4 stretch 2x30" bil Lt seated ER A/ROM for strength to work on Ind with crossing Lt leg over Rt x10 5x STS two trials Supine sequential march to 90/90 x10  Dying bug holding bil 3lb in UE x20 Supine SLR x10 bil 90/90 LE with bil shoulder flexion holding 8lb dumbbell x10 Supine bridge holding 8lb dumbbell on anterior pelvis for added resistance x10 Sidestepping 12 steps x 4 red loop around thighs, 6 steps down and back with black loop - no trunk swaying Modified depth dead lift 10lb (to mid shin) x 10 reps Stagger stance on airex pad 10lb kbell pass arounds 5x each direction, each foot placement Red tband at ankles standing hip ext at 45 deg and hip abd 2x10 each bil Green tband pallof press, pallof rotation small range - 10x each  [facility] Adult PT Treatment:                                                DATE: 02/29/24  Started Nustep then stopped due pt having knee injection today B fig 4 stretch 2x30 sec B Standing TA with hip  ABD and 45 deg ext 2x 10 ea B red loop at thighs (due to knee injection) Sidestepping 12 steps x 4 red loop around thighs Pallof rotation small GTB x 10 B Pallof press GTB x 10 B TA sequential march x 5 B SLR x10 bil with TA awareness, PT cued exhale on lifting phase 90/90 LE with bil shoulder flexion holding 5lb dumbbell x10 then with one 5# wt x 10 Dying bug holding bil 3lb in UE x20 SL clam and reverse clam with red loop band 2x10 5 sec hold each, B Standing 10lb KB at side march alt LE standing on airex pad with single UE support x20 SLS with 10# KB in one hand on airex multiple reps Semi-tandem stance 10# KB around the waist pass x 10 B   [facility] Adult PT Treatment:                                                DATE: 02/26/24  NuStep L5x5' PT present to discuss status TA sequential march x 5 B SLR x10 bil with TA awareness, PT cued exhale on lifting phase 90/90 toe taps x 10 ea 90/90 with alt leg press 1x10 B - added blue loop around feet today 90/90 LE with bil shoulder flexion holding 5lb dumbbell x10 Dying bug holding bil 3lb in UE x20 SL clam and reverse clam with red loop band 2x10 each, bil Squat to mat table with sidestepping red loop above knees edge of mat table to edge of mat table x 5 passes  Standing 10lb KB at side march alt LE standing on airex pad with single UE support x20 Prone hip extension alt LE 2x20 with pelvic press and TA indraw  [facility] Adult PT Treatment:  DATE: 02/22/24    Neuro re-ed: Pelvic floor contraction with 3 sec exhale x 10 Long discussion and trials of breathing to separate pelvic floor from TA TA sequential march x 5 B 90/90 toe taps x 10 ea 90/90 with alt leg press 1x10 B Oblique crunches with reaches to opp knee in 90/90 position x 10 B S/L clams red loop with TA contraction and focused on breathing 2x10 B Reverse clam red loop (kept above knees due to injection) 2x10 B Prone pelvic press with hip  ext 2 x 10 B Standing TA with hip ABD and 45 deg ext 2x 10 ea B red loop at thighs (due to knee injection)   PATIENT EDUCATION:  Education details: PT eval findings, anticipated POC, initial HEP, and tips to reduce falls  Person educated: Patient Education method: Explanation, Demonstration, and Handouts Education comprehension: verbalized understanding and returned demonstration  HOME EXERCISE PROGRAM: Access Code: [email or web address] URL: [email or web address] Date: 02/17/2024 Prepared by: Raynelle Fanning  Exercises - Seated Piriformis Stretch with Trunk Bend  - 2 x daily - 7 x weekly - 1 sets - 3 reps - 30-60 sec  hold - Child's Pose Stretch  - 1 x daily - 7 x weekly - 1 sets - 3 reps - 20-30 sec hold - Child's Pose with Sidebending  - 2 x daily - 7 x weekly - 1 sets - 2 reps - 60 sec hold - Standing Lumbar Spine Flexion Stretch Counter  - 1 x daily - 3 x weekly - 2 sets - 10 reps - Hooklying Sequential Leg March and Lower  - 1 x daily - 3 x weekly - 2 sets - 10 reps - Supine 90/90 Alternating Heel Touches with Posterior Pelvic Tilt  - 1 x daily - 3 x weekly - 2 sets - 10 reps - Prone Hip Extension  - 1 x daily - 3 x weekly - 2 sets - 10 reps - Curl Up with Arms Crossed  - 1 x daily - 3 x weekly - 2 sets - 10 reps - Curl Up with Reach  - 1 x daily - 3 x weekly - 2 sets - 10 reps - Supine Transversus Abdominis Bracing with Leg Extension  - 1 x daily - 3 x weekly - 2 sets - 10 reps - Standing Hip Abduction with Resistance at Ankles and Counter Support  - 1 x daily - 3 x weekly - 2 sets - 10 reps - Standing Hip Extension with Resistance at Ankles and Counter Support  - 1 x daily - 3 x weekly - 2 sets - 10 reps  ASSESSMENT:  CLINICAL IMPRESSION: Vonita continues to make great progress toward her goals.  She met her 5x STS goal today reducing her intake time by 2 seconds.  She is mostly painfree in her low back with the occassional fleeting pain in Rt lumbar/buttock area when transitioning  across multiple planes such as going from supine to sit.  We discussed making sure she is using proper log roll to SL before coming to sit which helped within session today.  Added deadlifts today without pain.  Pt is likely readying for d/c at the end of next week which we discussed today.  OBJECTIVE IMPAIRMENTS: decreased activity tolerance, decreased ROM, decreased strength, hypomobility, increased muscle spasms, impaired flexibility, postural dysfunction, and pain.   ACTIVITY LIMITATIONS: lifting, sleeping, dressing, and locomotion level  PARTICIPATION LIMITATIONS: community activity  PERSONAL FACTORS: Age, Fitness, and 1 comorbidity: R knee pain  are  also affecting patient's functional outcome.   REHAB POTENTIAL: Excellent  CLINICAL DECISION MAKING: Stable/uncomplicated  EVALUATION COMPLEXITY: Low   GOALS: Goals reviewed with patient? Yes  SHORT TERM GOALS: Target date: 02/19/2024   Patient will be independent with initial HEP.  Baseline:  Goal status: IN PROGRESS (partially compliant)  2.  Decreased pain by 25% in the morning.  Baseline:  Goal status: MET   LONG TERM GOALS: Target date: 03/11/2024   Patient will be independent with advanced/ongoing HEP to improve outcomes and carryover.  Baseline:  Goal status: ongoing  2.  Patient will be able to achieve fig 4 position without manual assistance due to improved Lt hip ROM and strength.  Baseline:  Goal status: able to demo 3/21 but then needs manual repositioning  3.  Patient will be able to sleep without waking from pain  Baseline:  Goal status: MET  4.  Patient will demonstrate improved LE strength as evidenced by decreased 5xSTS by 2-3 seconds. Baseline: 16.8 sec Goal status: MET 3/21  5.  Patient will report decreased pain with walking by 50% or more. Baseline:  Goal status: MET    PLAN:  PT FREQUENCY: 2x/week  PT DURATION: 6 weeks  PLANNED INTERVENTIONS: 97164- PT Re-evaluation, 97110-Therapeutic  exercises, 97530- Therapeutic activity, 97112- Neuromuscular re-education, 97535- Self Care, 60454- Manual therapy, 97014- Electrical stimulation (unattended), Patient/Family education, Taping, Dry Needling, Joint mobilization, Spinal mobilization, Cryotherapy, and Moist heat.  PLAN FOR NEXT SESSION: 2 more visits and will likely be ready for d/c which we discussed, 10th visit PN next time, finalize HEP, continue core and LE strengthening progressing to more standing exercises as tolerated,  L hip flexibility, B quads/hip flexors   Paola Flynt, PT 03/04/24 12:11 PM

## 2024-03-07 ENCOUNTER — Ambulatory Visit: Payer: PPO | Admitting: Physical Therapy

## 2024-03-07 DIAGNOSIS — M1711 Unilateral primary osteoarthritis, right knee: Secondary | ICD-10-CM | POA: Diagnosis not present

## 2024-03-08 NOTE — Therapy (Signed)
 OUTPATIENT PHYSICAL THERAPY THORACOLUMBAR TREATMENT AND 10TH VISIT PROGRESS NOTE  Reporting Period 01/29/24 to 03/09/24   See note below for Objective Data and Assessment of Progress/Goals.    Patient Name: Kimberly Ryan MRN: [id number] DOB:[birth year], 73 y.o., female Today's Date: 03/09/2024  END OF SESSION:  PT End of Session - 03/09/24 1149     Visit Number 10    Date for PT Re-Evaluation 03/11/24    Authorization Type HTA    Progress Note Due on Visit 10    PT Start Time 1150    PT Stop Time 1230    PT Time Calculation (min) 40 min    Activity Tolerance Patient tolerated treatment well    Behavior During Therapy WFL for tasks assessed/performed                     Past Medical History:  Diagnosis Date   Anxiety    Arthritis    GERD (gastroesophageal reflux disease)    Headache(784.0)    Shortness of breath    with exertion    Thyroid disease    Past Surgical History:  Procedure Laterality Date   MYOMECTOMY  2000   RHINOPLASTY  1953   THYROID LOBECTOMY  08/17/2012   Procedure: THYROID LOBECTOMY;  Surgeon: Atilano Ina, MD,FACS;  Location: [facility];  Service: General;  Laterality: Left;  Left Thyroid Lobectomy   TONSILLECTOMY  1954   Patient Active Problem List   Diagnosis Date Noted   OP (osteoporosis) 01/06/2023   Postsurgical hypothyroidism 11/04/2012   Multiple thyroid nodules 12/19/2011    PCP: Inez Pilgrim, NP   REFERRING PROVIDER: Soundra Pilon, FNP   REFERRING DIAG: M54.50 (ICD-10-CM) - Low back pain, unspecified   Rationale for Evaluation and Treatment: Rehabilitation  THERAPY DIAG:  Other low back pain  Muscle weakness (generalized)  Cramp and spasm  ONSET DATE: 2 weeks  SUBJECTIVE:                                                                                                                                                                                           SUBJECTIVE STATEMENT: Still having pain when  walking.    PERTINENT HISTORY:  Low back pain,  SOB with exertion/stairs, R knee pain  PAIN:  Are you having pain? Yes: NPRS scale: 0/10 Pain location: low back maybe a little to the right Pain description: achy pain Aggravating factors: lying down Relieving factors: DKTC, moving around  PRECAUTIONS: None  RED FLAGS: None   WEIGHT BEARING RESTRICTIONS: No  FALLS:  Has patient fallen in last 6 months? Yes. Number of falls  1 walking in the dark and bumped into something in the hall and fell on her knees  LIVING ENVIRONMENT: Lives with: lives with their spouse Lives in: House/apartment  OCCUPATION: Engineer, agricultural and church organist  PLOF: Independent, Vocation/Vocational requirements: sitting, and Leisure: walks daily   PATIENT GOALS: to get better, increase strength in low back  NEXT MD VISIT: none scheduled  OBJECTIVE:  Note: Objective measures were completed at Evaluation unless otherwise noted.  DIAGNOSTIC FINDINGS:  none  PATIENT SURVEYS:  Modified Oswestry  3 / 50 = 6.0 %   COGNITION: Overall cognitive status: Within functional limits for tasks assessed     SENSATION: WFL  MUSCLE LENGTH: Tightness in B lumbar paraspinals, L>R quads, L hip flexors,  L piriformis, gastrocs   POSTURE: rounded shoulders, forward head, and mild R lumbar convexity  PALPATION: Palpation: TTP at B gluteals. Increased tissue tension in B lumbar. Spinal Mobility: R L2/3 some pain with UPA mob   LUMBAR ROM: some pain in low back with prone ext  AROM eval  Flexion Hands to mid shin limited by HS  Extension   Right lateral flexion 9 some pain in R glute  Left lateral flexion 12  Right rotation Full   Left rotation full   (Blank rows = not tested)  LOWER EXTREMITY ROM:   knee flexion limited by tight quads L > R  Active  Right eval Left eval  Hip flexion    Hip extension    Hip abduction    Hip adduction    Hip internal rotation  WNL  Hip external rotation  WNL   Knee flexion    Knee extension    Ankle dorsiflexion    Ankle plantarflexion    Ankle inversion    Ankle eversion     (Blank rows = not tested)  LOWER EXTREMITY MMT:    MMT Right eval Left eval  Hip flexion 4+ 5  Hip extension 4+ 4  Hip abduction    Hip adduction    Hip internal rotation    Hip external rotation    Knee flexion 4+ 5  Knee extension 5 5  Ankle dorsiflexion 5 5  Ankle plantarflexion    Ankle inversion    Ankle eversion     (Blank rows = not tested)  FUNCTIONAL TESTS:  03/04/24:  5x STS: 15.97, 14.94  5 times sit to stand: 16.8 sec  SLS R 19 sec L 14 sec     EC  feet together and apart x 30 sec ea   TREATMENT DATE:                                                                                                                               [facility] Adult PT Treatment:  DATE: 03/09/24  L6 x 2' then L5 per Pt request x3' PT present to discuss status Seated fig 4 stretch 2x30" bil Long leg distraction L 2x30 sec Lt seated ER A/ROM for strength to work on Ind with crossing Lt leg over Rt x10 Supine sequential march to 90/90 x10  Dying bug holding bil 3lb in UE 2 x 10 90/90 LE with bil shoulder flexion holding 8lb dumbbell 2 x10 Supine bridge holding 8lb dumbbell on anterior pelvis for added resistance x10 Modified depth dead lift 10lb (to mid shin) x 10 reps Stagger stance on airex pad 10lb kbell pass arounds 5x each direction, each foot placement Red tband at ankles standing hip ext at 45 deg and hip abd 2x10 each bil  [facility] Adult PT Treatment:                                                DATE: 03/04/24  L6 x 2' then L5 per Pt request x3' PT present to discuss status Seated fig 4 stretch 2x30" bil Lt seated ER A/ROM for strength to work on Ind with crossing Lt leg over Rt x10 5x STS two trials Supine sequential march to 90/90 x10  Dying bug holding bil 3lb in UE x20 Supine SLR x10 bil 90/90 LE with bil  shoulder flexion holding 8lb dumbbell x10 Supine bridge holding 8lb dumbbell on anterior pelvis for added resistance x10 Sidestepping 12 steps x 4 red loop around thighs, 6 steps down and back with black loop - no trunk swaying Modified depth dead lift 10lb (to mid shin) x 10 reps Stagger stance on airex pad 10lb kbell pass arounds 5x each direction, each foot placement Red tband at ankles standing hip ext at 45 deg and hip abd 2x10 each bil Green tband pallof press, pallof rotation small range - 10x each  [facility] Adult PT Treatment:                                                DATE: 02/29/24  Started Nustep then stopped due pt having knee injection today B fig 4 stretch 2x30 sec B Standing TA with hip ABD and 45 deg ext 2x 10 ea B red loop at thighs (due to knee injection) Sidestepping 12 steps x 4 red loop around thighs Pallof rotation small GTB x 10 B Pallof press GTB x 10 B TA sequential march x 5 B SLR x10 bil with TA awareness, PT cued exhale on lifting phase 90/90 LE with bil shoulder flexion holding 5lb dumbbell x10 then with one 5# wt x 10 Dying bug holding bil 3lb in UE x20 SL clam and reverse clam with red loop band 2x10 5 sec hold each, B Standing 10lb KB at side march alt LE standing on airex pad with single UE support x20 SLS with 10# KB in one hand on airex multiple reps Semi-tandem stance 10# KB around the waist pass x 10 B   [facility] Adult PT Treatment:  DATE: 02/26/24  NuStep L5x5' PT present to discuss status TA sequential march x 5 B SLR x10 bil with TA awareness, PT cued exhale on lifting phase 90/90 toe taps x 10 ea 90/90 with alt leg press 1x10 B - added blue loop around feet today 90/90 LE with bil shoulder flexion holding 5lb dumbbell x10 Dying bug holding bil 3lb in UE x20 SL clam and reverse clam with red loop band 2x10 each, bil Squat to mat table with sidestepping red loop above knees edge of mat table to edge of mat  table x 5 passes  Standing 10lb KB at side march alt LE standing on airex pad with single UE support x20 Prone hip extension alt LE 2x20 with pelvic press and TA indraw  [facility] Adult PT Treatment:                                                DATE: 02/22/24    Neuro re-ed: Pelvic floor contraction with 3 sec exhale x 10 Long discussion and trials of breathing to separate pelvic floor from TA TA sequential march x 5 B 90/90 toe taps x 10 ea 90/90 with alt leg press 1x10 B Oblique crunches with reaches to opp knee in 90/90 position x 10 B S/L clams red loop with TA contraction and focused on breathing 2x10 B Reverse clam red loop (kept above knees due to injection) 2x10 B Prone pelvic press with hip ext 2 x 10 B Standing TA with hip ABD and 45 deg ext 2x 10 ea B red loop at thighs (due to knee injection)   PATIENT EDUCATION:  Education details: PT eval findings, anticipated POC, initial HEP, and tips to reduce falls  Person educated: Patient Education method: Explanation, Demonstration, and Handouts Education comprehension: verbalized understanding and returned demonstration  HOME EXERCISE PROGRAM: Access Code: [email or web address] URL: [email or web address] Date: 02/17/2024 Prepared by: Raynelle Fanning  Exercises - Seated Piriformis Stretch with Trunk Bend  - 2 x daily - 7 x weekly - 1 sets - 3 reps - 30-60 sec  hold - Child's Pose Stretch  - 1 x daily - 7 x weekly - 1 sets - 3 reps - 20-30 sec hold - Child's Pose with Sidebending  - 2 x daily - 7 x weekly - 1 sets - 2 reps - 60 sec hold - Standing Lumbar Spine Flexion Stretch Counter  - 1 x daily - 3 x weekly - 2 sets - 10 reps - Hooklying Sequential Leg March and Lower  - 1 x daily - 3 x weekly - 2 sets - 10 reps - Supine 90/90 Alternating Heel Touches with Posterior Pelvic Tilt  - 1 x daily - 3 x weekly - 2 sets - 10 reps - Prone Hip Extension  - 1 x daily - 3 x weekly - 2 sets - 10 reps - Curl Up with Arms Crossed  - 1 x daily - 3 x  weekly - 2 sets - 10 reps - Curl Up with Reach  - 1 x daily - 3 x weekly - 2 sets - 10 reps - Supine Transversus Abdominis Bracing with Leg Extension  - 1 x daily - 3 x weekly - 2 sets - 10 reps - Standing Hip Abduction with Resistance at Ankles and Counter Support  - 1 x daily - 3 x weekly - 2  sets - 10 reps - Standing Hip Extension with Resistance at Ankles and Counter Support  - 1 x daily - 3 x weekly - 2 sets - 10 reps  ASSESSMENT:  CLINICAL IMPRESSION:  Carmela continues to make great progress toward her goals.  She is mostly painfree in her low back with the occassional fleeting pain in Rt lumbar/buttock area when transitioning across multiple planes such as going from supine to sit.   She has ER restrictions in the left hip which appears muscular and her range should continue to improve if she keeps up with her stretching. She is for the most part compliant with her HEP. She has a lot of exercises, but feels they are all beneficial. She should be ready for d/c at next visit.   OBJECTIVE IMPAIRMENTS: decreased activity tolerance, decreased ROM, decreased strength, hypomobility, increased muscle spasms, impaired flexibility, postural dysfunction, and pain.   ACTIVITY LIMITATIONS: lifting, sleeping, dressing, and locomotion level  PARTICIPATION LIMITATIONS: community activity  PERSONAL FACTORS: Age, Fitness, and 1 comorbidity: R knee pain  are also affecting patient's functional outcome.   REHAB POTENTIAL: Excellent  CLINICAL DECISION MAKING: Stable/uncomplicated  EVALUATION COMPLEXITY: Low   GOALS: Goals reviewed with patient? Yes  SHORT TERM GOALS: Target date: 02/19/2024   Patient will be independent with initial HEP.  Baseline:  Goal status: IN PROGRESS (partially compliant)  2.  Decreased pain by 25% in the morning.  Baseline:  Goal status: MET   LONG TERM GOALS: Target date: 03/11/2024   Patient will be independent with advanced/ongoing HEP to improve outcomes and  carryover.  Baseline:  Goal status: ongoing  2.  Patient will be able to achieve fig 4 position without manual assistance due to improved Lt hip ROM and strength.  Baseline:  Goal status: able to demo 3/21 but then needs manual repositioning  3.  Patient will be able to sleep without waking from pain  Baseline:  Goal status: MET  4.  Patient will demonstrate improved LE strength as evidenced by decreased 5xSTS by 2-3 seconds. Baseline: 16.8 sec Goal status: MET 3/21  5.  Patient will report decreased pain with walking by 50% or more. Baseline:  Goal status: MET    PLAN:  PT FREQUENCY: 2x/week  PT DURATION: 6 weeks  PLANNED INTERVENTIONS: 97164- PT Re-evaluation, 97110-Therapeutic exercises, 97530- Therapeutic activity, 97112- Neuromuscular re-education, 97535- Self Care, [id number]- Manual therapy, 97014- Electrical stimulation (unattended), Patient/Family education, Taping, Dry Needling, Joint mobilization, Spinal mobilization, Cryotherapy, and Moist heat.  PLAN FOR NEXT SESSION: D/C to HEP   Solon Palm, PT  03/09/24 12:36 PM

## 2024-03-09 ENCOUNTER — Encounter: Payer: Self-pay | Admitting: Physical Therapy

## 2024-03-09 ENCOUNTER — Ambulatory Visit: Admitting: Physical Therapy

## 2024-03-09 DIAGNOSIS — M6281 Muscle weakness (generalized): Secondary | ICD-10-CM

## 2024-03-09 DIAGNOSIS — R252 Cramp and spasm: Secondary | ICD-10-CM

## 2024-03-09 DIAGNOSIS — M5459 Other low back pain: Secondary | ICD-10-CM | POA: Diagnosis not present

## 2024-03-10 NOTE — Therapy (Signed)
 OUTPATIENT PHYSICAL THERAPY THORACOLUMBAR TREATMENT AND DISCHARGE SUMMARY    Patient Name: Kimberly Ryan MRN: [id number] DOB:[birth year], 73 y.o., female Today's Date: 03/11/2024  END OF SESSION:  PT End of Session - 03/11/24 1025     Visit Number 11    Date for PT Re-Evaluation 03/11/24    Authorization Type HTA    Progress Note Due on Visit 10    PT Start Time 1022    PT Stop Time 1102    PT Time Calculation (min) 40 min    Activity Tolerance Patient tolerated treatment well    Behavior During Therapy WFL for tasks assessed/performed                      Past Medical History:  Diagnosis Date   Anxiety    Arthritis    GERD (gastroesophageal reflux disease)    Headache(784.0)    Shortness of breath    with exertion    Thyroid disease    Past Surgical History:  Procedure Laterality Date   MYOMECTOMY  2000   RHINOPLASTY  1953   THYROID LOBECTOMY  08/17/2012   Procedure: THYROID LOBECTOMY;  Surgeon: Atilano Ina, MD,FACS;  Location: [facility];  Service: General;  Laterality: Left;  Left Thyroid Lobectomy   TONSILLECTOMY  1954   Patient Active Problem List   Diagnosis Date Noted   OP (osteoporosis) 01/06/2023   Postsurgical hypothyroidism 11/04/2012   Multiple thyroid nodules 12/19/2011    PCP: Inez Pilgrim, NP   REFERRING PROVIDER: Soundra Pilon, FNP   REFERRING DIAG: M54.50 (ICD-10-CM) - Low back pain, unspecified   Rationale for Evaluation and Treatment: Rehabilitation  THERAPY DIAG:  Other low back pain  Muscle weakness (generalized)  Cramp and spasm  ONSET DATE: 2 weeks  SUBJECTIVE:                                                                                                                                                                                           SUBJECTIVE STATEMENT: Feeling so much better. I was having SOB with walking and I figured out it was a "remedy" I was using that I wasn't digesting well. No pain  with with walks.  PERTINENT HISTORY:  Low back pain,  SOB with exertion/stairs, R knee pain  PAIN:  Are you having pain? Yes: NPRS scale: 0/10 Pain location: low back maybe a little to the right Pain description: achy pain Aggravating factors: lying down Relieving factors: DKTC, moving around  PRECAUTIONS: None  RED FLAGS: None   WEIGHT BEARING RESTRICTIONS: No  FALLS:  Has patient fallen in last 6  months? Yes. Number of falls 1 walking in the dark and bumped into something in the hall and fell on her knees  LIVING ENVIRONMENT: Lives with: lives with their spouse Lives in: House/apartment  OCCUPATION: Engineer, agricultural and church organist  PLOF: Independent, Vocation/Vocational requirements: sitting, and Leisure: walks daily   PATIENT GOALS: to get better, increase strength in low back  NEXT MD VISIT: none scheduled  OBJECTIVE:  Note: Objective measures were completed at Evaluation unless otherwise noted.  DIAGNOSTIC FINDINGS:  none  PATIENT SURVEYS:  Modified Oswestry  3 / 50 = 6.0 %   COGNITION: Overall cognitive status: Within functional limits for tasks assessed     SENSATION: WFL  MUSCLE LENGTH: Tightness in B lumbar paraspinals, L>R quads, L hip flexors,  L piriformis, gastrocs   POSTURE: rounded shoulders, forward head, and mild R lumbar convexity  PALPATION: Palpation: TTP at B gluteals. Increased tissue tension in B lumbar. Spinal Mobility: R L2/3 some pain with UPA mob   LUMBAR ROM: some pain in low back with prone ext  AROM eval  Flexion Hands to mid shin limited by HS  Extension   Right lateral flexion 9 some pain in R glute  Left lateral flexion 12  Right rotation Full   Left rotation full   (Blank rows = not tested)  LOWER EXTREMITY ROM:   knee flexion limited by tight quads L > R  Active  Right eval Left eval  Hip flexion    Hip extension    Hip abduction    Hip adduction    Hip internal rotation  WNL  Hip external rotation   WNL  Knee flexion    Knee extension    Ankle dorsiflexion    Ankle plantarflexion    Ankle inversion    Ankle eversion     (Blank rows = not tested)  LOWER EXTREMITY MMT:    MMT Right eval Left eval  Hip flexion 4+ 5  Hip extension 4+ 4  Hip abduction    Hip adduction    Hip internal rotation    Hip external rotation    Knee flexion 4+ 5  Knee extension 5 5  Ankle dorsiflexion 5 5  Ankle plantarflexion    Ankle inversion    Ankle eversion     (Blank rows = not tested)  FUNCTIONAL TESTS:  03/04/24:  5x STS: 15.97, 14.94  5 times sit to stand: 16.8 sec  SLS R 19 sec L 14 sec     EC  feet together and apart x 30 sec ea   TREATMENT DATE:                                                                                                                               [facility] Adult PT Treatment:  DATE: 03/11/24  L6 x 5 min PT present to discuss status Seated fig 4 stretch 2x30" bil Child's pose 3 way x 30 sec ea Counter L stretch standing Supine sequential march to 90/90 x10  90/90 heel taps x 10 B Did a few isometric hip flexor reps on R Crunches x 10 Oblique crunch reaches x 10 B Dying bug holding bil 3lb in UE 2 x 10 90/90 LE with bil shoulder flexion holding with 3# in each hand 2 x10 (pt does not have 8# at home) Supine bridge holding 10lb dumbbell on anterior pelvis for added resistance x10 Modified depth dead lift 10lb (to mid shin) x 10 reps Stagger stance on 10lb kbell pass arounds 5x each direction, each foot placement   [facility] Adult PT Treatment:                                                DATE: 03/09/24  L6 x 2' then L5 per Pt request x3' PT present to discuss status Seated fig 4 stretch 2x30" bil Long leg distraction L 2x30 sec Lt seated ER A/ROM for strength to work on Ind with crossing Lt leg over Rt x10 Supine sequential march to 90/90 x10  Dying bug holding bil 3lb in UE 2 x 10 90/90 LE with bil shoulder  flexion holding 8lb dumbbell 2 x10 Supine bridge holding 8lb dumbbell on anterior pelvis for added resistance x10 Modified depth dead lift 10lb (to mid shin) x 10 reps Stagger stance on airex pad 10lb kbell pass arounds 5x each direction, each foot placement Red tband at ankles standing hip ext at 45 deg and hip abd 2x10 each bil  [facility] Adult PT Treatment:                                                DATE: 03/04/24  L6 x 2' then L5 per Pt request x3' PT present to discuss status Seated fig 4 stretch 2x30" bil Lt seated ER A/ROM for strength to work on Ind with crossing Lt leg over Rt x10 5x STS two trials Supine sequential march to 90/90 x10  Dying bug holding bil 3lb in UE x20 Supine SLR x10 bil 90/90 LE with bil shoulder flexion holding 8lb dumbbell x10 Supine bridge holding 8lb dumbbell on anterior pelvis for added resistance x10 Sidestepping 12 steps x 4 red loop around thighs, 6 steps down and back with black loop - no trunk swaying Modified depth dead lift 10lb (to mid shin) x 10 reps Stagger stance on airex pad 10lb kbell pass arounds 5x each direction, each foot placement Red tband at ankles standing hip ext at 45 deg and hip abd 2x10 each bil Green tband pallof press, pallof rotation small range - 10x each  PATIENT EDUCATION:  Education details: PT eval findings, anticipated POC, initial HEP, and tips to reduce falls  Person educated: Patient Education method: Explanation, Demonstration, and Handouts Education comprehension: verbalized understanding and returned demonstration  HOME EXERCISE PROGRAM: Access Code: [email or web address] URL: [email or web address] Date: 03/11/2024 Prepared by: Raynelle Fanning  Exercises - Seated Piriformis Stretch with Trunk Bend  - 2 x daily - 7 x weekly - 1 sets - 3 reps - 30-60 sec  hold -  Child's Pose Stretch  - 1 x daily - 7 x weekly - 1 sets - 3 reps - 20-30 sec hold - Child's Pose with Sidebending  - 2 x daily - 7 x weekly - 1 sets - 2 reps -  60 sec hold - Standing Lumbar Spine Flexion Stretch Counter  - 1 x daily - 3 x weekly - 2 sets - 10 reps - Hooklying Sequential Leg March and Lower  - 1 x daily - 3 x weekly - 2 sets - 10 reps - Supine 90/90 Alternating Heel Touches with Posterior Pelvic Tilt  - 1 x daily - 3 x weekly - 2 sets - 10 reps - Prone Hip Extension  - 1 x daily - 3 x weekly - 2 sets - 10 reps - Curl Up with Arms Crossed  - 1 x daily - 3 x weekly - 2 sets - 10 reps - Curl Up with Reach  - 1 x daily - 3 x weekly - 2 sets - 10 reps - Supine Dead Bug with Leg Extension  - 1 x daily - 3 x weekly - 2 sets - 10 reps - Supine Bridge  - 2 x daily - 7 x weekly - 1-3 sets - 10 reps - Supine 90/90 Shoulder Flexion with Abdominal Bracing  - 1 x daily - 3 x weekly - 2 sets - 10 reps - Standing Hip Abduction with Resistance at Ankles and Counter Support  - 1 x daily - 3 x weekly - 2 sets - 10 reps - Standing Hip Extension with Resistance at Ankles and Counter Support  - 1 x daily - 3 x weekly - 2 sets - 10 reps - Half Deadlift with Kettlebell  - 1 x daily - 3 x weekly - 2 sets - 10 reps - Semi-Tandem Stance with Dumbbell Pass  - 1 x daily - 3 x weekly - 2 sets - 10 reps  ASSESSMENT:  CLINICAL IMPRESSION:  Kimberly Ryan has met all but one of her LTGs. She still demonstrates tightness with L hip ER in sitting. This has improved, but she also admits that she has not been fully compliant with this stretch and her other exercises. Patient advised to continue to make an appointment with herself at least 2 days/week like she has for PT and continue to work on her HEP. She is pleased with her current functional level and agrees to d/c.  OBJECTIVE IMPAIRMENTS: decreased activity tolerance, decreased ROM, decreased strength, hypomobility, increased muscle spasms, impaired flexibility, postural dysfunction, and pain.   ACTIVITY LIMITATIONS: lifting, sleeping, dressing, and locomotion level  PARTICIPATION LIMITATIONS: community  activity  PERSONAL FACTORS: Age, Fitness, and 1 comorbidity: R knee pain  are also affecting patient's functional outcome.   REHAB POTENTIAL: Excellent  CLINICAL DECISION MAKING: Stable/uncomplicated  EVALUATION COMPLEXITY: Low   GOALS: Goals reviewed with patient? Yes  SHORT TERM GOALS: Target date: 02/19/2024   Patient will be independent with initial HEP.  Baseline:  Goal status: IN PROGRESS (partially compliant)  2.  Decreased pain by 25% in the morning.  Baseline:  Goal status: MET   LONG TERM GOALS: Target date: 03/11/2024   Patient will be independent with advanced/ongoing HEP to improve outcomes and carryover.  Baseline:  Goal status: MET - not fully compliant in regards to frequency  2.  Patient will be able to achieve fig 4 position without manual assistance due to improved Lt hip ROM and strength.  Baseline:  Goal status: PARTIALLY  MET able to demo 3/28 but then needs manual repositioning  3.  Patient will be able to sleep without waking from pain  Baseline:  Goal status: MET  4.  Patient will demonstrate improved LE strength as evidenced by decreased 5xSTS by 2-3 seconds. Baseline: 16.8 sec Goal status: MET 3/21  5.  Patient will report decreased pain with walking by 50% or more. Baseline:  Goal status: MET    PLAN:  PT FREQUENCY: 2x/week  PT DURATION: 6 weeks  PLANNED INTERVENTIONS: 97164- PT Re-evaluation, 97110-Therapeutic exercises, 97530- Therapeutic activity, 97112- Neuromuscular re-education, 97535- Self Care, 16109- Manual therapy, 97014- Electrical stimulation (unattended), Patient/Family education, Taping, Dry Needling, Joint mobilization, Spinal mobilization, Cryotherapy, and Moist heat.  PLAN FOR NEXT SESSION: D/C to HEP   Solon Palm, PT  03/11/24 12:04 PM  PHYSICAL THERAPY DISCHARGE SUMMARY  Visits from Start of Care: 11  Current functional level related to goals / functional outcomes: See above   Remaining deficits: See  above   Education / Equipment: HEP   Patient agrees to discharge. Patient goals were partially met. Patient is being discharged due to being pleased with the current functional level.  Solon Palm, PT 03/11/24 12:04 PM

## 2024-03-11 ENCOUNTER — Ambulatory Visit: Payer: PPO | Admitting: Physical Therapy

## 2024-03-11 ENCOUNTER — Encounter: Payer: Self-pay | Admitting: Physical Therapy

## 2024-03-11 DIAGNOSIS — R252 Cramp and spasm: Secondary | ICD-10-CM

## 2024-03-11 DIAGNOSIS — M6281 Muscle weakness (generalized): Secondary | ICD-10-CM

## 2024-03-11 DIAGNOSIS — M5459 Other low back pain: Secondary | ICD-10-CM | POA: Diagnosis not present

## 2024-03-14 DIAGNOSIS — G479 Sleep disorder, unspecified: Secondary | ICD-10-CM | POA: Diagnosis not present

## 2024-03-14 DIAGNOSIS — E039 Hypothyroidism, unspecified: Secondary | ICD-10-CM | POA: Diagnosis not present

## 2024-03-14 DIAGNOSIS — E559 Vitamin D deficiency, unspecified: Secondary | ICD-10-CM | POA: Diagnosis not present

## 2024-03-14 DIAGNOSIS — K219 Gastro-esophageal reflux disease without esophagitis: Secondary | ICD-10-CM | POA: Diagnosis not present

## 2024-03-14 DIAGNOSIS — F419 Anxiety disorder, unspecified: Secondary | ICD-10-CM | POA: Diagnosis not present

## 2024-03-14 DIAGNOSIS — R7303 Prediabetes: Secondary | ICD-10-CM | POA: Diagnosis not present

## 2024-03-14 DIAGNOSIS — Z6830 Body mass index (BMI) 30.0-30.9, adult: Secondary | ICD-10-CM | POA: Diagnosis not present

## 2024-03-29 DIAGNOSIS — M1711 Unilateral primary osteoarthritis, right knee: Secondary | ICD-10-CM | POA: Diagnosis not present

## 2024-03-29 DIAGNOSIS — M170 Bilateral primary osteoarthritis of knee: Secondary | ICD-10-CM | POA: Diagnosis not present

## 2024-04-13 DIAGNOSIS — M1712 Unilateral primary osteoarthritis, left knee: Secondary | ICD-10-CM | POA: Diagnosis not present

## 2024-05-13 DIAGNOSIS — M25561 Pain in right knee: Secondary | ICD-10-CM | POA: Diagnosis not present

## 2024-06-04 DIAGNOSIS — M1711 Unilateral primary osteoarthritis, right knee: Secondary | ICD-10-CM | POA: Diagnosis not present

## 2024-06-13 DIAGNOSIS — M170 Bilateral primary osteoarthritis of knee: Secondary | ICD-10-CM | POA: Diagnosis not present

## 2024-06-30 ENCOUNTER — Ambulatory Visit: Attending: Specialist

## 2024-06-30 ENCOUNTER — Other Ambulatory Visit: Payer: Self-pay

## 2024-06-30 DIAGNOSIS — R252 Cramp and spasm: Secondary | ICD-10-CM | POA: Insufficient documentation

## 2024-06-30 DIAGNOSIS — M25561 Pain in right knee: Secondary | ICD-10-CM | POA: Diagnosis not present

## 2024-06-30 DIAGNOSIS — G8929 Other chronic pain: Secondary | ICD-10-CM | POA: Diagnosis not present

## 2024-06-30 DIAGNOSIS — M25661 Stiffness of right knee, not elsewhere classified: Secondary | ICD-10-CM | POA: Insufficient documentation

## 2024-06-30 DIAGNOSIS — M1712 Unilateral primary osteoarthritis, left knee: Secondary | ICD-10-CM | POA: Diagnosis not present

## 2024-06-30 DIAGNOSIS — R262 Difficulty in walking, not elsewhere classified: Secondary | ICD-10-CM | POA: Diagnosis not present

## 2024-06-30 DIAGNOSIS — M6281 Muscle weakness (generalized): Secondary | ICD-10-CM | POA: Diagnosis not present

## 2024-06-30 NOTE — Therapy (Unsigned)
 OUTPATIENT PHYSICAL THERAPY LOWER EXTREMITY EVALUATION   Patient Name: Kimberly Ryan MRN: [id number] DOB:01-17-1951, 73 y.o., female Today's Date: 07/01/2024  END OF SESSION:  PT End of Session - 06/30/24 1502     Visit Number 1    Authorization Type [employer]    PT Start Time 1445    PT Stop Time 1532    PT Time Calculation (min) 47 min    Activity Tolerance Patient tolerated treatment well    Behavior During Therapy WFL for tasks assessed/performed          Past Medical History:  Diagnosis Date   Anxiety    Arthritis    GERD (gastroesophageal reflux disease)    Headache(784.0)    Shortness of breath    with exertion    Thyroid  disease    Past Surgical History:  Procedure Laterality Date   MYOMECTOMY  2000   RHINOPLASTY  1953   THYROID  LOBECTOMY  08/17/2012   Procedure: THYROID  LOBECTOMY;  Surgeon: Camellia CHRISTELLA Blush, MD,FACS;  Location: [facility];  Service: General;  Laterality: Left;  Left Thyroid  Lobectomy   TONSILLECTOMY  1954   Patient Active Problem List   Diagnosis Date Noted   OP (osteoporosis) 01/06/2023   Postsurgical hypothyroidism 11/04/2012   Multiple thyroid  nodules 12/19/2011    PCP: Domenick Loma, NP  REFERRING PROVIDER: Duwayne Purchase, MD  REFERRING DIAG: M17.11 (ICD-10-CM) - Unilateral primary osteoarthritis, right knee  THERAPY DIAG:  Chronic pain of right knee  Muscle weakness (generalized)  Stiffness of right knee, not elsewhere classified  Difficulty in walking, not elsewhere classified  Cramp and spasm  Rationale for Evaluation and Treatment: Rehabilitation  ONSET DATE: 06/13/2024  SUBJECTIVE:   SUBJECTIVE STATEMENT: Patient reports ongoing right knee pain and has confirmed OA by ortho MD.  She is also having some issues with the left knee but the right one is worse.  She knows she will be a candidate for TKA eventually but hopes to put this off as long as possible.  She reports pain with ascending and  descending stairs, standing or walking for prolonged periods of time, and walking down inclines.  Provider suggested she try formal PT for several weeks to see if building strength and correcting postural alignment would help.    PERTINENT HISTORY: na PAIN:  Are you having pain? Yes: NPRS scale: 0/10 at rest, at worst 4/10 Pain location: right knee Pain description: aching Aggravating factors: stairs, prolonged walking and standing, down inclines Relieving factors: rest, meds, ice  PRECAUTIONS: None  RED FLAGS: None   WEIGHT BEARING RESTRICTIONS: No  FALLS:  Has patient fallen in last 6 months? 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   NEXT MD VISIT: prn  OBJECTIVE:  Note: Objective measures were completed at Evaluation unless otherwise noted.  DIAGNOSTIC FINDINGS: xrays  PATIENT SURVEYS:  LEFS  Extreme difficulty/unable (0), Quite a bit of difficulty (1), Moderate difficulty (2), Little difficulty (3), No  difficulty (4) Survey date:    Any of your usual work, housework or school activities 3  2. Usual hobbies, recreational or sporting activities 3  3. Getting into/out of the bath 3  4. Walking between rooms 4  5. Putting on socks/shoes 4  6. Squatting  3  7. Lifting an object, like a bag of groceries from the floor 3  8. Performing light activities around your home  4  9. Performing heavy activities around your home 2  10. Getting into/out of a car 3  11. Walking 2 blocks 4  12. Walking 1 mile 4  13. Going up/down 10 stairs (1 flight) 1  14. Standing for 1 hour 2  15.  sitting for 1 hour 4  16. Running on even ground 0  17. Running on uneven ground 0  18. Making sharp turns while running fast 0  19. Hopping  0  20. Rolling over in bed 4  Score total:  51/80     COGNITION: Overall cognitive status: Within functional limits for tasks assessed     SENSATION: WFL  POSTURE: Right knee valgus  PALPATION: Crepitus bilaterally (R > L) on seated knee flex/ext  LOWER EXTREMITY ROM:  All WFL with exception of right knee extension which is   LOWER EXTREMITY MMT:  MMT Right eval Left eval  Hip flexion    Hip extension    Hip abduction    Hip adduction    Hip internal rotation    Hip external rotation    Knee flexion    Knee extension    Ankle dorsiflexion    Ankle plantarflexion    Ankle inversion    Ankle eversion     (Blank rows = not tested)  LOWER EXTREMITY SPECIAL TESTS:  Knee special tests: Patellafemoral grind test: positive   FUNCTIONAL TESTS:  5 times sit to stand: 16.06 sec Timed up and go (TUG): 7.84 sec  GAIT: Distance walked: 30 feet Assistive device utilized: None Level of assistance: Complete Independence Comments: antalgic                                                                                                                                TREATMENT DATE: 07/01/24 Initial evaluation completed and initiated HEP    PATIENT EDUCATION:  Education details: Initiated HEP, educated on anatomy of the knee, valgus position of knee, importance of hip strength and proper alignment to avoid further deformity.  Person educated: Patient Education method: Explanation, Demonstration, Verbal cues, and Handouts Education comprehension: verbalized understanding, returned demonstration, and verbal cues  required  HOME EXERCISE PROGRAM: Access Code: [email or web address] URL: [email or web address] Date: 06/30/2024 Prepared by: Delon Haddock  Exercises - Supine Quadricep Sets  - 1 x daily - 7 x weekly - 1 sets - 20 reps - Supine Knee Extension Strengthening  - 1 x daily - 7 x weekly - 1 sets - 20 reps - Small Range Straight Leg Raise  -  1 x daily - 7 x weekly - 2 sets - 10 reps - Seated Long Arc Quad  - 1 x daily - 7 x weekly - 1 sets - 20 reps - Seated Knee Extension Stretch with Chair  - 1 x daily - 7 x weekly - 1 sets - 1 reps - work up to 30 min hold  ASSESSMENT:  CLINICAL IMPRESSION: Patient is a 73 y.o. female who was seen today for physical therapy evaluation and treatment for right knee > left knee osteoarthritis.  She presents with right knee valgus deformity, quad weakness, knee instability and decreased overall function.  She would benefit from skilled PT for quad rehab, hip strengthening, gait training, functional strengthening, and postural education.    OBJECTIVE IMPAIRMENTS: Abnormal gait, decreased activity tolerance, decreased balance, decreased mobility, difficulty walking, decreased ROM, decreased strength, increased edema, increased fascial restrictions, increased muscle spasms, impaired flexibility, improper body mechanics, postural dysfunction, obesity, and pain.   ACTIVITY LIMITATIONS: carrying, lifting, bending, sitting, standing, squatting, sleeping, stairs, transfers, bed mobility, bathing, toileting, dressing, and caring for others  PARTICIPATION LIMITATIONS: meal prep, cleaning, laundry, driving, shopping, community activity, occupation, yard work, and church  PERSONAL FACTORS: Fitness, Past/current experiences, Profession, and 1-2 comorbidities: osteoporosis and anxiety are also affecting patient's functional outcome.   REHAB POTENTIAL: Good  CLINICAL DECISION MAKING: Stable/uncomplicated  EVALUATION COMPLEXITY: Low   GOALS: Goals reviewed with  patient? Yes  SHORT TERM GOALS: Target date: 07/28/2024   Patient will be independent with initial HEP  Baseline: Goal status: INITIAL  2.  Pain report to be no greater than 4/10  Baseline:  Goal status: INITIAL   LONG TERM GOALS: Target date: 08/25/2024   Patient to be independent with advanced HEP  Baseline:  Goal status: INITIAL  2.  Patient to report pain no greater than 2/10  Baseline:  Goal status: INITIAL  3.  Functional scores to improve by 2-3 seconds Baseline:  Goal status: INITIAL  4.  LEFS score to improve to 55 Baseline:  Goal status: INITIAL  5.  Patient to report 85% improvement in overall symptoms  Baseline:  Goal status: INITIAL  6.  Patient to be able to ascend and descend steps without pain or no greater than 2/10  Baseline:  Goal status: INITIAL   PLAN:  PT FREQUENCY: 1-2x/week  PT DURATION: 8 weeks  PLANNED INTERVENTIONS: 97110-Therapeutic exercises, 97530- Therapeutic activity, 97112- Neuromuscular re-education, 97535- Self Care, 02859- Manual therapy, [phone]- Gait training, [phone]- Aquatic Therapy, [phone]- Electrical stimulation (unattended), [phone]- Electrical stimulation (manual), 97016- Vasopneumatic device, Patient/Family education, Balance training, Stair training, Taping, Joint mobilization, Spinal mobilization, Compression bandaging, DME instructions, Cryotherapy, and Moist heat  PLAN FOR NEXT SESSION: Nustep, review HEP, quad rehab, hip strengthening   Eliese Kerwood B. Keirstan Iannello, PT 07/01/24 12:25 PM [facility] [address] [place], [place] [address] Phone # [phone] Fax [phone]

## 2024-07-07 DIAGNOSIS — M1712 Unilateral primary osteoarthritis, left knee: Secondary | ICD-10-CM | POA: Diagnosis not present

## 2024-07-07 DIAGNOSIS — M25562 Pain in left knee: Secondary | ICD-10-CM | POA: Diagnosis not present

## 2024-07-13 DIAGNOSIS — H40023 Open angle with borderline findings, high risk, bilateral: Secondary | ICD-10-CM | POA: Diagnosis not present

## 2024-07-14 DIAGNOSIS — M1712 Unilateral primary osteoarthritis, left knee: Secondary | ICD-10-CM | POA: Diagnosis not present

## 2024-07-15 ENCOUNTER — Ambulatory Visit: Attending: Specialist | Admitting: Physical Therapy

## 2024-07-15 ENCOUNTER — Encounter: Payer: Self-pay | Admitting: Physical Therapy

## 2024-07-15 DIAGNOSIS — R293 Abnormal posture: Secondary | ICD-10-CM | POA: Insufficient documentation

## 2024-07-15 DIAGNOSIS — G8929 Other chronic pain: Secondary | ICD-10-CM | POA: Insufficient documentation

## 2024-07-15 DIAGNOSIS — R262 Difficulty in walking, not elsewhere classified: Secondary | ICD-10-CM | POA: Insufficient documentation

## 2024-07-15 DIAGNOSIS — M25561 Pain in right knee: Secondary | ICD-10-CM | POA: Insufficient documentation

## 2024-07-15 DIAGNOSIS — M79644 Pain in right finger(s): Secondary | ICD-10-CM | POA: Diagnosis not present

## 2024-07-15 DIAGNOSIS — M25531 Pain in right wrist: Secondary | ICD-10-CM | POA: Diagnosis not present

## 2024-07-15 DIAGNOSIS — M25661 Stiffness of right knee, not elsewhere classified: Secondary | ICD-10-CM | POA: Insufficient documentation

## 2024-07-15 DIAGNOSIS — M6281 Muscle weakness (generalized): Secondary | ICD-10-CM | POA: Diagnosis not present

## 2024-07-15 DIAGNOSIS — R252 Cramp and spasm: Secondary | ICD-10-CM | POA: Diagnosis not present

## 2024-07-15 DIAGNOSIS — Z1231 Encounter for screening mammogram for malignant neoplasm of breast: Secondary | ICD-10-CM | POA: Diagnosis not present

## 2024-07-15 NOTE — Therapy (Signed)
 OUTPATIENT PHYSICAL THERAPY LOWER EXTREMITY TREATMENT   Patient Name: Kimberly Ryan MRN: [id number] DOB:[birth year], 73 y.o., female Today's Date: 07/15/2024  END OF SESSION:  PT End of Session - 07/15/24 1025     Visit Number 2    Authorization Type [employer]    PT Start Time 1019    PT Stop Time 1100    PT Time Calculation (min) 41 min    Activity Tolerance Patient tolerated treatment well    Behavior During Therapy WFL for tasks assessed/performed           Past Medical History:  Diagnosis Date   Anxiety    Arthritis    GERD (gastroesophageal reflux disease)    Headache(784.0)    Shortness of breath    with exertion    Thyroid  disease    Past Surgical History:  Procedure Laterality Date   MYOMECTOMY  2000   RHINOPLASTY  1953   THYROID  LOBECTOMY  08/17/2012   Procedure: THYROID  LOBECTOMY;  Surgeon: Camellia CHRISTELLA Blush, MD,FACS;  Location: [facility];  Service: General;  Laterality: Left;  Left Thyroid  Lobectomy   TONSILLECTOMY  1954   Patient Active Problem List   Diagnosis Date Noted   OP (osteoporosis) 01/06/2023   Postsurgical hypothyroidism 11/04/2012   Multiple thyroid  nodules 12/19/2011    PCP: Domenick Loma, NP  REFERRING PROVIDER: Duwayne Purchase, MD  REFERRING DIAG: M17.11 (ICD-10-CM) - Unilateral primary osteoarthritis, right knee  THERAPY DIAG:  Chronic pain of right knee  Muscle weakness (generalized)  Stiffness of right knee, not elsewhere classified  Difficulty in walking, not elsewhere classified  Rationale for Evaluation and Treatment: Rehabilitation  ONSET DATE: 06/13/2024  SUBJECTIVE:   SUBJECTIVE STATEMENT: I have been away so I have only been doing some of the exercises.  I got a visco shot in Lt knee yesterday.    PERTINENT HISTORY: na PAIN:  Are you having pain? Yes: NPRS scale: 0/10 at rest, at worst 4/10 Pain location: right knee Pain description: aching Aggravating factors: stairs, prolonged walking and  standing, down inclines Relieving factors: rest, meds, ice  PRECAUTIONS: None  RED FLAGS: None   WEIGHT BEARING RESTRICTIONS: No  FALLS:  Has patient fallen in last 6 months? 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   NEXT MD VISIT: prn  OBJECTIVE:  Note: Objective measures were completed at Evaluation unless otherwise noted.  DIAGNOSTIC FINDINGS: xrays  PATIENT SURVEYS:  LEFS  Extreme difficulty/unable (0), Quite a bit of difficulty (1), Moderate difficulty (2), Little difficulty (3), No difficulty (4) Survey date:    Any of your usual work, housework or school activities 3  2. Usual hobbies, recreational or sporting activities 3  3. Getting into/out of the bath 3  4. Walking between rooms 4  5. Putting on socks/shoes 4  6. Squatting  3  7. Lifting an object, like a bag of groceries from the floor 3  8. Performing light activities around your  home 4  9. Performing heavy activities around your home 2  10. Getting into/out of a car 3  11. Walking 2 blocks 4  12. Walking 1 mile 4  13. Going up/down 10 stairs (1 flight) 1  14. Standing for 1 hour 2  15.  sitting for 1 hour 4  16. Running on even ground 0  17. Running on uneven ground 0  18. Making sharp turns while running fast 0  19. Hopping  0  20. Rolling over in bed 4  Score total:  51/80      COGNITION: Overall cognitive status: Within functional limits for tasks assessed     SENSATION: WFL  POSTURE: Right knee valgus  PALPATION: Crepitus bilaterally (R > L) on seated knee flex/ext  LOWER EXTREMITY ROM:  All WFL with exception of right knee extension which is   LOWER EXTREMITY MMT:  MMT Right eval Left eval  Hip flexion    Hip extension    Hip abduction    Hip adduction    Hip internal rotation    Hip external rotation    Knee flexion    Knee extension    Ankle dorsiflexion    Ankle plantarflexion    Ankle inversion    Ankle eversion     (Blank rows = not tested)  LOWER EXTREMITY SPECIAL TESTS:  Knee special tests: Patellafemoral grind test: positive   FUNCTIONAL TESTS:  5 times sit to stand: 16.06 sec Timed up and go (TUG): 7.84 sec  GAIT: Distance walked: 30 feet Assistive device utilized: None Level of assistance: Complete Independence Comments: antalgic                                                                                                                                TREATMENT DATE:  07/15/24 NuStep L1 x1' Supine quad set 10x5 bil LE SAQ Rt 10x5 SAQ to SLR Rt x10 Bridge with read loop hip abd band x10 hold 5 SL hip abd bil x10 SL clam red loop tied LAQ x10 hold 5 bil Sit to stand hold 5lb with chest press x5 (had some posterior knee pain) Step ups Rt LE 4 x10 Step down heel taps Lt LE Rt foot on 2 step  07/01/24 Initial evaluation completed and initiated HEP    PATIENT EDUCATION:  Education details: Initiated HEP, educated on anatomy of the knee, valgus position of knee, importance of hip strength and proper alignment to avoid further deformity.  Person educated: Patient Education method: Explanation, Demonstration, Verbal cues, and Handouts Education comprehension: verbalized understanding, returned demonstration, and verbal cues required  HOME EXERCISE PROGRAM: Access Code: [email or web address] URL:  [email or web address] Date: 07/15/2024 Prepared by: Orvil Patsye Sullivant  Exercises - Supine Quadricep Sets  - 1 x daily - 7 x weekly - 1 sets - 20 reps - Supine Knee Extension Strengthening  - 1 x daily - 7 x weekly - 1 sets - 20 reps - Seated  Long Arc Quad  - 1 x daily - 7 x weekly - 2 sets - 10 reps - 5 hold - Seated Knee Extension Stretch with Chair  - 1 x daily - 7 x weekly - 1 sets - 1 reps - work up to 30 min hold - Bridge with Hip Abduction and Resistance  - 1 x daily - 7 x weekly - 2 sets - 10 reps - Supine Active Straight Leg Raise  - 1 x daily - 7 x weekly - 2 sets - 10 reps - Sidelying Hip Abduction  - 1 x daily - 7 x weekly - 2 sets - 10 reps - Clamshell with Resistance  - 1 x daily - 7 x weekly - 2 sets - 10 reps  ASSESSMENT:  CLINICAL IMPRESSION: Today's session focused on review and progression of HEP.  Pt tolerated all therex very well with min posterior knee pain with loaded sit to stands.  We started working on step ups/downs as she is doing stairs at home with step to pattern and protecting the Rt knee.  She has good carry over knowledge from previous PT sessions about core stability and was able to progress LE therex with good stabilization of trunk today.    OBJECTIVE IMPAIRMENTS: Abnormal gait, decreased activity tolerance, decreased balance, decreased mobility, difficulty walking, decreased ROM, decreased strength, increased edema, increased fascial restrictions, increased muscle spasms, impaired flexibility, improper body mechanics, postural dysfunction, obesity, and pain.   ACTIVITY LIMITATIONS: carrying, lifting, bending, sitting, standing, squatting, sleeping, stairs, transfers, bed mobility, bathing, toileting, dressing, and caring for others  PARTICIPATION LIMITATIONS: meal prep, cleaning, laundry, driving, shopping, community activity, occupation, yard work, and church  PERSONAL FACTORS: Fitness, Past/current experiences, Profession, and 1-2  comorbidities: osteoporosis and anxiety are also affecting patient's functional outcome.   REHAB POTENTIAL: Good  CLINICAL DECISION MAKING: Stable/uncomplicated  EVALUATION COMPLEXITY: Low   GOALS: Goals reviewed with patient? Yes  SHORT TERM GOALS: Target date: 07/28/2024   Patient will be independent with initial HEP  Baseline: Goal status: ONGOING 8/1  2.  Pain report to be no greater than 4/10  Baseline:  Goal status: INITIAL   LONG TERM GOALS: Target date: 08/25/2024   Patient to be independent with advanced HEP  Baseline:  Goal status: INITIAL  2.  Patient to report pain no greater than 2/10  Baseline:  Goal status: INITIAL  3.  Functional scores to improve by 2-3 seconds Baseline:  Goal status: INITIAL  4.  LEFS score to improve to 55 Baseline:  Goal status: INITIAL  5.  Patient to report 85% improvement in overall symptoms  Baseline:  Goal status: INITIAL  6.  Patient to be able to ascend and descend steps without pain or no greater than 2/10  Baseline:  Goal status: INITIAL   PLAN:  PT FREQUENCY: 1-2x/week  PT DURATION: 8 weeks  PLANNED INTERVENTIONS: 97110-Therapeutic exercises, 97530- Therapeutic activity, 97112- Neuromuscular re-education, [phone]- Self Care, 02859- Manual therapy, [phone]- Gait training, [phone]- Aquatic Therapy, [phone]- Electrical stimulation (unattended), [phone]- Electrical stimulation (manual), 97016- Vasopneumatic device, Patient/Family education, Balance training, Stair training, Taping, Joint mobilization, Spinal mobilization, Compression bandaging, DME instructions, Cryotherapy, and Moist heat  PLAN FOR NEXT SESSION: Nustep, review HEP, quad rehab, hip strengthening   Orvil Fester, PT 07/15/24 12:09 PM  [facility] [address], [address] [place], [place] [address] Phone # [phone] Fax [phone]

## 2024-07-19 ENCOUNTER — Ambulatory Visit: Admitting: Physical Therapy

## 2024-07-19 ENCOUNTER — Encounter: Payer: Self-pay | Admitting: Physical Therapy

## 2024-07-19 DIAGNOSIS — R262 Difficulty in walking, not elsewhere classified: Secondary | ICD-10-CM

## 2024-07-19 DIAGNOSIS — M25561 Pain in right knee: Secondary | ICD-10-CM | POA: Diagnosis not present

## 2024-07-19 DIAGNOSIS — M25661 Stiffness of right knee, not elsewhere classified: Secondary | ICD-10-CM

## 2024-07-19 DIAGNOSIS — G8929 Other chronic pain: Secondary | ICD-10-CM

## 2024-07-19 DIAGNOSIS — M6281 Muscle weakness (generalized): Secondary | ICD-10-CM

## 2024-07-19 NOTE — Therapy (Signed)
 OUTPATIENT PHYSICAL THERAPY LOWER EXTREMITY TREATMENT   Patient Name: Kimberly Ryan MRN: [id number] DOB:Mar 01, 1951, 73 y.o., female Today's Date: 07/19/2024  END OF SESSION:  PT End of Session - 07/19/24 0939     Visit Number 3    Authorization Type [employer]    PT Start Time 0934    PT Stop Time 1016    PT Time Calculation (min) 42 min    Activity Tolerance Patient tolerated treatment well    Behavior During Therapy WFL for tasks assessed/performed            Past Medical History:  Diagnosis Date   Anxiety    Arthritis    GERD (gastroesophageal reflux disease)    Headache(784.0)    Shortness of breath    with exertion    Thyroid  disease    Past Surgical History:  Procedure Laterality Date   MYOMECTOMY  2000   RHINOPLASTY  1953   THYROID  LOBECTOMY  08/17/2012   Procedure: THYROID  LOBECTOMY;  Surgeon: Camellia CHRISTELLA Blush, MD,FACS;  Location: [facility];  Service: General;  Laterality: Left;  Left Thyroid  Lobectomy   TONSILLECTOMY  1954   Patient Active Problem List   Diagnosis Date Noted   OP (osteoporosis) 01/06/2023   Postsurgical hypothyroidism 11/04/2012   Multiple thyroid  nodules 12/19/2011    PCP: Domenick Loma, NP  REFERRING PROVIDER: Duwayne Purchase, MD  REFERRING DIAG: M17.11 (ICD-10-CM) - Unilateral primary osteoarthritis, right knee  THERAPY DIAG:  Chronic pain of right knee  Muscle weakness (generalized)  Stiffness of right knee, not elsewhere classified  Difficulty in walking, not elsewhere classified  Rationale for Evaluation and Treatment: Rehabilitation  ONSET DATE: 06/13/2024  SUBJECTIVE:   SUBJECTIVE STATEMENT: I have had on and off knee pain in each knee.  No higher than a 4/10 though.  PERTINENT HISTORY: na PAIN:  Are you having pain? Yes: NPRS scale: 0/10 at rest, at worst 4/10 Pain location: right knee Pain description: aching Aggravating factors: stairs, prolonged walking and standing, down inclines Relieving  factors: rest, meds, ice  PRECAUTIONS: None  RED FLAGS: None   WEIGHT BEARING RESTRICTIONS: No  FALLS:  Has patient fallen in last 6 months? 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   NEXT MD VISIT: prn  OBJECTIVE:  Note: Objective measures were completed at Evaluation unless otherwise noted.  DIAGNOSTIC FINDINGS: xrays  PATIENT SURVEYS:  LEFS  Extreme difficulty/unable (0), Quite a bit of difficulty (1), Moderate difficulty (2), Little difficulty (3), No difficulty (4) Survey date:    Any of your usual work, housework or school activities 3  2. Usual hobbies, recreational or sporting activities 3  3. Getting into/out of the bath 3  4. Walking between rooms 4  5. Putting on socks/shoes 4  6. Squatting  3  7. Lifting an object, like a bag of groceries from the floor 3  8. Performing light activities around your home 4  9. Performing heavy activities  around your home 2  10. Getting into/out of a car 3  11. Walking 2 blocks 4  12. Walking 1 mile 4  13. Going up/down 10 stairs (1 flight) 1  14. Standing for 1 hour 2  15.  sitting for 1 hour 4  16. Running on even ground 0  17. Running on uneven ground 0  18. Making sharp turns while running fast 0  19. Hopping  0  20. Rolling over in bed 4  Score total:  51/80     COGNITION: Overall cognitive status: Within  functional limits for tasks assessed     SENSATION: WFL  POSTURE: Right knee valgus  PALPATION: Crepitus bilaterally (R > L) on seated knee flex/ext  LOWER EXTREMITY ROM:  All WFL with exception of right knee extension which is   LOWER EXTREMITY MMT:  MMT Right eval Left eval  Hip flexion    Hip extension    Hip abduction    Hip adduction    Hip internal rotation    Hip external rotation    Knee flexion    Knee extension    Ankle dorsiflexion    Ankle plantarflexion    Ankle inversion    Ankle eversion     (Blank rows = not tested)  LOWER EXTREMITY SPECIAL TESTS:  Knee special tests: Patellafemoral grind test: positive   FUNCTIONAL TESTS:  5 times sit to stand: 16.06 sec Timed up and go (TUG): 7.84 sec  GAIT: Distance walked: 30 feet Assistive device utilized: None Level of assistance: Complete Independence Comments: antalgic                                                                                                                                TREATMENT DATE:  07/19/24 NuStep L1 x 2' Supine SAQ 1lb x10 bil, then SLR 1lb x 8 bil (fatigue) SL hip abd 1lb x10 bil SL clam and reverse clam blue loop x10 bil Prone knee flexion 2lb 2x10 Prone hip ext pressing pelvis into mat table x10 each LE Bridge with read loop hip abd band x10 hold 5 Leg press 80lb 2x15   07/15/24 NuStep L1 x1' Supine quad set 10x5 bil LE SAQ Rt 10x5 SAQ to SLR Rt x10 Bridge with read loop hip abd band x10 hold 5 SL hip abd bil x10 SL clam red loop tied LAQ x10 hold 5 bil Sit to stand hold 5lb with chest press x5 (had some posterior knee pain) Step ups Rt LE 4 x10 Step down heel taps on 2 step 5x bil and 4 step 5x bil (can't get Lt heel to floor from 4) Step up 6 step march to 3rd step x5 each LE (Rt knee some pain last rep)  07/01/24 Initial evaluation completed and initiated HEP    PATIENT EDUCATION:  Education details: Initiated HEP, educated on anatomy of the  knee, valgus position of knee, importance of hip strength and proper  alignment to avoid further deformity.  Person educated: Patient Education method: Explanation, Demonstration, Verbal cues, and Handouts Education comprehension: verbalized understanding, returned demonstration, and verbal cues required  HOME EXERCISE PROGRAM: Access Code: [email or web address] URL: [email or web address] Date: 07/15/2024 Prepared by: Orvil Darris Carachure  Exercises - Supine Quadricep Sets  - 1 x daily - 7 x weekly - 1 sets - 20 reps - Supine Knee Extension Strengthening  - 1 x daily - 7 x weekly - 1 sets - 20 reps - Seated Long Arc Quad  - 1 x daily - 7 x weekly - 2 sets - 10 reps - 5 hold - Seated Knee Extension Stretch with Chair  - 1 x daily - 7 x weekly - 1 sets - 1 reps - work up to 30 min hold - Bridge with Hip Abduction and Resistance  - 1 x daily - 7 x weekly - 2 sets - 10 reps - Supine Active Straight Leg Raise  - 1 x daily - 7 x weekly - 2 sets - 10 reps - Sidelying Hip Abduction  - 1 x daily - 7 x weekly - 2 sets - 10 reps - Clamshell with Resistance  - 1 x daily - 7 x weekly - 2 sets - 10 reps  ASSESSMENT:  CLINICAL IMPRESSION: Pt continues to have intermittent bil knee pain up to 4/10.  She uses excellent technique with stabilizing trunk during LE strength training.  She was able to tolerate addition of LE ankle weights for mat therex today.  Aggravating factors include STS and stairs up/down so keeping reps low with these.    OBJECTIVE IMPAIRMENTS: Abnormal gait, decreased activity tolerance, decreased balance, decreased mobility, difficulty walking, decreased ROM, decreased strength, increased edema, increased fascial restrictions, increased muscle spasms, impaired flexibility, improper body mechanics, postural dysfunction, obesity, and pain.   ACTIVITY LIMITATIONS: carrying, lifting, bending, sitting, standing, squatting, sleeping, stairs, transfers, bed mobility, bathing, toileting,  dressing, and caring for others  PARTICIPATION LIMITATIONS: meal prep, cleaning, laundry, driving, shopping, community activity, occupation, yard work, and church  PERSONAL FACTORS: Fitness, Past/current experiences, Profession, and 1-2 comorbidities: osteoporosis and anxiety are also affecting patient's functional outcome.   REHAB POTENTIAL: Good  CLINICAL DECISION MAKING: Stable/uncomplicated  EVALUATION COMPLEXITY: Low   GOALS: Goals reviewed with patient? Yes  SHORT TERM GOALS: Target date: 07/28/2024   Patient will be independent with initial HEP  Baseline: Goal status: ONGOING 8/1  2.  Pain report to be no greater than 4/10  Baseline:  Goal status: INITIAL   LONG TERM GOALS: Target date: 08/25/2024   Patient to be independent with advanced HEP  Baseline:  Goal status: INITIAL  2.  Patient to report pain no greater than 2/10  Baseline:  Goal status: INITIAL  3.  Functional scores to improve by 2-3 seconds Baseline:  Goal status: INITIAL  4.  LEFS score to improve to 55 Baseline:  Goal status: INITIAL  5.  Patient to report 85% improvement in overall symptoms  Baseline:  Goal status: INITIAL  6.  Patient to be able to ascend and descend steps without pain or no greater than 2/10  Baseline:  Goal status: INITIAL   PLAN:  PT FREQUENCY: 1-2x/week  PT DURATION: 8 weeks  PLANNED INTERVENTIONS: 97110-Therapeutic exercises, 97530- Therapeutic activity, 97112- Neuromuscular re-education, 97535- Self Care, 02859- Manual therapy, [phone]- Gait training, [phone]- Aquatic Therapy, [phone]- Electrical stimulation (unattended), [phone]- Electrical stimulation (manual), 97016- Vasopneumatic device, Patient/Family education, Balance training, Stair training, Taping, Joint mobilization, Spinal mobilization, Compression bandaging,  DME instructions, Cryotherapy, and Moist heat  PLAN FOR NEXT SESSION: Nustep, review HEP, quad rehab, hip strengthening   Orvil Fester,  PT 07/19/24 10:17 AM   [facility] [address] [place], [place] [address] Phone # [phone] Fax [phone]

## 2024-07-20 DIAGNOSIS — R7303 Prediabetes: Secondary | ICD-10-CM | POA: Diagnosis not present

## 2024-07-20 DIAGNOSIS — Z0000 Encounter for general adult medical examination without abnormal findings: Secondary | ICD-10-CM | POA: Diagnosis not present

## 2024-07-20 DIAGNOSIS — M810 Age-related osteoporosis without current pathological fracture: Secondary | ICD-10-CM | POA: Diagnosis not present

## 2024-07-20 DIAGNOSIS — G479 Sleep disorder, unspecified: Secondary | ICD-10-CM | POA: Diagnosis not present

## 2024-07-20 DIAGNOSIS — E7800 Pure hypercholesterolemia, unspecified: Secondary | ICD-10-CM | POA: Diagnosis not present

## 2024-07-26 ENCOUNTER — Ambulatory Visit: Admitting: Physical Therapy

## 2024-07-26 ENCOUNTER — Ambulatory Visit

## 2024-07-26 DIAGNOSIS — G8929 Other chronic pain: Secondary | ICD-10-CM

## 2024-07-26 DIAGNOSIS — M25561 Pain in right knee: Secondary | ICD-10-CM | POA: Diagnosis not present

## 2024-07-26 DIAGNOSIS — M6281 Muscle weakness (generalized): Secondary | ICD-10-CM

## 2024-07-26 DIAGNOSIS — M25661 Stiffness of right knee, not elsewhere classified: Secondary | ICD-10-CM

## 2024-07-26 NOTE — Therapy (Signed)
 OUTPATIENT PHYSICAL THERAPY LOWER EXTREMITY TREATMENT   Patient Name: Kimberly Ryan MRN: [id number] DOB:14-Apr-1951, 73 y.o., female Today's Date: 07/26/2024  END OF SESSION:  PT End of Session - 07/26/24 1129     Visit Number 4    Authorization Type [employer]    PT Start Time 1100    PT Stop Time 1145    PT Time Calculation (min) 45 min    Activity Tolerance Patient tolerated treatment well    Behavior During Therapy WFL for tasks assessed/performed             Past Medical History:  Diagnosis Date   Anxiety    Arthritis    GERD (gastroesophageal reflux disease)    Headache(784.0)    Shortness of breath    with exertion    Thyroid  disease    Past Surgical History:  Procedure Laterality Date   MYOMECTOMY  2000   RHINOPLASTY  1953   THYROID  LOBECTOMY  08/17/2012   Procedure: THYROID  LOBECTOMY;  Surgeon: Camellia CHRISTELLA Blush, MD,FACS;  Location: [facility];  Service: General;  Laterality: Left;  Left Thyroid  Lobectomy   TONSILLECTOMY  1954   Patient Active Problem List   Diagnosis Date Noted   OP (osteoporosis) 01/06/2023   Postsurgical hypothyroidism 11/04/2012   Multiple thyroid  nodules 12/19/2011    PCP: Domenick Loma, NP  REFERRING PROVIDER: Duwayne Purchase, MD  REFERRING DIAG: M17.11 (ICD-10-CM) - Unilateral primary osteoarthritis, right knee  THERAPY DIAG:  Chronic pain of right knee  Muscle weakness (generalized)  Stiffness of right knee, not elsewhere classified  Rationale for Evaluation and Treatment: Rehabilitation  ONSET DATE: 06/13/2024  SUBJECTIVE:   SUBJECTIVE STATEMENT: Right now, 0/10 pain in right knee. Both knees bother her when going down the stairs, 5/10 when this is occurring.   PERTINENT HISTORY: na PAIN:  Are you having pain? Yes: NPRS scale: 0/10 at rest, at worst 4/10 Pain location: right knee Pain description: aching Aggravating factors: stairs, prolonged walking and standing, down inclines Relieving factors:  rest, meds, ice  PRECAUTIONS: None  RED FLAGS: None   WEIGHT BEARING RESTRICTIONS: No  FALLS:  Has patient fallen in last 6 months? 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   NEXT MD VISIT: prn  OBJECTIVE:  Note: Objective measures were completed at Evaluation unless otherwise noted.  DIAGNOSTIC FINDINGS: xrays  PATIENT SURVEYS:  LEFS  Extreme difficulty/unable (0), Quite a bit of difficulty (1), Moderate difficulty (2), Little difficulty (3), No difficulty (4) Survey date:    Any of your usual work, housework or school activities 3  2. Usual hobbies, recreational or sporting activities 3  3. Getting into/out of the bath 3  4. Walking between rooms 4  5. Putting on socks/shoes 4  6. Squatting  3  7. Lifting an object, like a bag of groceries from the floor 3  8. Performing light activities around your home 4  9. Performing heavy activities around your  home 2  10. Getting into/out of a car 3  11. Walking 2 blocks 4  12. Walking 1 mile 4  13. Going up/down 10 stairs (1 flight) 1  14. Standing for 1 hour 2  15.  sitting for 1 hour 4  16. Running on even ground 0  17. Running on uneven ground 0  18. Making sharp turns while running fast 0  19. Hopping  0  20. Rolling over in bed 4  Score total:  51/80     COGNITION: Overall cognitive status: Within functional  limits for tasks assessed     SENSATION: WFL  POSTURE: Right knee valgus  PALPATION: Crepitus bilaterally (R > L) on seated knee flex/ext  LOWER EXTREMITY ROM:  All WFL with exception of right knee extension which is   LOWER EXTREMITY MMT:  MMT Right eval Left eval  Hip flexion    Hip extension    Hip abduction    Hip adduction    Hip internal rotation    Hip external rotation    Knee flexion    Knee extension    Ankle dorsiflexion    Ankle plantarflexion    Ankle inversion    Ankle eversion     (Blank rows = not tested)  LOWER EXTREMITY SPECIAL TESTS:  Knee special tests: Patellafemoral grind test: positive   FUNCTIONAL TESTS:  5 times sit to stand: 16.06 sec Timed up and go (TUG): 7.84 sec  GAIT: Distance walked: 30 feet Assistive device utilized: None Level of assistance: Complete Independence Comments: antalgic                                                                                                                              TREATMENT DATE:  07/26/24 NuStep L1 x 6' Supine SAQ 1.5lb 2x10 bil, then SLR 1lb 2x10 bil  Seated LAQ 1.5lb x10 bil SL hip abd 1.5lb x10 bil SL clam and reverse clam yellow loop (medium resistance) x10 bil Prone knee flexion 3lb 2x10 Prone hip ext pressing pelvis into mat table x10 each LE Bridge with red loop hip abd band x10 hold 5  07/19/24 NuStep L1 x 2' Supine SAQ 1lb x10 bil, then SLR 1lb x 8 bil (fatigue) SL hip abd 1lb x10 bil SL clam and reverse clam blue loop x10 bil Prone knee flexion 2lb 2x10 Prone hip ext pressing pelvis into mat table x10 each LE Bridge with read loop hip abd band x10 hold 5 Leg press 80lb 2x15  07/15/24 NuStep L1 x1' Supine quad set 10x5 bil LE SAQ Rt 10x5 SAQ to SLR Rt x10 Bridge with read loop hip abd band x10 hold 5 SL hip abd bil x10 SL clam red loop tied LAQ x10 hold 5 bil Sit to stand hold 5lb with chest press x5 (had some posterior knee pain) Step ups Rt LE 4  x10 Step down heel taps on 2 step 5x bil and 4 step 5x  bil (can't get Lt heel to floor from 4) Step up 6 step march to 3rd step x5 each LE (Rt knee some pain last rep)  PATIENT EDUCATION:  Education details: Initiated HEP, educated on anatomy of the knee, valgus position of knee, importance of hip strength and proper alignment to avoid further deformity.  Person educated: Patient Education method: Explanation, Demonstration, Verbal cues, and Handouts Education comprehension: verbalized understanding, returned demonstration, and verbal cues required  HOME EXERCISE PROGRAM: Access Code: [email or web address] URL: [email or web address] Date: 07/15/2024 Prepared by: Orvil Beuhring  Exercises - Supine Quadricep Sets  - 1 x daily - 7 x weekly - 1 sets - 20 reps - Supine Knee Extension Strengthening  - 1 x daily - 7 x weekly - 1 sets - 20 reps - Seated Long Arc Quad  - 1 x daily - 7 x weekly - 2 sets - 10 reps - 5 hold - Seated Knee Extension Stretch with Chair  - 1 x daily - 7 x weekly - 1 sets - 1 reps - work up to 30 min hold - Bridge with Hip Abduction and Resistance  - 1 x daily - 7 x weekly - 2 sets - 10 reps - Supine Active Straight Leg Raise  - 1 x daily - 7 x weekly - 2 sets - 10 reps - Sidelying Hip Abduction  - 1 x daily - 7 x weekly - 2 sets - 10 reps - Clamshell with Resistance  - 1 x daily - 7 x weekly - 2 sets - 10 reps  ASSESSMENT:  CLINICAL IMPRESSION: Pt continues to have intermittent bil knee pain up to 5/10 when descending stairs, but is pleased to say she has 0/10 pain at rest upon arrival. She demonstrates excellent form with all progressions of exercises today including increased repetitions and resistance with ankle weights. Minimal cueing required from PT for form cueing. She was able to tolerate addition of LE ankle weights for mat therex today.  Aggravating factors include STS and stairs up/down so keeping reps low with these.    OBJECTIVE IMPAIRMENTS:  Abnormal gait, decreased activity tolerance, decreased balance, decreased mobility, difficulty walking, decreased ROM, decreased strength, increased edema, increased fascial restrictions, increased muscle spasms, impaired flexibility, improper body mechanics, postural dysfunction, obesity, and pain.   ACTIVITY LIMITATIONS: carrying, lifting, bending, sitting, standing, squatting, sleeping, stairs, transfers, bed mobility, bathing, toileting, dressing, and caring for others  PARTICIPATION LIMITATIONS: meal prep, cleaning, laundry, driving, shopping, community activity, occupation, yard work, and church  PERSONAL FACTORS: Fitness, Past/current experiences, Profession, and 1-2 comorbidities: osteoporosis and anxiety are also affecting patient's functional outcome.   REHAB POTENTIAL: Good  CLINICAL DECISION MAKING: Stable/uncomplicated  EVALUATION COMPLEXITY: Low   GOALS: Goals reviewed with patient? Yes  SHORT TERM GOALS: Target date: 07/28/2024   Patient will be independent with initial HEP  Baseline: Goal status: ONGOING 8/1  2.  Pain report to be no greater than 4/10  Baseline:  Goal status: INITIAL   LONG TERM GOALS: Target date: 08/25/2024   Patient to be independent with advanced HEP  Baseline:  Goal status: INITIAL  2.  Patient to report pain no greater than 2/10  Baseline:  Goal status: INITIAL  3.  Functional scores to improve by 2-3 seconds Baseline:  Goal status: INITIAL  4.  LEFS score to improve to 55 Baseline:  Goal status: INITIAL  5.  Patient to report 85% improvement in overall symptoms  Baseline:  Goal status: INITIAL  6.  Patient to  be able to ascend and descend steps without pain or no greater than 2/10  Baseline:  Goal status: INITIAL   PLAN:  PT FREQUENCY: 1-2x/week  PT DURATION: 8 weeks  PLANNED INTERVENTIONS: 97110-Therapeutic exercises, 97530- Therapeutic activity, 97112- Neuromuscular re-education, [phone]- Self Care, 02859- Manual  therapy, [phone]- Gait training, [phone]- Aquatic Therapy, [phone]- Electrical stimulation (unattended), [phone]- Electrical stimulation (manual), 97016- Vasopneumatic device, Patient/Family education, Balance training, Stair training, Taping, Joint mobilization, Spinal mobilization, Compression bandaging, DME instructions, Cryotherapy, and Moist heat  PLAN FOR NEXT SESSION: Nustep, review HEP, quad rehab, hip strengthening  Celena Domino, PT, DPT 07/26/24 11:29 AM  [facility] [address], [address] [place], [place] [address] Phone # [phone] Fax [phone]

## 2024-08-01 NOTE — Therapy (Signed)
 OUTPATIENT PHYSICAL THERAPY LOWER EXTREMITY TREATMENT   Patient Name: Kimberly Ryan MRN: [id number] DOB:08/04/1951, 73 y.o., female Today's Date: 08/02/2024  END OF SESSION:  PT End of Session - 08/02/24 1144     Visit Number 5    Authorization Type [employer]    Progress Note Due on Visit 10    PT Start Time 1110    PT Stop Time 1145    PT Time Calculation (min) 35 min    Activity Tolerance Patient tolerated treatment well    Behavior During Therapy WFL for tasks assessed/performed              Past Medical History:  Diagnosis Date   Anxiety    Arthritis    GERD (gastroesophageal reflux disease)    Headache(784.0)    Shortness of breath    with exertion    Thyroid  disease    Past Surgical History:  Procedure Laterality Date   MYOMECTOMY  2000   RHINOPLASTY  1953   THYROID  LOBECTOMY  08/17/2012   Procedure: THYROID  LOBECTOMY;  Surgeon: Camellia CHRISTELLA Blush, MD,FACS;  Location: [facility];  Service: General;  Laterality: Left;  Left Thyroid  Lobectomy   TONSILLECTOMY  1954   Patient Active Problem List   Diagnosis Date Noted   OP (osteoporosis) 01/06/2023   Postsurgical hypothyroidism 11/04/2012   Multiple thyroid  nodules 12/19/2011    PCP: Domenick Loma, NP  REFERRING PROVIDER: Duwayne Purchase, MD  REFERRING DIAG: M17.11 (ICD-10-CM) - Unilateral primary osteoarthritis, right knee  THERAPY DIAG:  Chronic pain of right knee  Muscle weakness (generalized)  Stiffness of right knee, not elsewhere classified  Difficulty in walking, not elsewhere classified  Cramp and spasm  Rationale for Evaluation and Treatment: Rehabilitation  ONSET DATE: 06/13/2024  SUBJECTIVE:   SUBJECTIVE STATEMENT: Hurt my L knee on the airplane, but it's better today.   PERTINENT HISTORY: na PAIN:  Are you having pain? Yes: NPRS scale: 0/10 at rest, at worst 4/10 Pain location: right knee Pain description: aching Aggravating factors: stairs, prolonged walking  and standing, down inclines Relieving factors: rest, meds, ice  PRECAUTIONS: None  RED FLAGS: None   WEIGHT BEARING RESTRICTIONS: No  FALLS:  Has patient fallen in last 6 months? 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   NEXT MD VISIT: prn  OBJECTIVE:  Note: Objective measures were completed at Evaluation unless otherwise noted.  DIAGNOSTIC FINDINGS: xrays  PATIENT SURVEYS:  LEFS  Extreme difficulty/unable (0), Quite a bit of difficulty (1), Moderate difficulty (2), Little difficulty (3), No difficulty (4) Survey date:    Any of your usual work, housework or school activities 3  2. Usual hobbies, recreational or sporting activities 3  3. Getting into/out of the bath 3  4. Walking between rooms 4  5. Putting on socks/shoes 4  6. Squatting  3  7. Lifting an object, like a bag of groceries from the floor 3  8. Performing light activities  around your home 4  9. Performing heavy activities around your home 2  10. Getting into/out of a car 3  11. Walking 2 blocks 4  12. Walking 1 mile 4  13. Going up/down 10 stairs (1 flight) 1  14. Standing for 1 hour 2  15.  sitting for 1 hour 4  16. Running on even ground 0  17. Running on uneven ground 0  18. Making sharp turns while running fast 0  19. Hopping  0  20. Rolling over in bed 4  Score total:  51/80      COGNITION: Overall cognitive status: Within functional limits for tasks assessed     SENSATION: WFL  POSTURE: Right knee valgus  PALPATION: Crepitus bilaterally (R > L) on seated knee flex/ext  LOWER EXTREMITY ROM:  All WFL with exception of right knee extension which is   LOWER EXTREMITY MMT:  MMT Right eval Left eval  Hip flexion    Hip extension    Hip abduction    Hip adduction    Hip internal rotation    Hip external rotation    Knee flexion    Knee extension    Ankle dorsiflexion    Ankle plantarflexion    Ankle inversion    Ankle eversion     (Blank rows = not tested)  LOWER EXTREMITY SPECIAL TESTS:  Knee special tests: Patellafemoral grind test: positive   FUNCTIONAL TESTS:  5 times sit to stand: 16.06 sec Timed up and go (TUG): 7.84 sec  GAIT: Distance walked: 30 feet Assistive device utilized: None Level of assistance: Complete Independence Comments: antalgic                                                                                                                              TREATMENT DATE:  08/02/24 Patient 10 min late Supine SAQ 1.5lb 2x8 bil SLR 1.5 lb 2x10 bil  Seated LAQ 1.5lb x10 bil SL hip abd 1.5lb x10 bil SL reverse clam yellow loop (medium resistance) 2x10 bil Sl clam red loop 2x 10 B Prone knee flexion 3lb 2x10 Bridge with red loop hip abd band x10 hold 5   07/26/24 NuStep L1 x 6' Supine SAQ 1.5lb 2x10 bil, then SLR 1lb 2x10 bil  Seated LAQ 1.5lb x10 bil SL hip abd 1.5lb x10 bil SL clam and reverse clam yellow loop (medium resistance) x10 bil Prone knee flexion 3lb 2x10 Prone hip ext pressing pelvis into mat table x10 each LE Bridge with red loop hip abd band x10 hold 5  07/19/24 NuStep L1 x 2' Supine SAQ 1lb x10 bil, then SLR 1lb x 8 bil (fatigue) SL hip abd 1lb x10 bil SL clam and reverse clam blue loop x10 bil Prone knee flexion 2lb 2x10 Prone hip ext pressing pelvis into mat table x10 each LE Bridge with  read loop hip abd band x10 hold 5 Leg press 80lb 2x15  07/15/24 NuStep L1 x1' Supine  quad set 10x5 bil LE SAQ Rt 10x5 SAQ to SLR Rt x10 Bridge with read loop hip abd band x10 hold 5 SL hip abd bil x10 SL clam red loop tied LAQ x10 hold 5 bil Sit to stand hold 5lb with chest press x5 (had some posterior knee pain) Step ups Rt LE 4 x10 Step down heel taps on 2 step 5x bil and 4 step 5x bil (can't get Lt heel to floor from 4) Step up 6 step march to 3rd step x5 each LE (Rt knee some pain last rep)  PATIENT EDUCATION:  Education details: Initiated HEP, educated on anatomy of the knee, valgus position of knee, importance of hip strength and proper alignment to avoid further deformity.  Person educated: Patient Education method: Explanation, Demonstration, Verbal cues, and Handouts Education comprehension: verbalized understanding, returned demonstration, and verbal cues required  HOME EXERCISE PROGRAM: Access Code: [email or web address] URL: [email or web address] Date: 07/15/2024 Prepared by: Orvil Beuhring  Exercises - Supine Quadricep Sets  - 1 x daily - 7 x weekly - 1 sets - 20 reps - Supine Knee Extension Strengthening  - 1 x daily - 7 x weekly - 1 sets - 20 reps - Seated Long Arc Quad  - 1 x daily - 7 x weekly - 2 sets - 10 reps - 5 hold - Seated Knee Extension Stretch with Chair  - 1 x daily - 7 x weekly - 1 sets - 1 reps - work up to 30 min hold - Bridge with Hip Abduction and Resistance  - 1 x daily - 7 x weekly - 2 sets - 10 reps - Supine Active Straight Leg Raise  - 1 x daily - 7 x weekly - 2 sets - 10 reps - Sidelying Hip Abduction  - 1 x daily - 7 x weekly - 2 sets - 10 reps - Clamshell with Resistance  - 1 x daily - 7 x weekly - 2 sets - 10 reps  ASSESSMENT:  CLINICAL IMPRESSION:  Patient reports she had some increased knee pain x 4 days after twisting it on the airplane. She denies pain today and was able to perform all TE without increased pain. She had  some pain with initial reps with SAQ, but that resolved. Able to progress clams to red  loop today. Pt continues to demonstrate potential for improvement and would benefit from continued skilled therapy to address impairments.    OBJECTIVE IMPAIRMENTS: Abnormal gait, decreased activity tolerance, decreased balance, decreased mobility, difficulty walking, decreased ROM, decreased strength, increased edema, increased fascial restrictions, increased muscle spasms, impaired flexibility, improper body mechanics, postural dysfunction, obesity, and pain.   ACTIVITY LIMITATIONS: carrying, lifting, bending, sitting, standing, squatting, sleeping, stairs, transfers, bed mobility, bathing, toileting, dressing, and caring for others  PARTICIPATION LIMITATIONS: meal prep, cleaning, laundry, driving, shopping, community activity, occupation, yard work, and church  PERSONAL FACTORS: Fitness, Past/current experiences, Profession, and 1-2 comorbidities: osteoporosis and anxiety are also affecting patient's functional outcome.   REHAB POTENTIAL: Good  CLINICAL DECISION MAKING: Stable/uncomplicated  EVALUATION COMPLEXITY: Low   GOALS: Goals reviewed with patient? Yes  SHORT TERM GOALS: Target date: 07/28/2024   Patient will be independent with initial HEP  Baseline: Goal status: ONGOING 8/1  2.  Pain report to be no greater than 4/10  Baseline:  Goal status: INITIAL   LONG TERM GOALS: Target date: 08/25/2024   Patient to be independent with advanced HEP  Baseline:  Goal status: INITIAL  2.  Patient to report pain no  greater than 2/10  Baseline:  Goal status: INITIAL  3.  Functional scores to improve by 2-3 seconds Baseline:  Goal status: INITIAL  4.  LEFS score to improve to 55 Baseline:  Goal status: INITIAL  5.  Patient to report 85% improvement in overall symptoms  Baseline:  Goal status: INITIAL  6.  Patient to be able to ascend and descend steps without pain or no greater than  2/10  Baseline:  Goal status: INITIAL   PLAN:  PT FREQUENCY: 1-2x/week  PT DURATION: 8 weeks  PLANNED INTERVENTIONS: 97110-Therapeutic exercises, 97530- Therapeutic activity, 97112- Neuromuscular re-education, [phone]- Self Care, 02859- Manual therapy, [phone]- Gait training, [phone]- Aquatic Therapy, [phone]- Electrical stimulation (unattended), [phone]- Electrical stimulation (manual), 97016- Vasopneumatic device, Patient/Family education, Balance training, Stair training, Taping, Joint mobilization, Spinal mobilization, Compression bandaging, DME instructions, Cryotherapy, and Moist heat  PLAN FOR NEXT SESSION: Nustep, review HEP, quad rehab, hip strengthening  Mliss Cummins, PT  08/02/24 11:48 AM  [facility] [address], [address] [place], [place] [address] Phone # [phone] Fax [phone]

## 2024-08-02 ENCOUNTER — Ambulatory Visit: Admitting: Physical Therapy

## 2024-08-02 DIAGNOSIS — R262 Difficulty in walking, not elsewhere classified: Secondary | ICD-10-CM

## 2024-08-02 DIAGNOSIS — M79641 Pain in right hand: Secondary | ICD-10-CM | POA: Diagnosis not present

## 2024-08-02 DIAGNOSIS — R252 Cramp and spasm: Secondary | ICD-10-CM

## 2024-08-02 DIAGNOSIS — G8929 Other chronic pain: Secondary | ICD-10-CM

## 2024-08-02 DIAGNOSIS — G5601 Carpal tunnel syndrome, right upper limb: Secondary | ICD-10-CM | POA: Diagnosis not present

## 2024-08-02 DIAGNOSIS — M25561 Pain in right knee: Secondary | ICD-10-CM | POA: Diagnosis not present

## 2024-08-02 DIAGNOSIS — M6281 Muscle weakness (generalized): Secondary | ICD-10-CM

## 2024-08-02 DIAGNOSIS — M25661 Stiffness of right knee, not elsewhere classified: Secondary | ICD-10-CM

## 2024-08-03 DIAGNOSIS — R7303 Prediabetes: Secondary | ICD-10-CM | POA: Diagnosis not present

## 2024-08-03 DIAGNOSIS — E039 Hypothyroidism, unspecified: Secondary | ICD-10-CM | POA: Diagnosis not present

## 2024-08-03 DIAGNOSIS — E7800 Pure hypercholesterolemia, unspecified: Secondary | ICD-10-CM | POA: Diagnosis not present

## 2024-08-03 DIAGNOSIS — E559 Vitamin D deficiency, unspecified: Secondary | ICD-10-CM | POA: Diagnosis not present

## 2024-08-03 LAB — COMPREHENSIVE METABOLIC PANEL WITH GFR: EGFR: 79

## 2024-08-04 ENCOUNTER — Ambulatory Visit: Admitting: Physical Therapy

## 2024-08-04 DIAGNOSIS — M25531 Pain in right wrist: Secondary | ICD-10-CM

## 2024-08-04 DIAGNOSIS — M25561 Pain in right knee: Secondary | ICD-10-CM | POA: Diagnosis not present

## 2024-08-04 DIAGNOSIS — M79644 Pain in right finger(s): Secondary | ICD-10-CM

## 2024-08-04 DIAGNOSIS — R293 Abnormal posture: Secondary | ICD-10-CM

## 2024-08-04 DIAGNOSIS — R262 Difficulty in walking, not elsewhere classified: Secondary | ICD-10-CM

## 2024-08-04 DIAGNOSIS — G8929 Other chronic pain: Secondary | ICD-10-CM

## 2024-08-04 DIAGNOSIS — M25661 Stiffness of right knee, not elsewhere classified: Secondary | ICD-10-CM

## 2024-08-04 DIAGNOSIS — R252 Cramp and spasm: Secondary | ICD-10-CM

## 2024-08-04 DIAGNOSIS — M6281 Muscle weakness (generalized): Secondary | ICD-10-CM

## 2024-08-04 NOTE — Therapy (Signed)
 OUTPATIENT PHYSICAL THERAPY LOWER EXTREMITY TREATMENT   Patient Name: Kimberly Ryan MRN: [id number] DOB:1951/05/17, 73 y.o., female Today's Date: 08/04/2024  END OF SESSION:  PT End of Session - 08/04/24 1108     Visit Number 6    Date for PT Re-Evaluation 08/25/24    Authorization Type [employer]    Progress Note Due on Visit 10    PT Start Time 1100    PT Stop Time 1145    PT Time Calculation (min) 45 min    Activity Tolerance Patient tolerated treatment well    Behavior During Therapy WFL for tasks assessed/performed              Past Medical History:  Diagnosis Date   Anxiety    Arthritis    GERD (gastroesophageal reflux disease)    Headache(784.0)    Shortness of breath    with exertion    Thyroid  disease    Past Surgical History:  Procedure Laterality Date   MYOMECTOMY  2000   RHINOPLASTY  1953   THYROID  LOBECTOMY  08/17/2012   Procedure: THYROID  LOBECTOMY;  Surgeon: Camellia CHRISTELLA Blush, MD,FACS;  Location: [facility];  Service: General;  Laterality: Left;  Left Thyroid  Lobectomy   TONSILLECTOMY  1954   Patient Active Problem List   Diagnosis Date Noted   OP (osteoporosis) 01/06/2023   Postsurgical hypothyroidism 11/04/2012   Multiple thyroid  nodules 12/19/2011    PCP: Domenick Loma, NP  REFERRING PROVIDER: Duwayne Purchase, MD  REFERRING DIAG: M17.11 (ICD-10-CM) - Unilateral primary osteoarthritis, right knee  THERAPY DIAG:  Chronic pain of right knee  Stiffness of right knee, not elsewhere classified  Muscle weakness (generalized)  Difficulty in walking, not elsewhere classified  Pain in right wrist  Pain in right finger(s)  Cramp and spasm  Abnormal posture  Rationale for Evaluation and Treatment: Rehabilitation  ONSET DATE: 06/13/2024  SUBJECTIVE:   SUBJECTIVE STATEMENT: Left knee is still hurting from where I tweaked it on the plane.  Right knee and wrist are still bothersome as well.      PERTINENT  HISTORY: na PAIN:  08/04/24 Are you having pain? Yes: NPRS scale: 0/10 right knee, 1/10 left knee,  3/10 at worst for both Pain location: right knee Pain description: aching Aggravating factors: stairs, prolonged walking and standing, down inclines Relieving factors: rest, meds, ice  PRECAUTIONS: None  RED FLAGS: None   WEIGHT BEARING RESTRICTIONS: No  FALLS:  Has patient fallen in last 6 months? 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   NEXT MD VISIT: prn  OBJECTIVE:  Note: Objective measures were completed at Evaluation unless otherwise noted.  DIAGNOSTIC FINDINGS: xrays  PATIENT SURVEYS:  LEFS  Extreme difficulty/unable (0), Quite a bit of difficulty (1), Moderate difficulty (2), Little difficulty (3), No difficulty (4) Survey date:    Any of your usual work, housework or school activities 3  2. Usual hobbies, recreational or sporting activities 3  3. Getting into/out of the bath 3  4. Walking between rooms 4  5. Putting on socks/shoes 4  6. Squatting  3  7. Lifting an object, like a bag of groceries from the floor 3  8. Performing light activities around your home 4  9. Performing heavy activities around your home 2  10. Getting into/out of a car 3  11. Walking 2 blocks 4  12. Walking 1 mile 4  13. Going up/down 10 stairs (1 flight) 1  14. Standing for 1  hour 2  15.  sitting for 1 hour 4  16. Running on even ground 0  17. Running on uneven ground 0  18. Making sharp turns while running fast 0  19. Hopping  0  20. Rolling over in bed 4  Score total:  51/80     COGNITION: Overall cognitive status: Within functional limits for tasks assessed     SENSATION: WFL  POSTURE: Right knee valgus  PALPATION: Crepitus bilaterally (R > L) on seated knee flex/ext  LOWER EXTREMITY ROM:  All WFL with exception of right knee extension which is   LOWER EXTREMITY MMT:  MMT Right eval Left eval  Hip flexion    Hip extension    Hip abduction    Hip adduction    Hip internal rotation    Hip external rotation    Knee flexion    Knee extension    Ankle dorsiflexion    Ankle plantarflexion    Ankle inversion    Ankle eversion     (Blank rows = not tested)  LOWER EXTREMITY SPECIAL TESTS:  Knee special tests: Patellafemoral grind test: positive   FUNCTIONAL TESTS:  5 times sit to stand: 16.06 sec Timed up and go (TUG): 7.84 sec  GAIT: Distance walked: 30 feet Assistive device utilized: None Level of assistance: Complete Independence Comments: antalgic                                                                                                                              TREATMENT DATE:  08/04/24 Nustep x 5 min Lateral band walks with yellow loop x 2 laps of 15 feet Seated clam with yellow loop x 20 Hook lying clam with yellow loop x 20 Supine quad sets x 20 bilateral Supine TKE over larger green noodle 2 x 10 with 2.5# both Supine SAQ over larger teal bolster 2 x 10 with 2.5# both SLR with 2.5# 2 x 10 each LE Seated LAQ with 2.5# 2 x 10 each LE Sit to stand x 10 Squat to mat table x 10  08/02/24 Patient 10 min late Supine SAQ 1.5lb 2x8 bil SLR 1.5 lb 2x10 bil  Seated LAQ 1.5lb x10 bil SL hip abd 1.5lb x10 bil SL reverse clam yellow loop (medium resistance) 2x10 bil Sl clam red loop 2x 10 B Prone knee flexion 3lb  2x10 Bridge with red loop hip abd band x10 hold 5  07/26/24 NuStep L1 x 6' Supine SAQ 1.5lb 2x10 bil, then SLR 1lb 2x10 bil  Seated LAQ 1.5lb x10 bil SL hip abd 1.5lb x10 bil SL clam and reverse clam yellow loop (medium resistance) x10 bil Prone knee flexion 3lb 2x10 Prone hip ext pressing pelvis into mat table x10 each LE Bridge with red loop hip abd band x10 hold 5  PATIENT EDUCATION:  Education details: Initiated HEP, educated on anatomy of the knee, valgus position of knee, importance of hip strength and proper alignment to avoid further deformity.  Person educated: Patient Education method: Explanation, Demonstration, Verbal cues, and Handouts Education comprehension: verbalized understanding, returned demonstration, and verbal cues required  HOME EXERCISE PROGRAM: Access Code: [email or web address] URL: [email or web address] Date: 07/15/2024 Prepared by: Orvil Beuhring  Exercises - Supine Quadricep Sets  - 1 x daily - 7 x weekly - 1 sets - 20 reps - Supine Knee Extension Strengthening  - 1 x daily - 7 x weekly - 1 sets - 20 reps - Seated Long Arc Quad  - 1 x daily - 7 x weekly - 2 sets - 10 reps - 5 hold - Seated Knee Extension Stretch with Chair  - 1 x daily - 7 x weekly - 1 sets - 1 reps - work up to 30 min hold - Bridge with Hip Abduction and Resistance  - 1 x daily - 7 x weekly - 2 sets - 10 reps - Supine Active Straight Leg Raise  - 1 x daily - 7 x weekly - 2 sets - 10 reps - Sidelying Hip Abduction  - 1 x daily - 7 x weekly - 2 sets - 10 reps - Clamshell with Resistance  - 1 x daily - 7 x weekly - 2 sets - 10 reps  ASSESSMENT:  CLINICAL IMPRESSION: Yari is able to do all exercises with little to no pain.  We added functional sit to stand and squats.  Patient was able to do this from mat table.  She is having pain in both knees due to an injury while traveling but overall, did well today.  She is well motivated and compliant.  She should continue to do well.   She would benefit from continuing skilled PT to progress toward goals below.    OBJECTIVE IMPAIRMENTS: Abnormal gait, decreased activity tolerance, decreased balance, decreased mobility, difficulty walking, decreased ROM, decreased strength, increased edema, increased fascial restrictions, increased muscle spasms, impaired flexibility, improper body mechanics, postural dysfunction, obesity, and pain.   ACTIVITY LIMITATIONS: carrying, lifting, bending, sitting, standing, squatting, sleeping, stairs, transfers, bed mobility, bathing, toileting, dressing, and caring for others  PARTICIPATION LIMITATIONS: meal prep, cleaning, laundry, driving, shopping, community activity, occupation, yard work, and church  PERSONAL FACTORS: Fitness, Past/current experiences, Profession, and 1-2 comorbidities: osteoporosis and anxiety are also affecting patient's functional outcome.   REHAB POTENTIAL: Good  CLINICAL DECISION MAKING: Stable/uncomplicated  EVALUATION COMPLEXITY: Low   GOALS: Goals reviewed with patient? Yes  SHORT TERM GOALS: Target date: 07/28/2024   Patient will be independent with initial HEP  Baseline: Goal status: ONGOING 8/1  2.  Pain report to be no greater than 4/10  Baseline:  Goal status: INITIAL   LONG TERM GOALS: Target date: 08/25/2024   Patient to be independent with advanced HEP  Baseline:  Goal status: INITIAL  2.  Patient to report pain no greater than 2/10  Baseline:  Goal status: INITIAL  3.  Functional scores to improve by 2-3 seconds Baseline:  Goal status: INITIAL  4.  LEFS score to improve to 55 Baseline:  Goal status: INITIAL  5.  Patient to report 85% improvement in overall symptoms  Baseline:  Goal status: INITIAL  6.  Patient to be able to ascend and descend steps without pain or no greater than 2/10  Baseline:  Goal status: INITIAL   PLAN:  PT FREQUENCY: 1-2x/week  PT DURATION: 8 weeks  PLANNED INTERVENTIONS: 97110-Therapeutic  exercises, 97530- Therapeutic activity, 97112- Neuromuscular re-education, 97535- Self Care, 02859- Manual therapy, [phone]- Gait training, [phone]- Aquatic Therapy, [phone]- Electrical stimulation (unattended), [phone]- Electrical stimulation (manual), 97016- Vasopneumatic device, Patient/Family education, Balance training, Stair training, Taping, Joint mobilization, Spinal mobilization, Compression bandaging, DME instructions, Cryotherapy, and Moist heat  PLAN FOR NEXT SESSION: Nustep, review HEP, quad rehab, hip strengthening  Missy Baksh B. Spyridon Hornstein, PT 08/04/24 10:18 PM [facility] [address] [place], [place] [address] Phone # [phone] Fax [phone]

## 2024-08-09 ENCOUNTER — Encounter: Payer: Self-pay | Admitting: Physical Therapy

## 2024-08-09 ENCOUNTER — Ambulatory Visit: Admitting: Physical Therapy

## 2024-08-09 DIAGNOSIS — M25661 Stiffness of right knee, not elsewhere classified: Secondary | ICD-10-CM

## 2024-08-09 DIAGNOSIS — M6281 Muscle weakness (generalized): Secondary | ICD-10-CM

## 2024-08-09 DIAGNOSIS — G8929 Other chronic pain: Secondary | ICD-10-CM

## 2024-08-09 DIAGNOSIS — R262 Difficulty in walking, not elsewhere classified: Secondary | ICD-10-CM

## 2024-08-09 DIAGNOSIS — M25561 Pain in right knee: Secondary | ICD-10-CM | POA: Diagnosis not present

## 2024-08-09 NOTE — Therapy (Signed)
 OUTPATIENT PHYSICAL THERAPY LOWER EXTREMITY TREATMENT   Patient Name: Kimberly Ryan MRN: [id number] DOB:November 05, 1951, 73 y.o., female Today's Date: 08/09/2024  END OF SESSION:  PT End of Session - 08/09/24 1107     Visit Number 7    Date for PT Re-Evaluation 08/25/24    Authorization Type [employer]    Progress Note Due on Visit 10    PT Start Time 1107    PT Stop Time 1145    PT Time Calculation (min) 38 min              Past Medical History:  Diagnosis Date   Anxiety    Arthritis    GERD (gastroesophageal reflux disease)    Headache(784.0)    Shortness of breath    with exertion    Thyroid  disease    Past Surgical History:  Procedure Laterality Date   MYOMECTOMY  2000   RHINOPLASTY  1953   THYROID  LOBECTOMY  08/17/2012   Procedure: THYROID  LOBECTOMY;  Surgeon: Camellia CHRISTELLA Blush, MD,FACS;  Location: [facility];  Service: General;  Laterality: Left;  Left Thyroid  Lobectomy   TONSILLECTOMY  1954   Patient Active Problem List   Diagnosis Date Noted   OP (osteoporosis) 01/06/2023   Postsurgical hypothyroidism 11/04/2012   Multiple thyroid  nodules 12/19/2011    PCP: Domenick Loma, NP  REFERRING PROVIDER: Duwayne Purchase, MD  REFERRING DIAG: M17.11 (ICD-10-CM) - Unilateral primary osteoarthritis, right knee  THERAPY DIAG:  Chronic pain of right knee  Stiffness of right knee, not elsewhere classified  Muscle weakness (generalized)  Difficulty in walking, not elsewhere classified  Rationale for Evaluation and Treatment: Rehabilitation  ONSET DATE: 06/13/2024  SUBJECTIVE:   SUBJECTIVE STATEMENT: No pain today.  Still have pain with stairs and hills. Mostly going down 4/10, 2-3/10 going up.  PERTINENT HISTORY: na PAIN:  08/09/24 Are you having pain? Yes: NPRS scale: 0/10 right knee, 0/10 left knee,  3/10 at worst for both Pain location: right knee Pain description: aching Aggravating factors: stairs, prolonged walking and standing, down  inclines Relieving factors: rest, meds, ice  PRECAUTIONS: None  RED FLAGS: None   WEIGHT BEARING RESTRICTIONS: No  FALLS:  Has patient fallen in last 6 months? 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   NEXT MD VISIT: prn  OBJECTIVE:  Note: Objective measures were completed at Evaluation unless otherwise noted.  DIAGNOSTIC FINDINGS: xrays  PATIENT SURVEYS:  LEFS  Extreme difficulty/unable (0), Quite a bit of difficulty (1), Moderate difficulty (2), Little difficulty (3), No difficulty (4) Survey date:    Any of your usual work, housework or school activities 3  2. Usual hobbies, recreational or sporting activities 3  3. Getting into/out of the bath 3  4. Walking between rooms 4  5. Putting on socks/shoes 4  6. Squatting  3  7. Lifting an object, like a bag of groceries from the floor 3  8. Performing light activities around your  home 4  9. Performing heavy activities around your home 2  10. Getting into/out of a car 3  11. Walking 2 blocks 4  12. Walking 1 mile 4  13. Going up/down 10 stairs (1 flight) 1  14. Standing for 1 hour 2  15.  sitting for 1 hour 4  16. Running on even ground 0  17. Running on uneven ground 0  18. Making sharp turns while running fast 0  19. Hopping  0  20. Rolling over in bed 4  Score total:  51/80     COGNITION: Overall cognitive  status: Within functional limits for tasks assessed     SENSATION: WFL  POSTURE: Right knee valgus  PALPATION: Crepitus bilaterally (R > L) on seated knee flex/ext  LOWER EXTREMITY ROM:  All WFL with exception of right knee extension which is   LOWER EXTREMITY MMT:  MMT Right eval Left eval  Hip flexion    Hip extension    Hip abduction    Hip adduction    Hip internal rotation    Hip external rotation    Knee flexion    Knee extension    Ankle dorsiflexion    Ankle plantarflexion    Ankle inversion    Ankle eversion     (Blank rows = not tested)  LOWER EXTREMITY SPECIAL TESTS:  Knee special tests: Patellafemoral grind test: positive   FUNCTIONAL TESTS:  5 times sit to stand: 16.06 sec Timed up and go (TUG): 7.84 sec  GAIT: Distance walked: 30 feet Assistive device utilized: None Level of assistance: Complete Independence Comments: antalgic                                                                                                                              TREATMENT DATE:  08/09/24 Nustep L 4 x 5 min Lateral band walks with yellow loop x 1 laps of 15 feet, then red loop at ankles 2x 15 ft 3 way step outs with red loop at ankles x 5 B Seated clam with yellow loop x 20 Sit to stand x 10 Squat to mat table x 10 Seated LAQ with 3# (increase next visit) 2 x 10 each LE Supine TKE over larger green noodle 2 x 10 with 3# both Supine SAQ over larger teal bolster 2 x 10 with 3# both (increase wt next visit) SLR with 3# 2 x 10 each LE Hook lying clam with red loop x 20 SL clam red loop 2 x 10    08/04/24 Nustep x 5 min Lateral band walks with yellow loop x 2 laps of 15 feet Seated clam with yellow loop x 20 Hook lying clam with yellow loop x 20 Supine quad sets x 20 bilateral Supine TKE over larger green noodle 2 x 10 with 2.5# both Supine SAQ over larger teal bolster 2 x 10 with 2.5# both SLR with 2.5# 2 x 10 each LE Seated LAQ with  2.5# 2 x 10 each  LE Sit to stand x 10 Squat to mat table x 10  08/02/24 Patient 10 min late Supine SAQ 1.5lb 2x8 bil SLR 1.5 lb 2x10 bil  Seated LAQ 1.5lb x10 bil SL hip abd 1.5lb x10 bil SL reverse clam yellow loop (medium resistance) 2x10 bil Sl clam red loop 2x 10 B Prone knee flexion 3lb 2x10 Bridge with red loop hip abd band x10 hold 5   07/26/24 NuStep L1 x 6' Supine SAQ 1.5lb 2x10 bil, then SLR 1lb 2x10 bil  Seated LAQ 1.5lb x10 bil SL hip abd 1.5lb x10 bil SL clam and reverse clam yellow loop (medium resistance) x10 bil Prone knee flexion 3lb 2x10 Prone hip ext pressing pelvis into mat table x10 each LE Bridge with red loop hip abd band x10 hold 5  PATIENT EDUCATION:  Education details: Initiated HEP, educated on anatomy of the knee, valgus position of knee, importance of hip strength and proper alignment to avoid further deformity.  Person educated: Patient Education method: Explanation, Demonstration, Verbal cues, and Handouts Education comprehension: verbalized understanding, returned demonstration, and verbal cues required  HOME EXERCISE PROGRAM: Access Code: [email or web address] URL: [email or web address] Date: 07/15/2024 Prepared by: Orvil Beuhring  Exercises - Supine Quadricep Sets  - 1 x daily - 7 x weekly - 1 sets - 20 reps - Supine Knee Extension Strengthening  - 1 x daily - 7 x weekly - 1 sets - 20 reps - Seated Long Arc Quad  - 1 x daily - 7 x weekly - 2 sets - 10 reps - 5 hold - Seated Knee Extension Stretch with Chair  - 1 x daily - 7 x weekly - 1 sets - 1 reps - work up to 30 min hold - Bridge with Hip Abduction and Resistance  - 1 x daily - 7 x weekly - 2 sets - 10 reps - Supine Active Straight Leg Raise  - 1 x daily - 7 x weekly - 2 sets - 10 reps - Sidelying Hip Abduction  - 1 x daily - 7 x weekly - 2 sets - 10 reps - Clamshell with Resistance  - 1 x daily - 7 x weekly - 2 sets - 10 reps  ASSESSMENT:  CLINICAL IMPRESSION: Patient reports she no longer  has pain over 4/10, but does have 4/10 pain going down hills and stairs. She was able to progress resistance with most exercises today. She did have some pain in the left knee with TKE, but all others did not cause pain. Would benefit from trying step downs next visit to address hill pain.  OBJECTIVE IMPAIRMENTS: Abnormal gait, decreased activity tolerance, decreased balance, decreased mobility, difficulty walking, decreased ROM, decreased strength, increased edema, increased fascial restrictions, increased muscle spasms, impaired flexibility, improper body mechanics, postural dysfunction, obesity, and pain.   ACTIVITY LIMITATIONS: carrying, lifting, bending, sitting, standing, squatting, sleeping, stairs, transfers, bed mobility, bathing, toileting, dressing, and caring for others  PARTICIPATION LIMITATIONS: meal prep, cleaning, laundry, driving, shopping, community activity, occupation, yard work, and church  PERSONAL FACTORS: Fitness, Past/current experiences, Profession, and 1-2 comorbidities: osteoporosis and anxiety are also affecting patient's functional outcome.   REHAB POTENTIAL: Good  CLINICAL DECISION MAKING: Stable/uncomplicated  EVALUATION COMPLEXITY: Low   GOALS: Goals reviewed with patient? Yes  SHORT TERM GOALS: Target date: 07/28/2024   Patient will be independent with initial HEP  Baseline: Goal status: ONGOING 8/1  2.  Pain report to be no greater than 4/10  Baseline:  Goal  status: MET 08/09/24   LONG TERM GOALS: Target date: 08/25/2024   Patient to be independent with advanced HEP  Baseline:  Goal status: INITIAL  2.  Patient to report pain no greater than 2/10  Baseline:  Goal status: INITIAL  3.  Functional scores to improve by 2-3 seconds Baseline:  Goal status: INITIAL  4.  LEFS score to improve to 55 Baseline:  Goal status: INITIAL  5.  Patient to report 85% improvement in overall symptoms  Baseline:  Goal status: INITIAL  6.  Patient to be  able to ascend and descend steps without pain or no greater than 2/10  Baseline:  Goal status: INITIAL   PLAN:  PT FREQUENCY: 1-2x/week  PT DURATION: 8 weeks  PLANNED INTERVENTIONS: 97110-Therapeutic exercises, 97530- Therapeutic activity, 97112- Neuromuscular re-education, [phone]- Self Care, 02859- Manual therapy, [phone]- Gait training, [phone]- Aquatic Therapy, [phone]- Electrical stimulation (unattended), [phone]- Electrical stimulation (manual), 97016- Vasopneumatic device, Patient/Family education, Balance training, Stair training, Taping, Joint mobilization, Spinal mobilization, Compression bandaging, DME instructions, Cryotherapy, and Moist heat  PLAN FOR NEXT SESSION: Nustep, review HEP, quad rehab, hip strengthening  Mliss Cummins, PT  08/09/24 11:48 AM [facility] [address], [address] [place], [place] [address] Phone # [phone] Fax [phone]

## 2024-08-11 ENCOUNTER — Ambulatory Visit: Admitting: Physical Therapy

## 2024-08-11 DIAGNOSIS — G8929 Other chronic pain: Secondary | ICD-10-CM

## 2024-08-11 DIAGNOSIS — M6281 Muscle weakness (generalized): Secondary | ICD-10-CM

## 2024-08-11 DIAGNOSIS — M25561 Pain in right knee: Secondary | ICD-10-CM | POA: Diagnosis not present

## 2024-08-11 DIAGNOSIS — R262 Difficulty in walking, not elsewhere classified: Secondary | ICD-10-CM

## 2024-08-11 DIAGNOSIS — M25661 Stiffness of right knee, not elsewhere classified: Secondary | ICD-10-CM

## 2024-08-11 NOTE — Therapy (Signed)
 OUTPATIENT PHYSICAL THERAPY LOWER EXTREMITY TREATMENT   Patient Name: Kimberly Ryan MRN: [id number] DOB:1951/01/23, 73 y.o., female Today's Date: 08/11/2024  END OF SESSION:  PT End of Session - 08/11/24 1107     Visit Number 8    Date for PT Re-Evaluation 08/25/24    Authorization Type [employer]    Progress Note Due on Visit 10    PT Start Time 1105    PT Stop Time 1145    PT Time Calculation (min) 40 min    Activity Tolerance Patient tolerated treatment well    Behavior During Therapy WFL for tasks assessed/performed               Past Medical History:  Diagnosis Date   Anxiety    Arthritis    GERD (gastroesophageal reflux disease)    Headache(784.0)    Shortness of breath    with exertion    Thyroid  disease    Past Surgical History:  Procedure Laterality Date   MYOMECTOMY  2000   RHINOPLASTY  1953   THYROID  LOBECTOMY  08/17/2012   Procedure: THYROID  LOBECTOMY;  Surgeon: Camellia CHRISTELLA Blush, MD,FACS;  Location: [facility];  Service: General;  Laterality: Left;  Left Thyroid  Lobectomy   TONSILLECTOMY  1954   Patient Active Problem List   Diagnosis Date Noted   OP (osteoporosis) 01/06/2023   Postsurgical hypothyroidism 11/04/2012   Multiple thyroid  nodules 12/19/2011    PCP: Domenick Loma, NP  REFERRING PROVIDER: Duwayne Purchase, MD  REFERRING DIAG: M17.11 (ICD-10-CM) - Unilateral primary osteoarthritis, right knee  THERAPY DIAG:  Chronic pain of right knee  Stiffness of right knee, not elsewhere classified  Muscle weakness (generalized)  Difficulty in walking, not elsewhere classified  Rationale for Evaluation and Treatment: Rehabilitation  ONSET DATE: 06/13/2024  SUBJECTIVE:   SUBJECTIVE STATEMENT: No pain today.    PERTINENT HISTORY: na PAIN:  08/09/24 Are you having pain? Yes: NPRS scale: 0/10 right knee, 0/10 left knee,  3/10 at worst for both Pain location: right knee Pain description: aching Aggravating factors:  stairs, prolonged walking and standing, down inclines Relieving factors: rest, meds, ice  PRECAUTIONS: None  RED FLAGS: None   WEIGHT BEARING RESTRICTIONS: No  FALLS:  Has patient fallen in last 6 months? 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   NEXT MD VISIT: prn  OBJECTIVE:  Note: Objective measures were completed at Evaluation unless otherwise noted.  DIAGNOSTIC FINDINGS: xrays  PATIENT SURVEYS:  LEFS  Extreme difficulty/unable (0), Quite a bit of difficulty (1), Moderate difficulty (2), Little difficulty (3), No difficulty (4) Survey date:    Any of your usual work, housework or school activities 3  2. Usual hobbies, recreational or sporting activities 3  3. Getting into/out of the bath 3  4. Walking between rooms 4  5. Putting on socks/shoes 4  6. Squatting  3  7. Lifting an object, like a bag of groceries from the floor 3  8. Performing light activities around your home 4  9. Performing heavy activities around your home 2  10. Getting into/out of a car 3  11. Walking 2 blocks 4  12. Walking 1 mile 4  13. Going up/down 10 stairs (1 flight) 1  14. Standing for 1 hour 2  15.  sitting for 1 hour 4  16. Running on even ground 0  17. Running on uneven ground 0  18. Making sharp turns while running fast 0  19. Hopping  0  20. Rolling over in bed 4  Score total:   51/80     COGNITION: Overall cognitive status: Within functional limits for tasks assessed     SENSATION: WFL  POSTURE: Right knee valgus  PALPATION: Crepitus bilaterally (R > L) on seated knee flex/ext  LOWER EXTREMITY ROM:  All WFL with exception of right knee extension which is   LOWER EXTREMITY MMT:  MMT Right eval Left eval  Hip flexion    Hip extension    Hip abduction    Hip adduction    Hip internal rotation    Hip external rotation    Knee flexion    Knee extension    Ankle dorsiflexion    Ankle plantarflexion    Ankle inversion    Ankle eversion     (Blank rows = not tested)  LOWER EXTREMITY SPECIAL TESTS:  Knee special tests: Patellafemoral grind test: positive   FUNCTIONAL TESTS:  5 times sit to stand: 16.06 sec Timed up and go (TUG): 7.84 sec  GAIT: Distance walked: 30 feet Assistive device utilized: None Level of assistance: Complete Independence Comments: antalgic                                                                                                                              TREATMENT DATE:  08/11/24 Nustep L 5 x 5 min Assessed descending stairs: pt turns and side steps down. Step downs 4 inch step  2x10 B Lateral band walks with red loop at ankles 2x 15 ft 3 way step outs with red loop at ankles x 5 B Seated clam with yellow loop x 20 Sit to stand x 10 Sit to stand with 5# KB with chest press 2x5 Leg press B 85# 2 x 10, Unilateral 40# with slow eccentric 2 x 10 R only (L knee started hurting posteriorly with double leg) SL clam red loop 2 x 10   08/09/24 Nustep L 4 x 5 min Lateral band walks with yellow loop x 1 laps of 15 feet, then red loop at ankles 2x 15 ft 3 way step outs with red loop at ankles x 5 B Seated clam with yellow loop x 20 Sit to stand x 10 Squat to mat table x 10 Seated LAQ with 3# (increase next visit) 2 x 10 each LE Supine TKE over larger green noodle 2 x 10 with 3# both Supine SAQ over larger teal  bolster 2 x 10 with 3# both (increase wt next visit) SLR with 3# 2 x 10 each LE Hook lying clam with red loop x 20 SL clam red loop 2 x 10    08/04/24 Nustep x 5 min Lateral band walks with yellow loop x 2 laps of 15 feet Seated clam with yellow loop x 20 Hook lying clam with yellow loop x 20 Supine quad sets x 20 bilateral Supine TKE over larger green noodle 2 x 10 with 2.5# both Supine SAQ over larger teal bolster 2 x 10 with 2.5# both SLR with 2.5# 2 x 10 each LE Seated LAQ with 2.5# 2 x 10 each LE Sit to stand x 10 Squat to mat table x 10  08/02/24 Patient 10 min late Supine SAQ 1.5lb 2x8 bil SLR 1.5 lb 2x10 bil  Seated LAQ 1.5lb x10 bil SL hip abd 1.5lb x10 bil SL reverse clam yellow loop (medium resistance) 2x10 bil Sl clam red loop 2x 10 B Prone knee flexion 3lb 2x10 Bridge with red loop hip abd band x10 hold 5  PATIENT EDUCATION:  Education details: Initiated HEP, educated on anatomy of the knee, valgus position of knee, importance of hip strength and proper alignment to avoid further deformity.  Person educated: Patient Education method: Explanation, Demonstration, Verbal cues, and Handouts Education comprehension: verbalized understanding, returned demonstration, and verbal cues required  HOME EXERCISE PROGRAM: Access Code: [email or web address] URL: [email or web address] Date: 07/15/2024 Prepared by: Orvil Beuhring  Exercises - Supine Quadricep Sets  - 1 x daily - 7 x weekly - 1 sets - 20 reps - Supine Knee Extension Strengthening  - 1 x daily - 7 x weekly - 1 sets - 20 reps - Seated Long Arc Quad  - 1 x daily - 7 x weekly - 2 sets - 10 reps - 5 hold - Seated Knee Extension Stretch with Chair  - 1 x daily - 7 x weekly - 1 sets - 1 reps - work up to 30 min hold - Bridge with Hip Abduction and Resistance  - 1 x daily - 7 x weekly - 2 sets - 10 reps - Supine Active Straight Leg Raise  - 1 x daily - 7 x weekly - 2 sets - 10 reps - Sidelying Hip Abduction  -  1 x daily - 7 x weekly - 2 sets - 10 reps - Clamshell with Resistance  - 1 x daily - 7 x weekly - 2 sets - 10 reps  ASSESSMENT:  CLINICAL IMPRESSION: Worked on step downs today initially as patient uses a side step to descend stairs. She demonstrates some R hip ADDuction with step down, but able to correct with verbal cues. We stayed with 4 inch step today as this was challenging to R quads. Also added leg press. Patient reported some pain in L post knee and thinks she may have a Baker's cyst.   OBJECTIVE IMPAIRMENTS: Abnormal gait, decreased activity tolerance, decreased balance, decreased mobility, difficulty walking, decreased ROM, decreased strength, increased edema, increased fascial restrictions, increased muscle spasms, impaired flexibility, improper body mechanics, postural dysfunction, obesity, and pain.   ACTIVITY LIMITATIONS: carrying, lifting, bending, sitting, standing, squatting, sleeping, stairs, transfers, bed mobility, bathing, toileting, dressing, and caring for others  PARTICIPATION LIMITATIONS: meal prep, cleaning, laundry, driving, shopping, community activity, occupation, yard work, and church  PERSONAL FACTORS: Fitness, Past/current experiences, Profession, and 1-2 comorbidities: osteoporosis and anxiety are also affecting patient's functional outcome.   REHAB POTENTIAL: Good  CLINICAL DECISION MAKING:  Stable/uncomplicated  EVALUATION COMPLEXITY: Low   GOALS: Goals reviewed with patient? Yes  SHORT TERM GOALS: Target date: 07/28/2024   Patient will be independent with initial HEP  Baseline: Goal status: ONGOING 8/1  2.  Pain report to be no greater than 4/10  Baseline:  Goal status: MET 08/09/24   LONG TERM GOALS: Target date: 08/25/2024   Patient to be independent with advanced HEP  Baseline:  Goal status: INITIAL  2.  Patient to report pain no greater than 2/10  Baseline:  Goal status: INITIAL  3.  Functional scores to improve by 2-3  seconds Baseline:  Goal status: INITIAL  4.  LEFS score to improve to 55 Baseline:  Goal status: INITIAL  5.  Patient to report 85% improvement in overall symptoms  Baseline:  Goal status: INITIAL  6.  Patient to be able to ascend and descend steps without pain or no greater than 2/10  Baseline:  Goal status: INITIAL   PLAN:  PT FREQUENCY: 1-2x/week  PT DURATION: 8 weeks  PLANNED INTERVENTIONS: 97110-Therapeutic exercises, 97530- Therapeutic activity, 97112- Neuromuscular re-education, [phone]- Self Care, 02859- Manual therapy, [phone]- Gait training, [phone]- Aquatic Therapy, [phone]- Electrical stimulation (unattended), [phone]- Electrical stimulation (manual), 97016- Vasopneumatic device, Patient/Family education, Balance training, Stair training, Taping, Joint mobilization, Spinal mobilization, Compression bandaging, DME instructions, Cryotherapy, and Moist heat  PLAN FOR NEXT SESSION: Nustep, review HEP, quad rehab, hip strengthening  Mliss Cummins, PT  08/11/24 11:53 AM [facility] [address] [place], [place] [address] Phone # [phone] Fax [phone]

## 2024-08-16 ENCOUNTER — Ambulatory Visit: Attending: Specialist

## 2024-08-16 DIAGNOSIS — R293 Abnormal posture: Secondary | ICD-10-CM | POA: Insufficient documentation

## 2024-08-16 DIAGNOSIS — M25561 Pain in right knee: Secondary | ICD-10-CM | POA: Diagnosis not present

## 2024-08-16 DIAGNOSIS — R262 Difficulty in walking, not elsewhere classified: Secondary | ICD-10-CM | POA: Insufficient documentation

## 2024-08-16 DIAGNOSIS — M6281 Muscle weakness (generalized): Secondary | ICD-10-CM | POA: Diagnosis not present

## 2024-08-16 DIAGNOSIS — R252 Cramp and spasm: Secondary | ICD-10-CM | POA: Insufficient documentation

## 2024-08-16 DIAGNOSIS — M25661 Stiffness of right knee, not elsewhere classified: Secondary | ICD-10-CM | POA: Insufficient documentation

## 2024-08-16 DIAGNOSIS — L304 Erythema intertrigo: Secondary | ICD-10-CM | POA: Diagnosis not present

## 2024-08-16 DIAGNOSIS — G8929 Other chronic pain: Secondary | ICD-10-CM | POA: Diagnosis not present

## 2024-08-16 NOTE — Therapy (Signed)
 OUTPATIENT PHYSICAL THERAPY LOWER EXTREMITY TREATMENT   Patient Name: Kimberly Ryan MRN: [id number] DOB:31-May-1951, 73 y.o., female Today's Date: 08/16/2024  END OF SESSION:  PT End of Session - 08/16/24 1113     Visit Number 9    Date for PT Re-Evaluation 08/25/24    Authorization Type [employer]    PT Start Time 1100    PT Stop Time 1147    PT Time Calculation (min) 47 min    Activity Tolerance Patient tolerated treatment well    Behavior During Therapy WFL for tasks assessed/performed               Past Medical History:  Diagnosis Date   Anxiety    Arthritis    GERD (gastroesophageal reflux disease)    Headache(784.0)    Shortness of breath    with exertion    Thyroid  disease    Past Surgical History:  Procedure Laterality Date   MYOMECTOMY  2000   RHINOPLASTY  1953   THYROID  LOBECTOMY  08/17/2012   Procedure: THYROID  LOBECTOMY;  Surgeon: Camellia CHRISTELLA Blush, MD,FACS;  Location: [facility];  Service: General;  Laterality: Left;  Left Thyroid  Lobectomy   TONSILLECTOMY  1954   Patient Active Problem List   Diagnosis Date Noted   OP (osteoporosis) 01/06/2023   Postsurgical hypothyroidism 11/04/2012   Multiple thyroid  nodules 12/19/2011    PCP: Domenick Loma, NP  REFERRING PROVIDER: Duwayne Purchase, MD  REFERRING DIAG: M17.11 (ICD-10-CM) - Unilateral primary osteoarthritis, right knee  THERAPY DIAG:  Chronic pain of right knee  Stiffness of right knee, not elsewhere classified  Muscle weakness (generalized)  Difficulty in walking, not elsewhere classified  Rationale for Evaluation and Treatment: Rehabilitation  ONSET DATE: 06/13/2024  SUBJECTIVE:   SUBJECTIVE STATEMENT: Patient reports no new issues.  Still having difficulty with descending steps with right LE if doing reciprocal gait.     PERTINENT HISTORY: na PAIN:  08/16/24 Are you having pain? Yes: NPRS scale: 0/10 right knee, 0/10 left knee,  3/10 at worst for both Pain  location: right knee Pain description: aching Aggravating factors: stairs, prolonged walking and standing, down inclines Relieving factors: rest, meds, ice  PRECAUTIONS: None  RED FLAGS: None   WEIGHT BEARING RESTRICTIONS: No  FALLS:  Has patient fallen in last 6 months? 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   NEXT MD VISIT: prn  OBJECTIVE:  Note: Objective measures were completed at Evaluation unless otherwise noted.  DIAGNOSTIC FINDINGS: xrays  PATIENT SURVEYS:  LEFS  Extreme difficulty/unable (0), Quite a bit of difficulty (1), Moderate difficulty (2), Little difficulty (3), No difficulty (4) Survey date:    Any of your usual work, housework or school activities 3  2. Usual hobbies, recreational or sporting activities 3  3. Getting into/out of the bath 3  4. Walking between rooms 4  5. Putting on socks/shoes 4  6. Squatting  3  7. Lifting an object, like  a bag of groceries from the floor 3  8. Performing light activities around your home 4  9. Performing heavy activities around your home 2  10. Getting into/out of a car 3  11. Walking 2 blocks 4  12. Walking 1 mile 4  13. Going up/down 10 stairs (1 flight) 1  14. Standing for 1 hour 2  15.  sitting for 1 hour 4  16. Running on even ground 0  17. Running on uneven ground 0  18. Making sharp turns while  running fast 0  19. Hopping  0  20. Rolling over in bed 4  Score total:  51/80     COGNITION: Overall cognitive status: Within functional limits for tasks assessed     SENSATION: WFL  POSTURE: Right knee valgus  PALPATION: Crepitus bilaterally (R > L) on seated knee flex/ext  LOWER EXTREMITY ROM:  All WFL with exception of right knee extension which is   LOWER EXTREMITY MMT:  MMT Right eval Left eval  Hip flexion    Hip extension    Hip abduction    Hip adduction    Hip internal rotation    Hip external rotation    Knee flexion    Knee extension    Ankle dorsiflexion    Ankle plantarflexion    Ankle inversion    Ankle eversion     (Blank rows = not tested)  LOWER EXTREMITY SPECIAL TESTS:  Knee special tests: Patellafemoral grind test: positive   FUNCTIONAL TESTS:  5 times sit to stand: 16.06 sec Timed up and go (TUG): 7.84 sec  GAIT: Distance walked: 30 feet Assistive device utilized: None Level of assistance: Complete Independence Comments: antalgic                                                                                                                              TREATMENT DATE:  08/16/24 Nustep L 5 x 5 min Step up on bottom step x 10 each (had pain in right knee but this was popliteal area) Switched to step up in front of mirror on 4 inch step for patient to visualize posture for verbal cues for correct alignment 2 x 10 both Seated clam with yellow loop x 20 Standing clam with yellow loop x 20 in semi squat position Lateral band walks with yellow loop 3 laps of 10 steps each way Sit to stand in front of mirror with 5 lb kb 2 x 10 with chest press Step downs 4 inch step  2x10 B Seated LAQ 2 x 10 each with 5 lb ankle weights Seated march x 20 with 5 lb ankle weights  Seated hip ER with 5 lb ankle weights 2 x 10 each LE  08/11/24 Nustep L 5 x 5 min Assessed descending stairs: pt turns and side steps down. Step downs 4 inch step  2x10  B Lateral band walks with red loop at ankles 2x 15 ft 3 way  step outs with red loop at ankles x 5 B Seated clam with yellow loop x 20 Sit to stand x 10 Sit to stand with 5# KB with chest press 2x5 Leg press B 85# 2 x 10, Unilateral 40# with slow eccentric 2 x 10 R only (L knee started hurting posteriorly with double leg) SL clam red loop 2 x 10   08/09/24 Nustep L 4 x 5 min Lateral band walks with yellow loop x 1 laps of 15 feet, then red loop at ankles 2x 15 ft 3 way step outs with red loop at ankles x 5 B Seated clam with yellow loop x 20 Sit to stand x 10 Squat to mat table x 10 Seated LAQ with 3# (increase next visit) 2 x 10 each LE Supine TKE over larger green noodle 2 x 10 with 3# both Supine SAQ over larger teal bolster 2 x 10 with 3# both (increase wt next visit) SLR with 3# 2 x 10 each LE Hook lying clam with red loop x 20 SL clam red loop 2 x 10   PATIENT EDUCATION:  Education details: Initiated HEP, educated on anatomy of the knee, valgus position of knee, importance of hip strength and proper alignment to avoid further deformity.  Person educated: Patient Education method: Explanation, Demonstration, Verbal cues, and Handouts Education comprehension: verbalized understanding, returned demonstration, and verbal cues required  HOME EXERCISE PROGRAM: Access Code: [email or web address] URL: [email or web address] Date: 07/15/2024 Prepared by: Orvil Beuhring  Exercises - Supine Quadricep Sets  - 1 x daily - 7 x weekly - 1 sets - 20 reps - Supine Knee Extension Strengthening  - 1 x daily - 7 x weekly - 1 sets - 20 reps - Seated Long Arc Quad  - 1 x daily - 7 x weekly - 2 sets - 10 reps - 5 hold - Seated Knee Extension Stretch with Chair  - 1 x daily - 7 x weekly - 1 sets - 1 reps - work up to 30 min hold - Bridge with Hip Abduction and Resistance  - 1 x daily - 7 x weekly - 2 sets - 10 reps - Supine Active Straight Leg Raise  - 1 x daily - 7 x weekly - 2 sets - 10  reps - Sidelying Hip Abduction  - 1 x daily - 7 x weekly - 2 sets - 10 reps - Clamshell with Resistance  - 1 x daily - 7 x weekly - 2 sets - 10 reps  ASSESSMENT:  CLINICAL IMPRESSION: Worked on step downs today initially as patient uses a side step to descend stairs. She demonstrates continued right knee valgus with step down, but able to correct with verbal cues. We did 4 inch step for step downs today and worked on hip strength to aid in alignment.  She is well motivated and compliant.  She would benefit from continuing skilled PT.    OBJECTIVE IMPAIRMENTS: Abnormal gait, decreased activity tolerance, decreased balance, decreased mobility, difficulty walking, decreased ROM, decreased strength, increased edema, increased fascial restrictions, increased muscle spasms, impaired flexibility, improper body mechanics, postural dysfunction, obesity, and pain.   ACTIVITY LIMITATIONS: carrying, lifting, bending, sitting, standing, squatting, sleeping, stairs, transfers, bed mobility, bathing, toileting, dressing, and caring for others  PARTICIPATION LIMITATIONS: meal prep, cleaning, laundry, driving, shopping, community activity, occupation, yard work, and church  PERSONAL FACTORS: Fitness, Past/current experiences, Profession, and 1-2 comorbidities: osteoporosis and anxiety are also affecting patient's functional outcome.   REHAB POTENTIAL: Good  CLINICAL DECISION  MAKING: Stable/uncomplicated  EVALUATION COMPLEXITY: Low   GOALS: Goals reviewed with patient? Yes  SHORT TERM GOALS: Target date: 07/28/2024   Patient will be independent with initial HEP  Baseline: Goal status: ONGOING 8/1  2.  Pain report to be no greater than 4/10  Baseline:  Goal status: MET 08/09/24   LONG TERM GOALS: Target date: 08/25/2024   Patient to be independent with advanced HEP  Baseline:  Goal status: INITIAL  2.  Patient to report pain no greater than 2/10  Baseline:  Goal status: INITIAL  3.   Functional scores to improve by 2-3 seconds Baseline:  Goal status: INITIAL  4.  LEFS score to improve to 55 Baseline:  Goal status: INITIAL  5.  Patient to report 85% improvement in overall symptoms  Baseline:  Goal status: INITIAL  6.  Patient to be able to ascend and descend steps without pain or no greater than 2/10  Baseline:  Goal status: INITIAL   PLAN:  PT FREQUENCY: 1-2x/week  PT DURATION: 8 weeks  PLANNED INTERVENTIONS: 97110-Therapeutic exercises, 97530- Therapeutic activity, 97112- Neuromuscular re-education, 97535- Self Care, 02859- Manual therapy, [phone]- Gait training, [phone]- Aquatic Therapy, [phone]- Electrical stimulation (unattended), [phone]- Electrical stimulation (manual), 97016- Vasopneumatic device, Patient/Family education, Balance training, Stair training, Taping, Joint mobilization, Spinal mobilization, Compression bandaging, DME instructions, Cryotherapy, and Moist heat  PLAN FOR NEXT SESSION: Nustep, progress HEP, quad rehab, hip strengthening  Trebor Galdamez B. Teyanna Thielman, PT 08/16/24 11:52 AM [facility] [address] [place], [place] [address] Phone # [phone] Fax [phone]

## 2024-08-19 ENCOUNTER — Ambulatory Visit: Admitting: Physical Therapy

## 2024-08-19 ENCOUNTER — Encounter: Payer: Self-pay | Admitting: Physical Therapy

## 2024-08-19 DIAGNOSIS — M25561 Pain in right knee: Secondary | ICD-10-CM | POA: Diagnosis not present

## 2024-08-19 DIAGNOSIS — M6281 Muscle weakness (generalized): Secondary | ICD-10-CM

## 2024-08-19 DIAGNOSIS — G8929 Other chronic pain: Secondary | ICD-10-CM

## 2024-08-19 DIAGNOSIS — M25661 Stiffness of right knee, not elsewhere classified: Secondary | ICD-10-CM

## 2024-08-19 NOTE — Therapy (Signed)
 OUTPATIENT PHYSICAL THERAPY LOWER EXTREMITY TREATMENT AND PROGRESS NOTE  Progress Note Reporting Period 06/30/24 to 08/19/24  See note below for Objective Data and Assessment of Progress/Goals.       Patient Name: Kimberly Ryan MRN: [id number] DOB:[birth year], 73 y.o., female Today's Date: 08/19/2024  END OF SESSION:  PT End of Session - 08/19/24 1149     Visit Number 10    Date for PT Re-Evaluation 08/25/24    Authorization Type [employer]    Progress Note Due on Visit 20    PT Start Time 1105    PT Stop Time 1145    PT Time Calculation (min) 40 min    Activity Tolerance Patient tolerated treatment well    Behavior During Therapy WFL for tasks assessed/performed                Past Medical History:  Diagnosis Date   Anxiety    Arthritis    GERD (gastroesophageal reflux disease)    Headache(784.0)    Shortness of breath    with exertion    Thyroid  disease    Past Surgical History:  Procedure Laterality Date   MYOMECTOMY  2000   RHINOPLASTY  1953   THYROID  LOBECTOMY  08/17/2012   Procedure: THYROID  LOBECTOMY;  Surgeon: Camellia CHRISTELLA Blush, MD,FACS;  Location: [facility];  Service: General;  Laterality: Left;  Left Thyroid  Lobectomy   TONSILLECTOMY  1954   Patient Active Problem List   Diagnosis Date Noted   OP (osteoporosis) 01/06/2023   Postsurgical hypothyroidism 11/04/2012   Multiple thyroid  nodules 12/19/2011    PCP: Domenick Loma, NP  REFERRING PROVIDER: Duwayne Purchase, MD  REFERRING DIAG: M17.11 (ICD-10-CM) - Unilateral primary osteoarthritis, right knee  THERAPY DIAG:  Chronic pain of right knee  Stiffness of right knee, not elsewhere classified  Muscle weakness (generalized)  Rationale for Evaluation and Treatment: Rehabilitation  ONSET DATE: 06/13/2024  SUBJECTIVE:   SUBJECTIVE STATEMENT:  My most challenging task is coming down hills or stairs.  Stepping down with Lt foot is hard on the Rt knee due to pain.  Overall I am  getting stronger since starting PT.  I can tell when I go for a walk I don't drag the Rt leg along as much and it feels like I'm more energetic.  Pain is anywhere from 0-4/10.  I only have pain when I'm doing the painful activity (stairs and going down a hill) then it resolves quickly.  PERTINENT HISTORY: na PAIN:  08/16/24 Are you having pain? Yes: NPRS scale: 0/10 right knee, 0/10 left knee,  4/10 at worst for both Pain location: right knee Pain description: aching Aggravating factors: stairs, prolonged walking and standing, down inclines Relieving factors: rest, meds, ice  PRECAUTIONS: None  RED FLAGS: None   WEIGHT BEARING RESTRICTIONS: No  FALLS:  Has patient fallen in last 6 months? 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   NEXT MD VISIT: prn  OBJECTIVE:  Note: Objective measures were completed at Evaluation unless otherwise noted.  DIAGNOSTIC FINDINGS: xrays  PATIENT SURVEYS:  LEFS  Extreme difficulty/unable (0), Quite a bit of difficulty (1), Moderate difficulty (2), Little difficulty (3), No difficulty (4) Survey date:  eval  Any of your usual work, housework or school activities 3  2. Usual hobbies, recreational or sporting activities 3  3. Getting into/out of the bath 3  4. Walking between rooms 4  5. Putting on socks/shoes 4  6. Squatting  3  7. Lifting an  object, like a bag of groceries from the floor 3  8. Performing light activities around your home 4  9. Performing heavy activities around your home 2  10. Getting into/out of a car 3  11. Walking 2 blocks 4  12. Walking 1 mile 4  13. Going up/down 10 stairs (1 flight) 1  14. Standing for 1 hour 2  15.  sitting for 1 hour 4  16. Running on even ground 0  17. Running on uneven ground 0  18. Making sharp turns while running fast 0  19. Hopping  0  20. Rolling over in bed 4  Score total:  51/80     COGNITION: Overall cognitive status: Within functional limits for tasks assessed     SENSATION: WFL  POSTURE: Right knee valgus  PALPATION: Crepitus bilaterally (R > L) on seated knee flex/ext  LOWER EXTREMITY ROM:  All WFL with exception of right knee extension which is   LOWER EXTREMITY MMT:  MMT Right eval Left eval  Hip flexion    Hip extension    Hip abduction    Hip adduction    Hip internal rotation    Hip external rotation    Knee flexion    Knee extension    Ankle dorsiflexion    Ankle plantarflexion    Ankle inversion    Ankle eversion     (Blank rows = not tested)  LOWER EXTREMITY SPECIAL TESTS:  Knee special tests: Patellafemoral grind test: positive   FUNCTIONAL TESTS:  08/19/24: Sit to stand and stand to sit: good knee alignment maintained  Step down: slight knee valgus on Rt from 4 step with pain  Eval: 5 times sit to stand: 16.06 sec Timed up and go (TUG): 7.84 sec  GAIT: Distance walked: 30 feet Assistive device utilized: None Level of assistance: Complete Independence Comments: antalgic                                                                                                                              TREATMENT DATE:  08/19/24 Step ups 6 x10 leading with left, x 10 leading with right Sit to stand in front of mirror for alignment biofeedback of knees, with 5lb chest press 2x10 LAQ 2x10 5lb bil LE Seated march  2x 20 with 5 lb  ankle weights  Seated hip ER with 5 lb ankle weights 2 x 10 each  LE Seated clam with yellow loop x 20 Lateral band walks with yellow loop 3 laps of 10 steps each way Step downs 4 inch step  2x4 B - with mirror for Rt knee valgus control feedback SL hip abd 2x10 each LE SL clam red loop 2x10 each LE SLS with unlocked knee 3-way taps x 5 rounds each LE  08/16/24 Nustep L 5 x 5 min Step up on bottom step x 10 each (had pain in right knee but this was popliteal area) Switched to step up in front of mirror on 4 inch step for patient to visualize posture for verbal cues for correct alignment 2 x 10 both Seated clam with yellow loop x 20 Standing clam with yellow loop x 20 in semi squat position Lateral band walks with yellow loop 3 laps of 10 steps each way Sit to stand in front of mirror with 5 lb kb 2 x 10 with chest press Step downs 4 inch step  2x10 B Seated LAQ 2 x 10 each with 5 lb ankle weights Seated march x 20 with 5 lb ankle weights  Seated hip ER with 5 lb ankle weights 2 x 10 each LE  08/11/24 Nustep L 5 x 5 min Assessed descending stairs: pt turns and side steps down. Step downs 4 inch step  2x10 B Lateral band walks with red loop at ankles 2x 15 ft 3 way step outs with red loop at ankles x 5 B Seated clam with yellow loop x 20 Sit to stand x 10 Sit to stand with 5# KB with chest press 2x5 Leg press B 85# 2 x 10, Unilateral 40# with slow eccentric 2 x 10 R only (L knee started hurting posteriorly with double leg) SL clam red loop 2 x 10    PATIENT EDUCATION:  Education details: Initiated HEP, educated on anatomy of the knee, valgus position of knee, importance of hip strength and proper alignment to avoid further deformity.  Person educated: Patient Education method: Explanation, Demonstration, Verbal cues, and Handouts Education comprehension: verbalized understanding, returned demonstration, and verbal cues required  HOME EXERCISE PROGRAM: Access Code: [email or web address] URL:  [email or web address] Date: 07/15/2024 Prepared by: Orvil Zaiyah Sottile  Exercises - Supine Quadricep Sets  - 1 x daily - 7 x weekly - 1 sets - 20 reps - Supine Knee Extension Strengthening  - 1 x daily - 7 x weekly - 1 sets - 20 reps - Seated Long Arc Quad  - 1 x daily - 7 x weekly - 2 sets - 10 reps - 5 hold - Seated Knee Extension Stretch with Chair  - 1 x daily - 7 x weekly - 1 sets - 1 reps - work up to 30 min hold - Bridge with Hip Abduction and Resistance  - 1 x daily - 7 x weekly - 2 sets - 10 reps - Supine Active Straight Leg Raise  - 1 x daily - 7 x weekly - 2 sets - 10 reps - Sidelying Hip Abduction  - 1 x daily - 7 x weekly - 2 sets - 10 reps - Clamshell with Resistance  - 1 x daily - 7 x weekly - 2 sets - 10 reps  ASSESSMENT:  CLINICAL IMPRESSION: Pt with excellent knee alignment with loaded sit to stands and stand to sits.  She does demo slight valgus on Rt with Lt step downs and some mild pain with this.  Descending stairs and hills are her most challenging tasks secondary to pain.  We did 4 inch step for step downs today and worked on hip strength to aid in alignment.  She is well motivated and compliant.  She will likely be ready to d/c after next week at end of plan of care.  OBJECTIVE IMPAIRMENTS: Abnormal gait, decreased activity tolerance, decreased balance, decreased mobility, difficulty walking, decreased ROM, decreased strength, increased edema, increased fascial restrictions, increased muscle spasms, impaired flexibility, improper body mechanics, postural dysfunction, obesity, and pain.   ACTIVITY LIMITATIONS: carrying, lifting, bending, sitting, standing, squatting, sleeping, stairs, transfers, bed mobility, bathing, toileting, dressing, and caring for others  PARTICIPATION LIMITATIONS: meal prep, cleaning, laundry, driving, shopping, community activity, occupation, yard work, and church  PERSONAL FACTORS: Fitness, Past/current experiences, Profession,  and 1-2 comorbidities: osteoporosis and anxiety are also affecting patient's functional outcome.   REHAB POTENTIAL: Good  CLINICAL DECISION MAKING: Stable/uncomplicated  EVALUATION COMPLEXITY: Low   GOALS: Goals reviewed with patient? Yes  SHORT TERM GOALS: Target date: 07/28/2024   Patient will be independent with initial HEP  Baseline: Goal status: ONGOING 8/1  2.  Pain report to be no greater than 4/10  Baseline:  Goal status: MET 08/09/24   LONG TERM GOALS: Target date: 08/25/2024   Patient to be independent with advanced HEP  Baseline:  Goal status: INITIAL  2.  Patient to report pain no greater than 2/10  Baseline:  Goal status: ONGOING - reaches 4/10 briefly when going down stairs or a hill  3.  Functional scores to improve by 2-3 seconds Baseline:  Goal status: INITIAL  4.  LEFS score to improve to 55 Baseline:  Goal status: INITIAL  5.  Patient to report 85% improvement in overall symptoms  Baseline:  Goal status: MET 08/19/24  6.  Patient to be able to ascend and descend steps without pain or no greater than 2/10  Baseline:  Goal status: ONGOING, MET FOR ASCENDING BUT NOT DESCENDING 08/19/24   PLAN:  PT FREQUENCY: 1-2x/week  PT DURATION: 8 weeks  PLANNED INTERVENTIONS: 97110-Therapeutic exercises, 97530- Therapeutic activity, 97112- Neuromuscular re-education, 97535- Self Care, 02859- Manual therapy, [phone]- Gait training, [phone]- Aquatic Therapy, [phone]- Electrical stimulation (unattended), [phone]- Electrical stimulation (manual), [id number]- Vasopneumatic device, Patient/Family education, Balance training, Stair training, Taping, Joint mobilization, Spinal mobilization, Compression bandaging, DME instructions, Cryotherapy, and Moist heat  PLAN FOR NEXT SESSION: begin discharge planning, retest 5x STS and TUG, continue Rt hip strength, eccentric quad strength, functional step down training, Nustep, progress HEP, quad rehab, hip strengthening  Orvil Fester,  PT 08/19/24 11:53 AM   [facility] [address], [address] [place], [place] [address] Phone # [phone] Fax [phone]

## 2024-08-22 ENCOUNTER — Ambulatory Visit

## 2024-08-22 DIAGNOSIS — E559 Vitamin D deficiency, unspecified: Secondary | ICD-10-CM | POA: Diagnosis not present

## 2024-08-22 DIAGNOSIS — M810 Age-related osteoporosis without current pathological fracture: Secondary | ICD-10-CM | POA: Diagnosis not present

## 2024-08-22 DIAGNOSIS — E890 Postprocedural hypothyroidism: Secondary | ICD-10-CM | POA: Diagnosis not present

## 2024-08-23 ENCOUNTER — Ambulatory Visit

## 2024-08-23 DIAGNOSIS — R262 Difficulty in walking, not elsewhere classified: Secondary | ICD-10-CM

## 2024-08-23 DIAGNOSIS — M25661 Stiffness of right knee, not elsewhere classified: Secondary | ICD-10-CM

## 2024-08-23 DIAGNOSIS — R252 Cramp and spasm: Secondary | ICD-10-CM

## 2024-08-23 DIAGNOSIS — G8929 Other chronic pain: Secondary | ICD-10-CM

## 2024-08-23 DIAGNOSIS — M25561 Pain in right knee: Secondary | ICD-10-CM | POA: Diagnosis not present

## 2024-08-23 DIAGNOSIS — M6281 Muscle weakness (generalized): Secondary | ICD-10-CM

## 2024-08-23 NOTE — Therapy (Signed)
 OUTPATIENT PHYSICAL THERAPY LOWER EXTREMITY TREATMENT AND PROGRESS NOTE  Progress Note Reporting Period 06/30/24 to 08/19/24  See note below for Objective Data and Assessment of Progress/Goals.       Patient Name: Kimberly Ryan MRN: [id number] DOB:Oct 06, 1951, 73 y.o., female Today's Date: 08/23/2024  END OF SESSION:  PT End of Session - 08/23/24 1106     Visit Number 11    Date for PT Re-Evaluation 08/25/24    Authorization Type [employer]    Progress Note Due on Visit 20    PT Start Time 1104    PT Stop Time 1145    PT Time Calculation (min) 41 min    Activity Tolerance Patient tolerated treatment well    Behavior During Therapy WFL for tasks assessed/performed                Past Medical History:  Diagnosis Date   Anxiety    Arthritis    GERD (gastroesophageal reflux disease)    Headache(784.0)    Shortness of breath    with exertion    Thyroid  disease    Past Surgical History:  Procedure Laterality Date   MYOMECTOMY  2000   RHINOPLASTY  1953   THYROID  LOBECTOMY  08/17/2012   Procedure: THYROID  LOBECTOMY;  Surgeon: Camellia CHRISTELLA Blush, MD,FACS;  Location: [facility];  Service: General;  Laterality: Left;  Left Thyroid  Lobectomy   TONSILLECTOMY  1954   Patient Active Problem List   Diagnosis Date Noted   OP (osteoporosis) 01/06/2023   Postsurgical hypothyroidism 11/04/2012   Multiple thyroid  nodules 12/19/2011    PCP: Domenick Loma, NP  REFERRING PROVIDER: Duwayne Purchase, MD  REFERRING DIAG: M17.11 (ICD-10-CM) - Unilateral primary osteoarthritis, right knee  THERAPY DIAG:  Stiffness of right knee, not elsewhere classified  Chronic pain of right knee  Muscle weakness (generalized)  Difficulty in walking, not elsewhere classified  Cramp and spasm  Rationale for Evaluation and Treatment: Rehabilitation  ONSET DATE: 06/13/2024  SUBJECTIVE:   SUBJECTIVE STATEMENT: I'm able to go up and down steps with alternating feet now  I am  50% better since starting PT.  I can tell when I go for a walk I don't drag the Rt leg along as much and it feels like I'm more energetic.  Pain is anywhere from 0-4/10.  I only have pain when I'm doing the painful activity (stairs and going down a hill) then it resolves quickly.  PERTINENT HISTORY: na PAIN:  08/23/24 Are you having pain? Yes: NPRS scale: 0/10 right knee, 0/10 left knee,  4/10 at worst for both Pain location: right knee Pain description: aching Aggravating factors: stairs, prolonged walking and standing, down inclines Relieving factors: rest, meds, ice  PRECAUTIONS: None  RED FLAGS: None   WEIGHT BEARING RESTRICTIONS: No  FALLS:  Has patient fallen in last 6 months? 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   NEXT MD VISIT: prn  OBJECTIVE:  Note: Objective  measures were completed at Evaluation unless otherwise noted.  DIAGNOSTIC FINDINGS: xrays  PATIENT SURVEYS:  LEFS  Extreme difficulty/unable (0), Quite a bit of difficulty (1), Moderate difficulty (2), Little difficulty (3), No difficulty (4) Survey date:  eval 08/23/24  Any of your usual work, housework or school activities 3 4  2. Usual hobbies, recreational or sporting activities 3 4  3. Getting into/out of the bath 3 3  4. Walking between rooms 4 4  5. Putting on socks/shoes 4 4  6. Squatting  3 3   7. Lifting an object, like a bag of groceries from the floor 3 3  8. Performing light activities around your home 4 4  9. Performing heavy activities around your home 2 2  10. Getting into/out of a car 3 4  11. Walking 2 blocks 4 4  12. Walking 1 mile 4 4  13. Going up/down 10 stairs (1 flight) 1 3  14. Standing for 1 hour 2 2  15.  sitting for 1 hour 4 4  16. Running on even ground 0 4  17. Running on uneven ground 0 1  18. Making sharp turns while running fast 0 1  19. Hopping  0 1  20. Rolling over in bed 4 4  Score total:  51/80 60/80     COGNITION: Overall cognitive status: Within functional limits for tasks assessed     SENSATION: WFL  POSTURE: Right knee valgus  PALPATION: Crepitus bilaterally (R > L) on seated knee flex/ext  LOWER EXTREMITY ROM:  All WFL with exception of right knee extension which is   LOWER EXTREMITY MMT:  08/23/24 Generally 4/5 throughout bilateral LE's: hip abd and ER 4-/5 bilaterally  LOWER EXTREMITY SPECIAL TESTS:  Knee special tests: Patellafemoral grind test: positive   FUNCTIONAL TESTS:  08/19/24: Sit to stand and stand to sit: good knee alignment maintained  Step down: slight knee valgus on Rt from 4 step with pain  Eval: 5 times sit to stand: 16.06 sec Timed up and go (TUG): 7.84 sec  08/23/24: 5 times sit to stand: 13.30 sec Timed up and go (TUG): 7.23 sec  GAIT: Distance walked: 30 feet Assistive device utilized: None Level of assistance: Complete Independence Comments: antalgic                                                                                                                              TREATMENT DATE:  08/23/24 Nustep x 5 min level 4 (PT present to discuss status and progress toward goals, DC planning) Re-tested for upcoming DC/ discussed DC planning Seated clamshell with yellow loop 2 x 10 Lateral band walks with yellow loop 3 laps of 10 steps each way Step ups 6 x10 leading with left, x 10 leading  with right with mirror for postural feedback  Lateral step ups 6 inch step x 10 each LE Step downs 4 inch step  x 10 B - with mirror  for Rt knee valgus control feedback (needed min UE support) Cone touches with cone on adjustable table 3 x 10 each LE (started with table all the way up then lowered approx 2 levels for more challenge)  08/19/24 Step ups 6 x10 leading with left, x 10 leading with right Sit to stand in front of mirror for alignment biofeedback of knees, with 5lb chest press 2x10 LAQ 2x10 5lb bil LE Seated march  2x 20 with 5 lb ankle weights  Seated hip ER with 5 lb ankle weights 2 x 10 each LE Seated clam with yellow loop x 20 Lateral band walks with yellow loop 3 laps of 10 steps each way Step downs 4 inch step  2x4 B - with mirror for Rt knee valgus control feedback SL hip abd 2x10 each LE SL clam red loop 2x10 each LE SLS with unlocked knee 3-way taps x 5 rounds each LE  08/16/24 Nustep L 5 x 5 min Step up on bottom step x 10 each (had pain in right knee but this was popliteal area) Switched to step up in front of mirror on 4 inch step for patient to visualize posture for verbal cues for correct alignment 2 x 10 both Seated clam with yellow loop x 20 Standing clam with yellow loop x 20 in semi squat position Lateral band walks with yellow loop 3 laps of 10 steps each way Sit to stand in front of mirror with 5 lb kb 2 x 10 with chest press Step downs 4 inch step  2x10 B Seated LAQ 2 x 10 each with 5 lb ankle weights Seated march x 20 with 5 lb ankle weights  Seated hip ER with 5 lb ankle weights 2 x 10 each LE  08/11/24 Nustep L 5 x 5 min Assessed descending stairs: pt turns and side steps down. Step downs 4 inch step  2x10 B Lateral band walks with red loop at ankles 2x 15 ft 3 way step outs with red loop at ankles x 5 B Seated clam with yellow loop x 20 Sit to stand x 10 Sit to stand with 5# KB with chest press 2x5 Leg press B 85# 2 x 10, Unilateral 40# with slow  eccentric 2 x 10 R only (L knee started hurting posteriorly with double leg) SL clam red loop 2 x 10    PATIENT EDUCATION:  Education details: Initiated HEP, educated on anatomy of the knee, valgus position of knee, importance of hip strength and proper alignment to avoid further deformity.  Person educated: Patient Education method: Explanation, Demonstration, Verbal cues, and Handouts Education comprehension: verbalized understanding, returned demonstration, and verbal cues required  HOME EXERCISE PROGRAM: Access Code: [email or web address] URL: [email or web address] Date: 07/15/2024 Prepared by: Orvil Beuhring  Exercises - Supine Quadricep Sets  - 1 x daily - 7 x weekly - 1 sets - 20 reps - Supine Knee Extension Strengthening  - 1 x daily - 7 x weekly - 1 sets - 20 reps - Seated Long Arc Quad  - 1 x daily - 7 x weekly - 2 sets - 10 reps - 5 hold - Seated Knee Extension Stretch with Chair  - 1 x daily - 7 x weekly - 1 sets - 1 reps - work up to 30 min hold - Bridge with Hip Abduction and Resistance  - 1 x daily - 7 x weekly - 2 sets - 10 reps - Supine Active Straight Leg Raise  - 1 x daily - 7  x weekly - 2 sets - 10 reps - Sidelying Hip Abduction  - 1 x daily - 7 x weekly - 2 sets - 10 reps - Clamshell with Resistance  - 1 x daily - 7 x weekly - 2 sets - 10 reps  ASSESSMENT:  CLINICAL IMPRESSION: Kamaree is progressing appropriately.  She scores higher on all objective findings but is still struggling with hip strength, especially on right.  She needs mirror and verbal cues for feedback to avoid knee valgus on squats and step ups.  She is very compliant and well motivated. She has one visit left.  She may benefit from a few more visits (4 weeks) of skilled PT to insure that she is able to follow HEP with correct technique and progress appropriately.  However, she mentioned that she may want to work on her own for a while then return if she is not able to progress on her own.   She  will let PT know next visit about what she would like to do.    OBJECTIVE IMPAIRMENTS: Abnormal gait, decreased activity tolerance, decreased balance, decreased mobility, difficulty walking, decreased ROM, decreased strength, increased edema, increased fascial restrictions, increased muscle spasms, impaired flexibility, improper body mechanics, postural dysfunction, obesity, and pain.   ACTIVITY LIMITATIONS: carrying, lifting, bending, sitting, standing, squatting, sleeping, stairs, transfers, bed mobility, bathing, toileting, dressing, and caring for others  PARTICIPATION LIMITATIONS: meal prep, cleaning, laundry, driving, shopping, community activity, occupation, yard work, and church  PERSONAL FACTORS: Fitness, Past/current experiences, Profession, and 1-2 comorbidities: osteoporosis and anxiety are also affecting patient's functional outcome.   REHAB POTENTIAL: Good  CLINICAL DECISION MAKING: Stable/uncomplicated  EVALUATION COMPLEXITY: Low   GOALS: Goals reviewed with patient? Yes  SHORT TERM GOALS: Target date: 07/28/2024   Patient will be independent with initial HEP  Baseline: Goal status: ONGOING 8/1  2.  Pain report to be no greater than 4/10  Baseline:  Goal status: MET 08/09/24   LONG TERM GOALS: Target date: 08/25/2024   Patient to be independent with advanced HEP  Baseline:  Goal status: In progress  2.  Patient to report pain no greater than 2/10  Baseline:  Goal status: ONGOING - reaches 4/10 briefly when going down stairs or a hill  3.  Functional scores to improve by 2-3 seconds Baseline:  Goal status: MET 08/23/24  4.  LEFS score to improve to 55 Baseline:  Goal status: MET 08/23/24  5.  Patient to report 85% improvement in overall symptoms  Baseline:  Goal status: MET 08/19/24 (however, today she says 50% better)  6.  Patient to be able to ascend and descend steps without pain or no greater than 2/10  Baseline:  Goal status: MET  08/23/24   PLAN:  PT FREQUENCY: 1-2x/week  PT DURATION: 8 weeks  PLANNED INTERVENTIONS: 97110-Therapeutic exercises, 97530- Therapeutic activity, 97112- Neuromuscular re-education, 97535- Self Care, [id number]- Manual therapy, [phone]- Gait training, [phone]- Aquatic Therapy, [phone]- Electrical stimulation (unattended), [phone]- Electrical stimulation (manual), [id number]- Vasopneumatic device, Patient/Family education, Balance training, Stair training, Taping, Joint mobilization, Spinal mobilization, Compression bandaging, DME instructions, Cryotherapy, and Moist heat  PLAN FOR NEXT SESSION: Decide on DC.   Assessment completed but patient may benefit from continuing.  She mentioned she may want to continue on her own.  She will think about this and confirm next visit.  DC if patient desires or do recert for approx 4 weeks if she is able to continue.   Rt hip strength, eccentric quad strength, functional  step down training, Nustep, progress HEP, quad rehab, hip strengthening  Georgean Spainhower B. Damaria Stofko, PT 08/23/24 12:00 PM [facility] [address] [place], [place] [address] Phone # [phone] Fax [phone]

## 2024-08-25 ENCOUNTER — Ambulatory Visit: Admitting: Physical Therapy

## 2024-08-25 ENCOUNTER — Encounter: Payer: Self-pay | Admitting: Physical Therapy

## 2024-08-25 DIAGNOSIS — M6281 Muscle weakness (generalized): Secondary | ICD-10-CM

## 2024-08-25 DIAGNOSIS — R262 Difficulty in walking, not elsewhere classified: Secondary | ICD-10-CM

## 2024-08-25 DIAGNOSIS — G8929 Other chronic pain: Secondary | ICD-10-CM

## 2024-08-25 DIAGNOSIS — M25661 Stiffness of right knee, not elsewhere classified: Secondary | ICD-10-CM

## 2024-08-25 DIAGNOSIS — R252 Cramp and spasm: Secondary | ICD-10-CM

## 2024-08-25 DIAGNOSIS — M25561 Pain in right knee: Secondary | ICD-10-CM | POA: Diagnosis not present

## 2024-08-25 NOTE — Therapy (Signed)
 OUTPATIENT PHYSICAL THERAPY LOWER EXTREMITY TREATMENT /RE-CERTIFICATION      Patient Name: Kimberly Ryan MRN: [id number] DOB:27-Mar-1951, 73 y.o., female Today's Date: 08/25/2024  END OF SESSION:  PT End of Session - 08/25/24 1026     Visit Number 12    Date for PT Re-Evaluation 09/23/24    Authorization Type [employer]    Progress Note Due on Visit 20    PT Start Time 0934    PT Stop Time 1014    PT Time Calculation (min) 40 min    Activity Tolerance Patient tolerated treatment well    Behavior During Therapy WFL for tasks assessed/performed                 Past Medical History:  Diagnosis Date   Anxiety    Arthritis    GERD (gastroesophageal reflux disease)    Headache(784.0)    Shortness of breath    with exertion    Thyroid  disease    Past Surgical History:  Procedure Laterality Date   MYOMECTOMY  2000   RHINOPLASTY  1953   THYROID  LOBECTOMY  08/17/2012   Procedure: THYROID  LOBECTOMY;  Surgeon: Kimberly CHRISTELLA Blush, MD,FACS;  Location: [facility];  Service: General;  Laterality: Left;  Left Thyroid  Lobectomy   TONSILLECTOMY  1954   Patient Active Problem List   Diagnosis Date Noted   OP (osteoporosis) 01/06/2023   Postsurgical hypothyroidism 11/04/2012   Multiple thyroid  nodules 12/19/2011    PCP: Kimberly Loma, NP  REFERRING PROVIDER: Duwayne Purchase, MD  REFERRING DIAG: M17.11 (ICD-10-CM) - Unilateral primary osteoarthritis, right knee  THERAPY DIAG:  Stiffness of right knee, not elsewhere classified  Chronic pain of right knee  Muscle weakness (generalized)  Difficulty in walking, not elsewhere classified  Cramp and spasm  Rationale for Evaluation and Treatment: Rehabilitation  ONSET DATE: 06/13/2024  SUBJECTIVE:   SUBJECTIVE STATEMENT: Patient reports she is doing good today. She is not currently having pain.   PERTINENT HISTORY: na PAIN:  08/25/24 Are you having pain? Yes: NPRS scale: 0/10 right knee, 0/10 left knee,   4/10 at worst for both Pain location: right knee Pain description: aching Aggravating factors: stairs, prolonged walking and standing, down inclines Relieving factors: rest, meds, ice  PRECAUTIONS: None  RED FLAGS: None   WEIGHT BEARING RESTRICTIONS: No  FALLS:  Has patient fallen in last 6 months? 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   NEXT MD VISIT: prn  OBJECTIVE:  Note: Objective measures were completed at Evaluation unless otherwise noted.  DIAGNOSTIC FINDINGS: xrays  PATIENT SURVEYS:  LEFS  Extreme difficulty/unable (0), Quite a bit of difficulty (1), Moderate difficulty (2), Little difficulty (3), No difficulty (4) Survey date:  eval 08/23/24  Any of your usual work, housework or school activities 3 4  2. Usual hobbies, recreational or sporting activities 3 4  3. Getting into/out of the bath 3 3  4. Walking between rooms 4 4  5. Putting on socks/shoes 4 4  6. Squatting  3 3  7. Lifting an object, like a bag of groceries from the floor 3 3  8. Performing light activities around your home 4 4  9. Performing heavy activities around your home 2 2  10. Getting into/out of a car 3 4  11. Walking 2 blocks 4 4  12. Walking 1 mile 4 4  13. Going up/down 10 stairs (1 flight) 1 3  14. Standing for 1 hour 2 2  15.  sitting for 1 hour 4 4  16. Running on even ground 0  4  17. Running on uneven ground 0 1  18. Making sharp turns while running fast 0 1  19. Hopping  0 1  20. Rolling over in bed 4 4  Score total:  51/80 60/80     COGNITION: Overall cognitive status: Within functional limits for tasks assessed     SENSATION: WFL  POSTURE: Right knee valgus  PALPATION: Crepitus bilaterally (R > L) on seated knee flex/ext  LOWER EXTREMITY ROM:  All WFL with exception of right knee extension which is   LOWER EXTREMITY MMT:  08/23/24 Generally 4/5 throughout bilateral LE's: hip abd and ER 4-/5 bilaterally  LOWER EXTREMITY SPECIAL TESTS:  Knee special tests: Patellafemoral grind test: positive   FUNCTIONAL TESTS:  08/19/24: Sit to stand and stand to sit: good knee alignment maintained  Step down: slight knee valgus on Rt from 4 step with pain  Eval: 5 times sit to stand: 16.06 sec Timed up and go (TUG): 7.84 sec  08/23/24: 5 times sit to stand: 13.30 sec Timed up and go (TUG): 7.23 sec  GAIT: Distance walked: 30 feet Assistive device utilized: None Level of assistance: Complete Independence Comments: antalgic                                                                                                                              TREATMENT DATE:  08/25/24 Nustep x 6 min level 4 (PT present to discuss status and progress toward goals, DC planning) Seated clamshell with red loop 2 x 10 Lateral band walks with red loop 3 laps of 10 steps each way Step ups 6 x10 leading with left, x 10 leading with right  Lateral step ups 6 inch step x 10 each LE Iso hip abduction with purple ball x 10 5 sec hold Step downs 4 inch step  x 10 B - with mirror for Rt knee valgus control feedback (needed min UE support) Hooklying clam with red loop 2 x 10 SL clam with red loop 2 x 10 bilateral  LAQ 2x10 5lb bil LE    08/23/24 Nustep x 5 min level 4 (PT present to discuss status and progress toward goals, DC planning) Re-tested for upcoming DC/ discussed  DC planning Seated clamshell with yellow loop 2 x 10 Lateral band walks with yellow loop 3 laps of 10 steps each  way Step ups 6 x10 leading with left, x 10 leading with right with mirror for postural feedback  Lateral step ups 6 inch step x 10 each LE Step downs 4 inch step  x 10 B - with mirror for Rt knee valgus control feedback (needed min UE support) Cone touches with cone on adjustable table 3 x 10 each LE (started with table all the way up then lowered approx 2 levels for more challenge)  08/19/24 Step ups 6 x10 leading with left, x 10 leading with right Sit to stand in front of mirror for alignment biofeedback of knees, with 5lb chest press 2x10 LAQ 2x10 5lb bil LE Seated march  2x 20 with 5 lb ankle weights  Seated hip ER with 5 lb ankle weights 2 x 10 each LE Seated clam with yellow loop x 20 Lateral band walks with yellow loop 3 laps of 10 steps each way Step downs 4 inch step  2x4 B - with mirror for Rt knee valgus control feedback SL hip abd 2x10 each LE SL clam red loop 2x10 each LE SLS with unlocked knee 3-way taps x 5 rounds each LE    PATIENT EDUCATION:  Education details: Initiated HEP, educated on anatomy of the knee, valgus position of knee, importance of hip strength and proper alignment to avoid further deformity.  Person educated: Patient Education method: Explanation, Demonstration, Verbal cues, and Handouts Education comprehension: verbalized understanding, returned demonstration, and verbal cues required  HOME EXERCISE PROGRAM: Access Code: [email or web address] URL: [email or web address] Date: 07/15/2024 Prepared by: Orvil Beuhring  Exercises - Supine Quadricep Sets  - 1 x daily - 7 x weekly - 1 sets - 20 reps - Supine Knee Extension Strengthening  - 1 x daily - 7 x weekly - 1 sets - 20 reps - Seated Long Arc Quad  - 1 x daily - 7 x weekly - 2 sets - 10 reps - 5 hold - Seated Knee Extension Stretch with Chair  - 1 x daily - 7 x weekly - 1 sets - 1  reps - work up to 30 min hold - Bridge with Hip Abduction and Resistance  - 1 x daily - 7 x weekly - 2 sets - 10 reps - Supine Active Straight Leg Raise  - 1 x daily - 7 x weekly - 2 sets - 10 reps - Sidelying Hip Abduction  - 1 x daily - 7 x weekly - 2 sets - 10 reps - Clamshell with Resistance  - 1 x daily - 7 x weekly - 2 sets - 10 reps  ASSESSMENT:  CLINICAL IMPRESSION: Shabria verbalized feeling 50% better since starting therapy. She is making good progress but has not maximized her rehab potential. Since starting therapy she is able to walk 30 mins with no difficulty and she feels stronger. Ascending and descending stairs is still a challenge for her especially on her right side. She feels she babys her right side more. She needs mirror and verbal cues for feedback to avoid knee valgus on squats and step ups.  She is very compliant and well motivated. Patient will benefit from skilled PT to address the below impairments and improve overall function.   OBJECTIVE IMPAIRMENTS: Abnormal gait, decreased activity tolerance, decreased balance, decreased mobility, difficulty walking, decreased ROM, decreased strength, increased edema, increased fascial restrictions, increased muscle spasms, impaired flexibility, improper body mechanics, postural dysfunction, obesity, and pain.   ACTIVITY LIMITATIONS: carrying, lifting, bending, sitting, standing, squatting, sleeping, stairs, transfers, bed mobility, bathing,  toileting, dressing, and caring for others  PARTICIPATION LIMITATIONS: meal prep, cleaning, laundry, driving, shopping, community activity, occupation, yard work, and church  PERSONAL FACTORS: Fitness, Past/current experiences, Profession, and 1-2 comorbidities: osteoporosis and anxiety are also affecting patient's functional outcome.   REHAB POTENTIAL: Good  CLINICAL DECISION MAKING: Stable/uncomplicated  EVALUATION COMPLEXITY: Low   GOALS: Goals reviewed with patient? Yes  SHORT  TERM GOALS: Target date: 07/28/2024   Patient will be independent with initial HEP  Baseline: Goal status:MET  8/1  2.  Pain report to be no greater than 4/10  Baseline:  Goal status: MET 08/09/24   LONG TERM GOALS: Target date: 09/23/2024   Patient to be independent with advanced HEP  Baseline:  Goal status: In progress 08/25/2024  2.  Patient to report pain no greater than 2/10  Baseline:  Goal status: ONGOING - reaches 4/10 briefly when going down stairs or a hill 08/25/2024  3.  Functional scores to improve by 2-3 seconds Baseline:  Goal status: MET 08/23/24  4.  LEFS score to improve to 55 Baseline:  Goal status: MET 08/23/24  5.  Patient to report 85% improvement in overall symptoms  Baseline:  Goal status: MET 08/19/24 (however, today she says 50% better)  6.  Patient to be able to ascend and descend steps without pain or no greater than 2/10  Baseline:  Goal status: IN PROGRESS 08/25/2024  6.  Patient will complete 5STS in < or = to 11 sec due to improve LE strength. Baseline: 13.30 sec Goal status: NEW    PLAN:  PT FREQUENCY: 1-2x/week  PT DURATION: 4 weeks  PLANNED INTERVENTIONS: 97110-Therapeutic exercises, 97530- Therapeutic activity, 97112- Neuromuscular re-education, [phone]- Self Care, 02859- Manual therapy, [phone]- Gait training, [phone]- Aquatic Therapy, [phone]- Electrical stimulation (unattended), [phone]- Electrical stimulation (manual), 97016- Vasopneumatic device, Patient/Family education, Balance training, Stair training, Taping, Joint mobilization, Spinal mobilization, Compression bandaging, DME instructions, Cryotherapy, and Moist heat  PLAN FOR NEXT SESSION:  Rt hip strength, eccentric quad strength, functional step down training, Nustep, progress HEP, quad rehab, hip strengthening  Kristeen Sar, PT 08/25/24 10:32 AM [facility] [address] [place], [place] [address] Phone # [phone] Fax [phone]

## 2024-08-26 ENCOUNTER — Encounter: Admitting: Physical Therapy

## 2024-08-31 ENCOUNTER — Ambulatory Visit: Admitting: Rehabilitation

## 2024-08-31 ENCOUNTER — Encounter: Payer: Self-pay | Admitting: Rehabilitation

## 2024-08-31 DIAGNOSIS — M25561 Pain in right knee: Secondary | ICD-10-CM | POA: Diagnosis not present

## 2024-08-31 DIAGNOSIS — R262 Difficulty in walking, not elsewhere classified: Secondary | ICD-10-CM

## 2024-08-31 DIAGNOSIS — G8929 Other chronic pain: Secondary | ICD-10-CM

## 2024-08-31 DIAGNOSIS — M6281 Muscle weakness (generalized): Secondary | ICD-10-CM

## 2024-08-31 DIAGNOSIS — M25661 Stiffness of right knee, not elsewhere classified: Secondary | ICD-10-CM

## 2024-08-31 NOTE — Therapy (Signed)
 OUTPATIENT PHYSICAL THERAPY LOWER EXTREMITY TREATMENT      Patient Name: Kimberly Ryan MRN: [id number] DOB:January 08, 1951, 73 y.o., female Today's Date: 08/31/2024  END OF SESSION:  PT End of Session - 08/31/24 1158     Visit Number 13    Date for PT Re-Evaluation 09/23/24    PT Start Time 1106    PT Stop Time 1147    PT Time Calculation (min) 41 min    Activity Tolerance Patient tolerated treatment well    Behavior During Therapy WFL for tasks assessed/performed                  Past Medical History:  Diagnosis Date   Anxiety    Arthritis    GERD (gastroesophageal reflux disease)    Headache(784.0)    Shortness of breath    with exertion    Thyroid  disease    Past Surgical History:  Procedure Laterality Date   MYOMECTOMY  2000   RHINOPLASTY  1953   THYROID  LOBECTOMY  08/17/2012   Procedure: THYROID  LOBECTOMY;  Surgeon: Camellia CHRISTELLA Blush, MD,FACS;  Location: [facility];  Service: General;  Laterality: Left;  Left Thyroid  Lobectomy   TONSILLECTOMY  1954   Patient Active Problem List   Diagnosis Date Noted   OP (osteoporosis) 01/06/2023   Postsurgical hypothyroidism 11/04/2012   Multiple thyroid  nodules 12/19/2011    PCP: Domenick Loma, NP  REFERRING PROVIDER: Duwayne Purchase, MD  REFERRING DIAG: M17.11 (ICD-10-CM) - Unilateral primary osteoarthritis, right knee  THERAPY DIAG:  Stiffness of right knee, not elsewhere classified  Chronic pain of right knee  Muscle weakness (generalized)  Difficulty in walking, not elsewhere classified  Rationale for Evaluation and Treatment: Rehabilitation  ONSET DATE: 06/13/2024  SUBJECTIVE:   SUBJECTIVE STATEMENT: Patient reports she felt good after her previous visit and does not currently have any pain in her knee. She continues to occasionally feel a catching sensation in her knee when she is going upstairs.  PERTINENT HISTORY: na PAIN:  08/31/24 Are you having pain? Yes: NPRS scale: 0/10 right knee, 0/10  left knee,  4/10 at worst for both Pain location: right knee Pain description: aching Aggravating factors: stairs, prolonged walking and standing, down inclines Relieving factors: rest, meds, ice  PRECAUTIONS: None  RED FLAGS: None   WEIGHT BEARING RESTRICTIONS: No  FALLS:  Has patient fallen in last 6 months? 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   NEXT MD VISIT: prn  OBJECTIVE:  Note: Objective measures were completed at Evaluation unless otherwise noted.  DIAGNOSTIC FINDINGS: xrays  PATIENT SURVEYS:  LEFS  Extreme difficulty/unable (0), Quite a bit of difficulty (1), Moderate difficulty (2), Little difficulty (3), No difficulty (4) Survey date:  eval 08/23/24  Any of your usual work, housework or school activities 3 4  2. Usual hobbies, recreational or sporting activities 3 4  3. Getting into/out of the bath 3 3  4. Walking between rooms 4 4  5. Putting on socks/shoes 4 4  6. Squatting  3 3  7. Lifting an object, like a bag of groceries from the floor 3 3  8. Performing light activities around your home 4 4  9. Performing heavy activities around your home 2 2  10. Getting into/out of a car 3 4  11. Walking 2 blocks 4 4  12. Walking 1 mile 4 4  13. Going up/down 10 stairs (1 flight) 1 3  14. Standing for 1 hour 2 2  15.  sitting for 1 hour 4 4  16. Running on  even ground 0 4  17. Running on uneven ground 0 1  18. Making sharp turns while running fast 0 1  19. Hopping  0 1  20. Rolling over in bed 4 4  Score total:  51/80 60/80     COGNITION: Overall cognitive status: Within functional limits for tasks assessed     SENSATION: WFL  POSTURE: Right knee valgus  PALPATION: Crepitus bilaterally (R > L) on seated knee flex/ext  LOWER EXTREMITY ROM:  All WFL with exception of right knee extension which is   LOWER EXTREMITY MMT:  08/23/24 Generally 4/5 throughout bilateral LE's: hip abd and ER 4-/5 bilaterally  LOWER EXTREMITY SPECIAL TESTS:  Knee special tests: Patellafemoral grind test: positive   FUNCTIONAL TESTS:  08/19/24: Sit to stand and stand to sit: good knee alignment maintained  Step down: slight knee valgus on Rt from 4 step with pain  Eval: 5 times sit to stand: 16.06 sec Timed up and go (TUG): 7.84 sec  08/23/24: 5 times sit to stand: 13.30 sec Timed up and go (TUG): 7.23 sec  GAIT: Distance walked: 30 feet Assistive device utilized: None Level of assistance: Complete Independence Comments: antalgic                                                                                                                              TREATMENT DATE:  08/31/24 Nustep x 6 min level 5  Seated clamshell with red loop 2 x 10 Lateral band walks with red loop 3 laps of 10 steps each way Step ups 6 x10 leading with left, x 10 leading with right  Lateral step ups 6 inch step x 10 each LE Step downs 4 inch step  2 x 10 B - with mirror for Rt knee valgus control feedback Iso hip abduction with purple ball x 10 5 sec hold bilaterally LAQ 2x10 5lb bil LE STS with chest press 5 lb KB 2 x 8  08/25/24 Nustep x 6 min level 4 (PT present to discuss status and progress toward goals, DC planning) Seated clamshell with red loop 2 x 10 Lateral band walks with red loop 3 laps of 10 steps each way Step ups 6 x10 leading with left, x 10  leading with right  Lateral step ups 6 inch step x 10 each LE Iso hip abduction with purple  ball x 10 5 sec hold Step downs 4 inch step  x 10 B - with mirror for Rt knee valgus control feedback (needed min UE support) Hooklying clam with red loop 2 x 10 SL clam with red loop 2 x 10 bilateral  LAQ 2x10 5lb bil LE   08/23/24 Nustep x 5 min level 4 (PT present to discuss status and progress toward goals, DC planning) Re-tested for upcoming DC/ discussed DC planning Seated clamshell with yellow loop 2 x 10 Lateral band walks with yellow loop 3 laps of 10 steps each way Step ups 6 x10 leading with left, x 10 leading with right with mirror for postural feedback  Lateral step ups 6 inch step x 10 each LE Step downs 4 inch step  x 10 B - with mirror for Rt knee valgus control feedback (needed min UE support) Cone touches with cone on adjustable table 3 x 10 each LE (started with table all the way up then lowered approx 2 levels for more challenge)  08/19/24 Step ups 6 x10 leading with left, x 10 leading with right Sit to stand in front of mirror for alignment biofeedback of knees, with 5lb chest press 2x10 LAQ 2x10 5lb bil LE Seated march  2x 20 with 5 lb ankle weights  Seated hip ER with 5 lb ankle weights 2 x 10 each LE Seated clam with yellow loop x 20 Lateral band walks with yellow loop 3 laps of 10 steps each way Step downs 4 inch step  2x4 B - with mirror for Rt knee valgus control feedback SL hip abd 2x10 each LE SL clam red loop 2x10 each LE SLS with unlocked knee 3-way taps x 5 rounds each LE    PATIENT EDUCATION:  Education details: Initiated HEP, educated on anatomy of the knee, valgus position of knee, importance of hip strength and proper alignment to avoid further deformity.  Person educated: Patient Education method: Explanation, Demonstration, Verbal cues, and Handouts Education comprehension: verbalized understanding, returned demonstration, and verbal cues  required  HOME EXERCISE PROGRAM: Access Code: [email or web address] URL: [email or web address] Date: 07/15/2024 Prepared by: Orvil Beuhring  Exercises - Supine Quadricep Sets  - 1 x daily - 7 x weekly - 1 sets - 20 reps - Supine Knee Extension Strengthening  - 1 x daily - 7 x weekly - 1 sets - 20 reps - Seated Long Arc Quad  - 1 x daily - 7 x weekly - 2 sets - 10 reps - 5 hold - Seated Knee Extension Stretch with Chair  - 1 x daily - 7 x weekly - 1 sets - 1 reps - work up to 30 min hold - Bridge with Hip Abduction and Resistance  - 1 x daily - 7 x weekly - 2 sets - 10 reps - Supine Active Straight Leg Raise  - 1 x daily - 7 x weekly - 2 sets - 10 reps - Sidelying Hip Abduction  - 1 x daily - 7 x weekly - 2 sets - 10 reps - Clamshell with Resistance  - 1 x daily - 7 x weekly - 2 sets - 10 reps  ASSESSMENT:  CLINICAL IMPRESSION: Patient tolerates the progression to two sets with forward step downs however, the patient continues to require the use of a mirror for visual cueing to reduce genu valgus. The patient reports a mild increase in pain on the R knee when performing lateral step ups, but the pain returns to baseline upon completing the  exercise. Continued skilled physical therapy is necessary in order to reduce pain, improve RLE strength, and improve body mechanics when ascending/descending stairs.    OBJECTIVE IMPAIRMENTS: Abnormal gait, decreased activity tolerance, decreased balance, decreased mobility, difficulty walking, decreased ROM, decreased strength, increased edema, increased fascial restrictions, increased muscle spasms, impaired flexibility, improper body mechanics, postural dysfunction, obesity, and pain.   ACTIVITY LIMITATIONS: carrying, lifting, bending, sitting, standing, squatting, sleeping, stairs, transfers, bed mobility, bathing, toileting, dressing, and caring for others  PARTICIPATION LIMITATIONS: meal prep, cleaning, laundry, driving, shopping, community  activity, occupation, yard work, and church  PERSONAL FACTORS: Fitness, Past/current experiences, Profession, and 1-2 comorbidities: osteoporosis and anxiety are also affecting patient's functional outcome.   REHAB POTENTIAL: Good  CLINICAL DECISION MAKING: Stable/uncomplicated  EVALUATION COMPLEXITY: Low   GOALS: Goals reviewed with patient? Yes  SHORT TERM GOALS: Target date: 07/28/2024   Patient will be independent with initial HEP  Baseline: Goal status:MET  8/1  2.  Pain report to be no greater than 4/10  Baseline:  Goal status: MET 08/09/24   LONG TERM GOALS: Target date: 09/23/2024   Patient to be independent with advanced HEP  Baseline:  Goal status: In progress 08/25/2024  2.  Patient to report pain no greater than 2/10  Baseline:  Goal status: ONGOING - reaches 4/10 briefly when going down stairs or a hill 08/25/2024  3.  Functional scores to improve by 2-3 seconds Baseline:  Goal status: MET 08/23/24  4.  LEFS score to improve to 55 Baseline:  Goal status: MET 08/23/24  5.  Patient to report 85% improvement in overall symptoms  Baseline:  Goal status: MET 08/19/24 (however, today she says 50% better)  6.  Patient to be able to ascend and descend steps without pain or no greater than 2/10  Baseline:  Goal status: IN PROGRESS 08/25/2024  6.  Patient will complete 5STS in < or = to 11 sec due to improve LE strength. Baseline: 13.30 sec Goal status: NEW    PLAN:  PT FREQUENCY: 1-2x/week  PT DURATION: 4 weeks  PLANNED INTERVENTIONS: 97110-Therapeutic exercises, 97530- Therapeutic activity, 97112- Neuromuscular re-education, [phone]- Self Care, 02859- Manual therapy, [phone]- Gait training, [phone]- Aquatic Therapy, [phone]- Electrical stimulation (unattended), [phone]- Electrical stimulation (manual), [id number]- Vasopneumatic device, Patient/Family education, Balance training, Stair training, Taping, Joint mobilization, Spinal mobilization, Compression bandaging, DME  instructions, Cryotherapy, and Moist heat  PLAN FOR NEXT SESSION:  Rt hip strength, eccentric quad strength, functional step down training, Nustep, progress HEP, quad rehab, hip strengthening  Delon Pack, SPT  08/31/24 12:01 PM [facility] [address] [place], [place] [address] Phone # [phone] Fax [phone]

## 2024-09-05 ENCOUNTER — Telehealth: Payer: Self-pay

## 2024-09-05 ENCOUNTER — Ambulatory Visit (INDEPENDENT_AMBULATORY_CARE_PROVIDER_SITE_OTHER)

## 2024-09-05 VITALS — BP 116/79 | HR 79 | Temp 97.9°F | Resp 20 | Ht 66.0 in | Wt 207.0 lb

## 2024-09-05 DIAGNOSIS — M810 Age-related osteoporosis without current pathological fracture: Secondary | ICD-10-CM | POA: Diagnosis not present

## 2024-09-05 MED ORDER — DENOSUMAB 60 MG/ML ~~LOC~~ SOSY
60.0000 mg | PREFILLED_SYRINGE | Freq: Once | SUBCUTANEOUS | Status: AC
  Administered 2024-09-05: 60 mg via SUBCUTANEOUS
  Filled 2024-09-05: qty 1

## 2024-09-05 NOTE — Progress Notes (Signed)
 DiDiagnosis: Osteoporosis  Provider:  Mannam, Praveen MD  Procedure: Injection  Prolia  (Denosumab ), Dose: 60 mg, Site: subcutaneous, Number of injections: 1  Injection Site(s): Left arm  Post Care: Patient declined observation  Discharge: Condition: Good, Destination: Home . AVS Provided  Performed by:  Leita FORBES Miles, LPN

## 2024-09-05 NOTE — Telephone Encounter (Signed)
 Called and spoke with [place], Research officer, political party in Medical Records at [employer] [phone]). Confirmed that patient had blood drawn on 09/03/24 with a calcium  value of 9.7; will fax results to our clinic.  Rocky FORBES Sar, RN   [facility] [address] [place] [place], [place] [address] [phone]

## 2024-09-06 ENCOUNTER — Ambulatory Visit

## 2024-09-06 DIAGNOSIS — G8929 Other chronic pain: Secondary | ICD-10-CM

## 2024-09-06 DIAGNOSIS — M6281 Muscle weakness (generalized): Secondary | ICD-10-CM

## 2024-09-06 DIAGNOSIS — M25561 Pain in right knee: Secondary | ICD-10-CM | POA: Diagnosis not present

## 2024-09-06 DIAGNOSIS — R293 Abnormal posture: Secondary | ICD-10-CM

## 2024-09-06 DIAGNOSIS — M25661 Stiffness of right knee, not elsewhere classified: Secondary | ICD-10-CM

## 2024-09-06 DIAGNOSIS — R262 Difficulty in walking, not elsewhere classified: Secondary | ICD-10-CM

## 2024-09-06 DIAGNOSIS — R252 Cramp and spasm: Secondary | ICD-10-CM

## 2024-09-06 NOTE — Therapy (Signed)
 OUTPATIENT PHYSICAL THERAPY LOWER EXTREMITY TREATMENT      Patient Name: Kimberly Ryan MRN: [id number] DOB:07/10/1951, 73 y.o., female Today's Date: 09/06/2024  END OF SESSION:  PT End of Session - 09/06/24 1110     Visit Number 14    Date for Recertification  09/23/24    Authorization Type [employer]    PT Start Time 1105    PT Stop Time 1146    PT Time Calculation (min) 41 min    Activity Tolerance Patient tolerated treatment well    Behavior During Therapy WFL for tasks assessed/performed           Past Medical History:  Diagnosis Date   Anxiety    Arthritis    GERD (gastroesophageal reflux disease)    Headache(784.0)    Shortness of breath    with exertion    Thyroid  disease    Past Surgical History:  Procedure Laterality Date   MYOMECTOMY  2000   RHINOPLASTY  1953   THYROID  LOBECTOMY  08/17/2012   Procedure: THYROID  LOBECTOMY;  Surgeon: Camellia CHRISTELLA Blush, MD,FACS;  Location: [facility];  Service: General;  Laterality: Left;  Left Thyroid  Lobectomy   TONSILLECTOMY  1954   Patient Active Problem List   Diagnosis Date Noted   OP (osteoporosis) 01/06/2023   Postsurgical hypothyroidism 11/04/2012   Multiple thyroid  nodules 12/19/2011    PCP: Domenick Loma, NP  REFERRING PROVIDER: Duwayne Purchase, MD  REFERRING DIAG: M17.11 (ICD-10-CM) - Unilateral primary osteoarthritis, right knee  THERAPY DIAG:  Chronic pain of right knee  Muscle weakness (generalized)  Difficulty in walking, not elsewhere classified  Stiffness of right knee, not elsewhere classified  Cramp and spasm  Abnormal posture  Rationale for Evaluation and Treatment: Rehabilitation  ONSET DATE: 06/13/2024  SUBJECTIVE:   SUBJECTIVE STATEMENT: Patient reports no new issues.  Doing pretty good.    PERTINENT HISTORY: na PAIN:  09/06/24 Are you having pain? Yes: NPRS scale: 0/10 right knee, 0/10 left knee,  3/10 at worst for both Pain location: right knee Pain  description: aching Aggravating factors: stairs, prolonged walking and standing, down inclines Relieving factors: rest, meds, ice  PRECAUTIONS: None  RED FLAGS: None   WEIGHT BEARING RESTRICTIONS: No  FALLS:  Has patient fallen in last 6 months? 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   NEXT MD VISIT: prn  OBJECTIVE:  Note: Objective measures were completed at Evaluation unless otherwise noted.  DIAGNOSTIC FINDINGS: xrays  PATIENT SURVEYS:  LEFS  Extreme difficulty/unable (0), Quite a bit of difficulty (1), Moderate difficulty (2), Little difficulty (3), No difficulty (4) Survey date:  eval 08/23/24  Any of your usual work, housework or school activities 3 4  2. Usual hobbies, recreational or sporting activities 3 4  3. Getting into/out of the bath 3 3  4. Walking between rooms 4 4  5. Putting on socks/shoes 4 4  6. Squatting  3 3  7. Lifting an  object, like a bag of groceries from the floor 3 3  8. Performing light activities around your home 4 4  9. Performing heavy activities around your home 2 2  10. Getting into/out of a car 3 4  11. Walking 2 blocks 4 4  12. Walking 1 mile 4 4  13. Going up/down 10 stairs (1 flight) 1 3  14. Standing for 1 hour 2 2  15.  sitting for 1 hour 4 4  16. Running on even ground 0 4  17. Running on uneven ground 0 1  18. Making sharp  turns while running fast 0 1  19. Hopping  0 1  20. Rolling over in bed 4 4  Score total:  51/80 60/80     COGNITION: Overall cognitive status: Within functional limits for tasks assessed     SENSATION: WFL  POSTURE: Right knee valgus  PALPATION: Crepitus bilaterally (R > L) on seated knee flex/ext  LOWER EXTREMITY ROM:  All WFL with exception of right knee extension which is   LOWER EXTREMITY MMT:  08/23/24 Generally 4/5 throughout bilateral LE's: hip abd and ER 4-/5 bilaterally  LOWER EXTREMITY SPECIAL TESTS:  Knee special tests: Patellafemoral grind test: positive   FUNCTIONAL TESTS:  08/19/24: Sit to stand and stand to sit: good knee alignment maintained  Step down: slight knee valgus on Rt from 4 step with pain  Eval: 5 times sit to stand: 16.06 sec Timed up and go (TUG): 7.84 sec  08/23/24: 5 times sit to stand: 13.30 sec Timed up and go (TUG): 7.23 sec  GAIT: Distance walked: 30 feet Assistive device utilized: None Level of assistance: Complete Independence Comments: antalgic                                                                                                                              TREATMENT DATE:  09/06/24 Recumbent bike x 5 min level 2 Cone touches 3 x 10 each LE cone on 24 inch side of rit fit box (vc's for engaging hip and core for balance strategies) Seated clamshell with red loop 2 x 10 Lateral band walks with red loop 3 laps of 10 steps each way Step ups 6 2 x 10 leading with left, 2 x 10 leading with right  Heel taps 2 inch step  2 x 10 B - with mirror for Rt knee valgus control feedback Fwd heel taps for eccentric step down control on reebok step with no risers Iso hip abduction with purple ball x 10 5 sec hold bilaterally LAQ 2x10 5lb bil LE with purple ball between knees x 20 each LE  08/31/24 Nustep x 6 min level 5  Seated clamshell with red loop 2 x 10 Lateral band walks with red loop 3 laps of 10 steps each way Step ups  6 x10 leading with left, x 10 leading with right  Lateral step ups 6 inch step x 10 each LE  Step downs 4 inch step  2 x 10 B - with mirror for Rt knee valgus control feedback Iso hip abduction with purple ball x 10 5 sec hold bilaterally LAQ 2x10 5lb bil LE STS with chest press 5 lb KB 2 x 8  08/25/24 Nustep x 6 min level 4 (PT present to discuss status and progress toward goals, DC planning) Seated clamshell with red loop 2 x 10 Lateral band walks with red loop 3 laps of 10 steps each way Step ups 6 x10 leading with left, x 10 leading with right  Lateral step ups 6 inch step x 10 each LE Iso hip abduction with purple ball x 10 5 sec hold Step downs 4 inch step  x 10 B - with mirror for Rt knee valgus control feedback (needed min UE support) Hooklying clam with red loop 2 x 10 SL clam with red loop 2 x 10 bilateral  LAQ 2x10 5lb bil LE   PATIENT EDUCATION:  Education details: Initiated HEP, educated on anatomy of the knee, valgus position of knee, importance of hip strength and proper alignment to avoid further deformity.  Person educated: Patient Education method: Explanation, Demonstration, Verbal cues, and Handouts Education comprehension: verbalized understanding, returned demonstration, and verbal cues required  HOME EXERCISE PROGRAM: Access Code: [email or web address] URL: [email or web address] Date: 07/15/2024 Prepared by: Orvil Beuhring  Exercises - Supine Quadricep Sets  - 1 x daily - 7 x weekly - 1 sets - 20 reps - Supine Knee Extension Strengthening  - 1 x daily - 7 x weekly - 1 sets - 20 reps - Seated Long Arc Quad  - 1 x daily - 7 x weekly - 2 sets - 10 reps - 5 hold - Seated Knee Extension Stretch with Chair  - 1 x daily - 7 x weekly - 1 sets - 1 reps - work up to 30 min hold - Bridge with Hip Abduction and Resistance  - 1 x daily - 7 x weekly - 2 sets - 10 reps - Supine Active Straight Leg Raise  - 1 x daily - 7 x weekly - 2 sets - 10 reps - Sidelying Hip  Abduction  - 1 x daily - 7 x weekly - 2 sets - 10 reps - Clamshell with Resistance  - 1 x daily - 7 x weekly - 2 sets - 10 reps  ASSESSMENT:  CLINICAL IMPRESSION: Raileigh is progressing appropriately and applying all concepts of alignment.  She appears to have improved control of valgus Continued skilled physical therapy is necessary in order to reduce pain, improve RLE strength, and improve body mechanics when ascending/descending stairs.    OBJECTIVE IMPAIRMENTS: Abnormal gait, decreased activity tolerance, decreased balance, decreased mobility, difficulty walking, decreased ROM, decreased strength, increased edema, increased fascial restrictions, increased muscle spasms, impaired flexibility, improper body mechanics, postural dysfunction, obesity, and pain.   ACTIVITY LIMITATIONS: carrying, lifting, bending, sitting, standing, squatting, sleeping, stairs, transfers, bed mobility, bathing, toileting, dressing, and caring for others  PARTICIPATION LIMITATIONS: meal prep, cleaning, laundry, driving, shopping, community activity, occupation, yard work, and church  PERSONAL FACTORS: Fitness, Past/current experiences, Profession, and 1-2 comorbidities: osteoporosis and anxiety are also affecting patient's functional outcome.   REHAB POTENTIAL: Good  CLINICAL DECISION MAKING: Stable/uncomplicated  EVALUATION COMPLEXITY: Low   GOALS: Goals reviewed with patient? Yes  SHORT TERM GOALS: Target date: 07/28/2024   Patient will be independent with initial HEP  Baseline: Goal status:MET  8/1  2.  Pain report to be no greater  than 4/10  Baseline:  Goal status: MET 08/09/24   LONG TERM GOALS: Target date: 09/23/2024   Patient to be independent with advanced HEP  Baseline:  Goal status: In progress 08/25/2024  2.  Patient to report pain no greater than 2/10  Baseline:  Goal status: ONGOING - reaches 4/10 briefly when going down stairs or a hill 08/25/2024  3.  Functional scores to  improve by 2-3 seconds Baseline:  Goal status: MET 08/23/24  4.  LEFS score to improve to 55 Baseline:  Goal status: MET 08/23/24  5.  Patient to report 85% improvement in overall symptoms  Baseline:  Goal status: MET 08/19/24 (however, today she says 50% better)  6.  Patient to be able to ascend and descend steps without pain or no greater than 2/10  Baseline:  Goal status: IN PROGRESS 08/25/2024  6.  Patient will complete 5STS in < or = to 11 sec due to improve LE strength. Baseline: 13.30 sec Goal status: NEW    PLAN:  PT FREQUENCY: 1-2x/week  PT DURATION: 4 weeks  PLANNED INTERVENTIONS: 97110-Therapeutic exercises, 97530- Therapeutic activity, [id number]- Neuromuscular re-education, 97535- Self Care, 02859- Manual therapy, [phone]- Gait training, [phone]- Aquatic Therapy, [phone]- Electrical stimulation (unattended), [phone]- Electrical stimulation (manual), 97016- Vasopneumatic device, Patient/Family education, Balance training, Stair training, Taping, Joint mobilization, Spinal mobilization, Compression bandaging, DME instructions, Cryotherapy, and Moist heat  PLAN FOR NEXT SESSION:  Rt hip strength, eccentric quad strength, functional step down training, Nustep, progress HEP, quad rehab, hip strengthening  Stephani Janak B. Ridley Schewe, PT 09/06/24 11:54 AM [facility] [address] [place], [place] [address] Phone # [phone] Fax [phone]

## 2024-09-13 NOTE — Therapy (Signed)
 OUTPATIENT PHYSICAL THERAPY LOWER EXTREMITY TREATMENT      Patient Name: Kimberly Ryan MRN: [id number] DOB:09/12/1951, 73 y.o., female Today's Date: 09/13/2024  END OF SESSION:     Past Medical History:  Diagnosis Date   Anxiety    Arthritis    GERD (gastroesophageal reflux disease)    Headache(784.0)    Shortness of breath    with exertion    Thyroid  disease    Past Surgical History:  Procedure Laterality Date   MYOMECTOMY  2000   RHINOPLASTY  1953   THYROID  LOBECTOMY  08/17/2012   Procedure: THYROID  LOBECTOMY;  Surgeon: Camellia CHRISTELLA Blush, MD,FACS;  Location: [facility];  Service: General;  Laterality: Left;  Left Thyroid  Lobectomy   TONSILLECTOMY  1954   Patient Active Problem List   Diagnosis Date Noted   OP (osteoporosis) 01/06/2023   Postsurgical hypothyroidism 11/04/2012   Multiple thyroid  nodules 12/19/2011    PCP: Pridgen, Taylar, NP  REFERRING PROVIDER: Duwayne Purchase, MD  REFERRING DIAG: M17.11 (ICD-10-CM) - Unilateral primary osteoarthritis, right knee  THERAPY DIAG:  No diagnosis found.  Rationale for Evaluation and Treatment: Rehabilitation  ONSET DATE: 06/13/2024  SUBJECTIVE:   SUBJECTIVE STATEMENT: ***  PERTINENT HISTORY: na PAIN:  09/06/24 Are you having pain? Yes: NPRS scale: 0/10 right knee, 0/10 left knee,  3/10 at worst for both Pain location: right knee Pain description: aching Aggravating factors: stairs, prolonged walking and standing, down inclines Relieving factors: rest, meds, ice  PRECAUTIONS: None  RED FLAGS: None   WEIGHT BEARING RESTRICTIONS: No  FALLS:  Has patient fallen in last 6 months? 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   NEXT MD VISIT: prn  OBJECTIVE:  Note: Objective measures were completed at Evaluation unless otherwise noted.  DIAGNOSTIC FINDINGS: xrays  PATIENT SURVEYS:  LEFS  Extreme difficulty/unable (0), Quite a bit of difficulty (1), Moderate difficulty (2), Little difficulty (3), No difficulty (4) Survey date:  eval 08/23/24  Any of your usual work, housework or school activities 3 4  2. Usual hobbies, recreational or sporting activities 3 4  3. Getting into/out of the bath 3 3  4. Walking between rooms 4 4  5. Putting on socks/shoes 4 4  6. Squatting  3 3  7. Lifting an object, like a bag of groceries from the floor 3 3  8. Performing light activities around your home 4 4  9. Performing heavy activities around your home 2 2  10. Getting into/out of a car 3 4  11. Walking 2 blocks 4 4  12. Walking 1 mile 4 4  13. Going up/down 10 stairs (1 flight) 1 3  14. Standing for 1 hour 2 2  15.  sitting for 1 hour 4 4  16. Running on even ground 0 4  17. Running on uneven ground 0 1  18. Making sharp turns while running fast 0 1  19. Hopping  0 1  20. Rolling  over in bed 4 4  Score total:  51/80 60/80     COGNITION: Overall cognitive status: Within functional limits for tasks assessed     SENSATION: WFL  POSTURE: Right knee valgus  PALPATION: Crepitus bilaterally (R > L) on seated knee flex/ext  LOWER EXTREMITY ROM:  All WFL with exception of right knee extension which is   LOWER EXTREMITY MMT:  08/23/24 Generally 4/5 throughout bilateral LE's: hip abd and ER 4-/5 bilaterally  LOWER EXTREMITY SPECIAL TESTS:  Knee special tests: Patellafemoral grind test:  positive   FUNCTIONAL TESTS:  08/19/24: Sit to stand and stand to sit: good knee alignment maintained  Step down: slight knee valgus on Rt from 4 step with pain  Eval: 5 times sit to stand: 16.06 sec Timed up and go (TUG): 7.84 sec  08/23/24: 5 times sit to stand: 13.30 sec Timed up and go (TUG): 7.23 sec  GAIT: Distance walked: 30 feet Assistive device utilized: None Level of assistance: Complete Independence Comments: antalgic                                                                                                                              TREATMENT DATE:  09/14/24 Recumbent bike x 5 min level 2 Cone touches 3 x 10 each LE cone on 24 inch side of rit fit box (vc's for engaging hip and core for balance strategies) Seated clamshell with red loop 2 x 10 Lateral band walks with red loop 3 laps of 10 steps each way Step ups 6 2 x 10 leading with left, 2 x 10 leading with right  Heel taps 2 inch step  2 x 10 B - with mirror for Rt knee valgus control feedback Fwd heel taps for eccentric step down control on reebok step with no risers Iso hip abduction with purple ball x 10 5 sec hold bilaterally LAQ 2x10 5lb bil LE with purple ball between knees x 20 each LE   09/06/24 Recumbent bike x 5 min level 2 Cone touches 3 x 10 each LE cone on 24 inch side of rit fit box (vc's for engaging hip and core for balance strategies) Seated clamshell with red loop 2 x 10 Lateral band walks with red loop 3 laps of 10 steps each way Step ups 6 2 x 10 leading with left, 2 x 10 leading with right  Heel taps 2 inch step  2 x 10 B - with mirror for Rt knee valgus control feedback Fwd heel taps for eccentric step down control on reebok step with no risers Iso hip abduction with purple ball x 10 5 sec hold bilaterally LAQ 2x10 5lb bil LE with purple ball between knees x 20 each LE  08/31/24 Nustep x 6 min level 5  Seated clamshell with red loop 2 x 10 Lateral band walks with red loop 3 laps  of 10 steps each way Step ups 6 x10 leading with left, x  10 leading with right  Lateral step ups 6 inch step x 10 each LE Step downs 4 inch step  2 x 10 B - with mirror for Rt knee valgus control feedback Iso hip abduction with purple ball x 10 5 sec hold bilaterally LAQ 2x10 5lb bil LE STS with chest press 5 lb KB 2 x 8  08/25/24 Nustep x 6 min level 4 (PT present to discuss status and progress toward goals, DC planning) Seated clamshell with red loop 2 x 10 Lateral band walks with red loop 3 laps of 10 steps each way Step ups 6 x10 leading with left, x 10 leading with right  Lateral step ups 6 inch step x 10 each LE Iso hip abduction with purple ball x 10 5 sec hold Step downs 4 inch step  x 10 B - with mirror for Rt knee valgus control feedback (needed min UE support) Hooklying clam with red loop 2 x 10 SL clam with red loop 2 x 10 bilateral  LAQ 2x10 5lb bil LE   PATIENT EDUCATION:  Education details: Initiated HEP, educated on anatomy of the knee, valgus position of knee, importance of hip strength and proper alignment to avoid further deformity.  Person educated: Patient Education method: Explanation, Demonstration, Verbal cues, and Handouts Education comprehension: verbalized understanding, returned demonstration, and verbal cues required  HOME EXERCISE PROGRAM: Access Code: [email or web address] URL: [email or web address] Date: 07/15/2024 Prepared by: Orvil Beuhring  Exercises - Supine Quadricep Sets  - 1 x daily - 7 x weekly - 1 sets - 20 reps - Supine Knee Extension Strengthening  - 1 x daily - 7 x weekly - 1 sets - 20 reps - Seated Long Arc Quad  - 1 x daily - 7 x weekly - 2 sets - 10 reps - 5 hold - Seated Knee Extension Stretch with Chair  - 1 x daily - 7 x weekly - 1 sets - 1 reps - work up to 30 min hold - Bridge with Hip Abduction and Resistance  - 1 x daily - 7 x weekly - 2 sets - 10 reps - Supine Active Straight Leg Raise  - 1 x daily - 7 x weekly - 2 sets  - 10 reps - Sidelying Hip Abduction  - 1 x daily - 7 x weekly - 2 sets - 10 reps - Clamshell with Resistance  - 1 x daily - 7 x weekly - 2 sets - 10 reps  ASSESSMENT:  CLINICAL IMPRESSION: ***   OBJECTIVE IMPAIRMENTS: Abnormal gait, decreased activity tolerance, decreased balance, decreased mobility, difficulty walking, decreased ROM, decreased strength, increased edema, increased fascial restrictions, increased muscle spasms, impaired flexibility, improper body mechanics, postural dysfunction, obesity, and pain.   ACTIVITY LIMITATIONS: carrying, lifting, bending, sitting, standing, squatting, sleeping, stairs, transfers, bed mobility, bathing, toileting, dressing, and caring for others  PARTICIPATION LIMITATIONS: meal prep, cleaning, laundry, driving, shopping, community activity, occupation, yard work, and church  PERSONAL FACTORS: Fitness, Past/current experiences, Profession, and 1-2 comorbidities: osteoporosis and anxiety are also affecting patient's functional outcome.   REHAB POTENTIAL: Good  CLINICAL DECISION MAKING: Stable/uncomplicated  EVALUATION COMPLEXITY: Low   GOALS: Goals reviewed with patient? Yes  SHORT TERM GOALS: Target date: 07/28/2024   Patient will be independent with initial HEP  Baseline: Goal status:MET  8/1  2.  Pain report to be no greater than 4/10  Baseline:  Goal status: MET 08/09/24   LONG TERM GOALS: Target date: 09/23/2024   Patient to be independent with advanced  HEP  Baseline:  Goal status: In progress 08/25/2024  2.  Patient to report pain no greater than 2/10  Baseline:  Goal status: ONGOING - reaches 4/10 briefly when going down stairs or a hill 08/25/2024  3.  Functional scores to improve by 2-3 seconds Baseline:  Goal status: MET 08/23/24  4.  LEFS score to improve to 55 Baseline:  Goal status: MET 08/23/24  5.  Patient to report 85% improvement in overall symptoms  Baseline:  Goal status: MET 08/19/24 (however, today she  says 50% better)  6.  Patient to be able to ascend and descend steps without pain or no greater than 2/10  Baseline:  Goal status: IN PROGRESS 08/25/2024  6.  Patient will complete 5STS in < or = to 11 sec due to improve LE strength. Baseline: 13.30 sec Goal status: NEW    PLAN:  PT FREQUENCY: 1-2x/week  PT DURATION: 4 weeks  PLANNED INTERVENTIONS: 97110-Therapeutic exercises, 97530- Therapeutic activity, 97112- Neuromuscular re-education, [phone]- Self Care, 02859- Manual therapy, [phone]- Gait training, [phone]- Aquatic Therapy, [phone]- Electrical stimulation (unattended), [phone]- Electrical stimulation (manual), 97016- Vasopneumatic device, Patient/Family education, Balance training, Stair training, Taping, Joint mobilization, Spinal mobilization, Compression bandaging, DME instructions, Cryotherapy, and Moist heat  PLAN FOR NEXT SESSION:  Rt hip strength, eccentric quad strength, functional step down training, Nustep, progress HEP, quad rehab, hip strengthening  Mliss Cummins, PT  09/13/24 8:19 PM [facility] [address] [place], [place] [address] Phone # [phone] Fax [phone]

## 2024-09-14 ENCOUNTER — Encounter: Payer: Self-pay | Admitting: Physical Therapy

## 2024-09-14 ENCOUNTER — Ambulatory Visit: Attending: Specialist | Admitting: Physical Therapy

## 2024-09-14 DIAGNOSIS — G8929 Other chronic pain: Secondary | ICD-10-CM | POA: Insufficient documentation

## 2024-09-14 DIAGNOSIS — R293 Abnormal posture: Secondary | ICD-10-CM | POA: Diagnosis not present

## 2024-09-14 DIAGNOSIS — R262 Difficulty in walking, not elsewhere classified: Secondary | ICD-10-CM | POA: Insufficient documentation

## 2024-09-14 DIAGNOSIS — R252 Cramp and spasm: Secondary | ICD-10-CM | POA: Insufficient documentation

## 2024-09-14 DIAGNOSIS — M25661 Stiffness of right knee, not elsewhere classified: Secondary | ICD-10-CM | POA: Diagnosis not present

## 2024-09-14 DIAGNOSIS — M6281 Muscle weakness (generalized): Secondary | ICD-10-CM | POA: Insufficient documentation

## 2024-09-14 DIAGNOSIS — M25561 Pain in right knee: Secondary | ICD-10-CM | POA: Diagnosis not present

## 2024-09-20 ENCOUNTER — Ambulatory Visit

## 2024-09-20 DIAGNOSIS — M25661 Stiffness of right knee, not elsewhere classified: Secondary | ICD-10-CM

## 2024-09-20 DIAGNOSIS — M25561 Pain in right knee: Secondary | ICD-10-CM | POA: Diagnosis not present

## 2024-09-20 DIAGNOSIS — R252 Cramp and spasm: Secondary | ICD-10-CM

## 2024-09-20 DIAGNOSIS — G8929 Other chronic pain: Secondary | ICD-10-CM

## 2024-09-20 DIAGNOSIS — R262 Difficulty in walking, not elsewhere classified: Secondary | ICD-10-CM

## 2024-09-20 DIAGNOSIS — R293 Abnormal posture: Secondary | ICD-10-CM

## 2024-09-20 DIAGNOSIS — M6281 Muscle weakness (generalized): Secondary | ICD-10-CM

## 2024-09-20 NOTE — Therapy (Signed)
 OUTPATIENT PHYSICAL THERAPY LOWER EXTREMITY TREATMENT      Patient Name: Kimberly Ryan MRN: [id number] DOB:March 08, 1951, 73 y.o., female Today's Date: 09/20/2024  END OF SESSION:  PT End of Session - 09/20/24 1027     Visit Number 16    Date for Recertification  09/23/24    Authorization Type [employer]    Progress Note Due on Visit 20    PT Start Time 1022    PT Stop Time 1100    PT Time Calculation (min) 38 min    Activity Tolerance Patient tolerated treatment well    Behavior During Therapy WFL for tasks assessed/performed            Past Medical History:  Diagnosis Date   Anxiety    Arthritis    GERD (gastroesophageal reflux disease)    Headache(784.0)    Shortness of breath    with exertion    Thyroid  disease    Past Surgical History:  Procedure Laterality Date   MYOMECTOMY  2000   RHINOPLASTY  1953   THYROID  LOBECTOMY  08/17/2012   Procedure: THYROID  LOBECTOMY;  Surgeon: Camellia CHRISTELLA Blush, MD,FACS;  Location: [facility];  Service: General;  Laterality: Left;  Left Thyroid  Lobectomy   TONSILLECTOMY  1954   Patient Active Problem List   Diagnosis Date Noted   OP (osteoporosis) 01/06/2023   Postsurgical hypothyroidism 11/04/2012   Multiple thyroid  nodules 12/19/2011    PCP: Pridgen, Taylar, NP  REFERRING PROVIDER: Duwayne Purchase, MD  REFERRING DIAG: M17.11 (ICD-10-CM) - Unilateral primary osteoarthritis, right knee  THERAPY DIAG:  Chronic pain of right knee  Muscle weakness (generalized)  Difficulty in walking, not elsewhere classified  Stiffness of right knee, not elsewhere classified  Cramp and spasm  Abnormal posture  Rationale for Evaluation and Treatment: Rehabilitation  ONSET DATE: 06/13/2024  SUBJECTIVE:   SUBJECTIVE STATEMENT: No pain today.   PERTINENT HISTORY: na PAIN:  09/20/24 Are you having pain? Yes: NPRS scale: 0/10 right knee, 0/10 left knee,  3/10 at worst for both Pain location: right knee Pain  description: aching Aggravating factors: stairs, prolonged walking and standing, down inclines Relieving factors: rest, meds, ice  PRECAUTIONS: None  RED FLAGS: None   WEIGHT BEARING RESTRICTIONS: No  FALLS:  Has patient fallen in last 6 months? 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   NEXT MD VISIT: prn  OBJECTIVE:  Note: Objective measures were completed at Evaluation unless otherwise noted.  DIAGNOSTIC FINDINGS: xrays  PATIENT SURVEYS:  LEFS  Extreme difficulty/unable (0), Quite a bit of difficulty (1), Moderate difficulty (2), Little difficulty (3), No difficulty (4) Survey date:  eval 08/23/24 09/20/24  Any of your usual work, housework or school activities 3 4   2. Usual hobbies, recreational or sporting activities 3 4   3. Getting into/out of the bath 3 3   4. Walking between rooms 4 4   5. Putting on socks/shoes 4 4  6. Squatting  3 3   7. Lifting an object, like a bag of groceries from the floor 3 3   8. Performing light activities around your home 4 4   9. Performing heavy activities around your home 2 2   10. Getting into/out of a car 3 4   11. Walking 2 blocks 4 4   12. Walking 1 mile 4 4   13. Going up/down 10 stairs (1 flight) 1 3   14. Standing for 1 hour 2 2   15.  sitting for 1 hour 4 4   16. Running on even ground 0 4   17. Running on uneven  ground 0 1   18. Making sharp turns while running fast 0 1   19. Hopping  0 1   20. Rolling over in bed 4 4   Score total:  51/80 60/80 61/80      COGNITION: Overall cognitive status: Within functional limits for tasks assessed     SENSATION: WFL  POSTURE: Right knee valgus  PALPATION: Crepitus bilaterally (R > L) on seated knee flex/ext  LOWER EXTREMITY ROM:  All WFL with exception of right knee extension which is   LOWER EXTREMITY MMT:  08/23/24 Generally 4/5 throughout bilateral LE's: hip abd and ER 4-/5 bilaterally  LOWER EXTREMITY SPECIAL TESTS:  Knee special tests: Patellafemoral grind test: positive   FUNCTIONAL TESTS:  08/19/24: Sit to stand and stand to sit: good knee alignment maintained  Step down: slight knee valgus on Rt from 4 step with pain  Eval: 5 times sit to stand: 16.06 sec Timed up and go (TUG): 7.84 sec  08/23/24: 5 times sit to stand: 13.30 sec Timed up and go (TUG): 7.23 sec  09/20/24: 08/23/24: 5 times sit to stand: 10.42 sec Timed up and go (TUG): 6.38 sec  GAIT: Distance walked: 30 feet Assistive device utilized: None Level of assistance: Complete Independence Comments: antalgic                                                                                                                              TREATMENT DATE:  09/20/24 DC assessment completed Lateral band walks with red loop 3 laps of 10 steps each way Seated clamshell with red loop x 20 Step ups 6 2 x 10 leading with left, 2 x 10 leading with right  Cone touches 3 x 10 each LE cone on 24 inch side of rit fit box (vc's for engaging hip and core for balance strategies) 3 way step out red loop x 5 B Lateral heel taps on 6 inch 2 x 10  Seated LAQ x 20 with 6lbs Seated march with 6 lbs x 20  09/14/24 Seated clamshell with red loop 2 x 10 Lateral band walks with red loop 3 laps of 10 steps each way 3 way step out red loop x 5 B Step ups 6 2 x 10 leading with left, 2 x 10  leading with right  Heel taps 2 inch step  2 x 10 B - with mirror for Rt knee valgus control feedback Fwd heel step downs x 10 L; heel taps for eccentric step down control on reebok step with no risers x 10 B Iso hip abduction with purple ball x 10 5 sec hold bilaterally Cone touches 3 x 10 each LE cone on 24 inch side of rit fit box (vc's for engaging hip and core for balance strategies) LAQ 2x10 6lb bil LE with purple ball between knees x 20 each LE Nustep L6 x 5 min for strengthening   09/06/24 Recumbent bike x 5 min level 2 Cone touches 3 x 10 each LE cone on 24 inch side of rit fit box (vc's for engaging hip and core for balance strategies) Seated clamshell with red loop 2 x 10 Lateral band walks with red loop 3 laps of 10 steps each way Step ups 6 2 x 10 leading with left, 2 x 10 leading with right  Heel taps 2 inch step  2 x 10 B - with mirror for Rt knee valgus control feedback Fwd heel taps for eccentric step down control on reebok step with no risers Iso hip abduction with purple ball x 10 5 sec hold bilaterally LAQ 2x10 5lb bil LE with purple ball between knees x 20 each LE  PATIENT EDUCATION:  Education details: Initiated HEP, educated on anatomy of the knee, valgus position of knee, importance of hip strength and proper alignment to avoid further deformity.  Person educated: Patient Education method: Explanation, Demonstration, Verbal cues, and Handouts Education comprehension: verbalized understanding, returned demonstration, and verbal cues required  HOME EXERCISE PROGRAM: Access Code: [email or web address] URL: [email or web address] Date: 09/20/2024 Prepared by: Delon Haddock  Exercises - Supine Quadricep Sets  - 1 x daily - 7 x weekly - 1 sets - 20 reps - Supine Knee Extension Strengthening  - 1 x daily - 7 x weekly - 1 sets - 20 reps - Seated Long Arc Quad  - 1 x daily - 7 x weekly - 2 sets - 10 reps - 5 hold - Seated Knee Extension Stretch with Chair  - 1 x  daily - 7 x weekly - 1 sets - 1 reps - work up to 30 min hold - Bridge with Hip Abduction and Resistance  - 1 x daily - 7 x weekly - 2 sets - 10 reps - Supine Active Straight Leg Raise  - 1 x daily - 7 x weekly - 2 sets - 10 reps - Sidelying Hip Abduction  - 1 x daily - 7 x weekly - 2 sets - 10 reps - Clamshell with Resistance  - 1 x daily - 7 x weekly - 2 sets - 10 reps - Side Stepping with Resistance at Ankles  - 1 x daily - 7 x weekly - 3 sets - 10 reps - Sit to Stand Without Arm Support  - 1 x daily - 7 x weekly - 2 sets - 10 reps - Squat with Chair Touch  - 1 x daily - 7 x weekly - 2 sets - 10 reps - Mini Lunge  - 1 x daily - 7 x weekly - 2 sets - 10 reps - Lateral Lunge  - 1 x daily - 7 x weekly - 2 sets - 10 reps ASSESSMENT:  CLINICAL IMPRESSION: Pt has met all goals and is independent with HEP.  She demonstrates improvements on all objective findings.She is well  motivated and compliant.  She should continue to do well.  We will DC at this time.    OBJECTIVE IMPAIRMENTS: Abnormal gait, decreased activity tolerance, decreased balance, decreased mobility, difficulty walking, decreased ROM, decreased strength, increased edema, increased fascial restrictions, increased muscle spasms, impaired flexibility, improper body mechanics, postural dysfunction, obesity, and pain.   ACTIVITY LIMITATIONS: carrying, lifting, bending, sitting, standing, squatting, sleeping, stairs, transfers, bed mobility, bathing, toileting, dressing, and caring for others  PARTICIPATION LIMITATIONS: meal prep, cleaning, laundry, driving, shopping, community activity, occupation, yard work, and church  PERSONAL FACTORS: Fitness, Past/current experiences, Profession, and 1-2 comorbidities: osteoporosis and anxiety are also affecting patient's functional outcome.   REHAB POTENTIAL: Good  CLINICAL DECISION MAKING: Stable/uncomplicated  EVALUATION COMPLEXITY: Low   GOALS: Goals reviewed with patient? Yes  SHORT  TERM GOALS: Target date: 07/28/2024   Patient will be independent with initial HEP  Baseline: Goal status:MET  8/1  2.  Pain report to be no greater than 4/10  Baseline:  Goal status: MET 08/09/24   LONG TERM GOALS: Target date: 09/23/2024   Patient to be independent with advanced HEP  Baseline:  Goal status: MET 09/20/24  2.  Patient to report pain no greater than 2/10  Baseline:  Goal status: MET 09/20/24  3.  Functional scores to improve by 2-3 seconds Baseline:  Goal status: MET 08/23/24  4.  LEFS score to improve to 55 Baseline:  Goal status: MET 08/23/24  5.  Patient to report 85% improvement in overall symptoms  Baseline:  Goal status: MET 08/19/24 (however, today she says 50% better)  6.  Patient to be able to ascend and descend steps without pain or no greater than 2/10  Baseline:  Goal status: MET 09/14/24  6.  Patient will complete 5STS in < or = to 11 sec due to improve LE strength. Baseline: 13.30 sec Goal status: MET 09/20/24    PLAN:  PT FREQUENCY: 1-2x/week  PT DURATION: 4 weeks  PLANNED INTERVENTIONS: 97110-Therapeutic exercises, 97530- Therapeutic activity, 97112- Neuromuscular re-education, 97535- Self Care, [id number]- Manual therapy, [phone]- Gait training, [phone]- Aquatic Therapy, [phone]- Electrical stimulation (unattended), [phone]- Electrical stimulation (manual), [id number]- Vasopneumatic device, Patient/Family education, Balance training, Stair training, Taping, Joint mobilization, Spinal mobilization, Compression bandaging, DME instructions, Cryotherapy, and Moist heat  PLAN FOR NEXT SESSION:  DC to [facility] B. Anthony Tamburo, PT 09/20/24 9:17 PM [facility] [address], [address] [place], [place] [address] Phone # [phone] Fax [phone]

## 2024-09-22 ENCOUNTER — Encounter: Admitting: Physical Therapy

## 2024-10-17 DIAGNOSIS — G5601 Carpal tunnel syndrome, right upper limb: Secondary | ICD-10-CM | POA: Diagnosis not present

## 2024-10-20 DIAGNOSIS — M170 Bilateral primary osteoarthritis of knee: Secondary | ICD-10-CM | POA: Diagnosis not present

## 2024-10-21 DIAGNOSIS — H25013 Cortical age-related cataract, bilateral: Secondary | ICD-10-CM | POA: Diagnosis not present

## 2024-10-21 DIAGNOSIS — H353130 Nonexudative age-related macular degeneration, bilateral, stage unspecified: Secondary | ICD-10-CM | POA: Diagnosis not present

## 2024-10-21 DIAGNOSIS — H40013 Open angle with borderline findings, low risk, bilateral: Secondary | ICD-10-CM | POA: Diagnosis not present

## 2024-10-21 DIAGNOSIS — H2513 Age-related nuclear cataract, bilateral: Secondary | ICD-10-CM | POA: Diagnosis not present

## 2024-10-21 DIAGNOSIS — H2511 Age-related nuclear cataract, right eye: Secondary | ICD-10-CM | POA: Diagnosis not present

## 2024-10-25 DIAGNOSIS — G5601 Carpal tunnel syndrome, right upper limb: Secondary | ICD-10-CM | POA: Diagnosis not present

## 2024-10-25 DIAGNOSIS — M79641 Pain in right hand: Secondary | ICD-10-CM | POA: Diagnosis not present

## 2024-11-01 DIAGNOSIS — Z0000 Encounter for general adult medical examination without abnormal findings: Secondary | ICD-10-CM | POA: Diagnosis not present

## 2024-11-01 DIAGNOSIS — Z23 Encounter for immunization: Secondary | ICD-10-CM | POA: Diagnosis not present

## 2024-11-24 DIAGNOSIS — Z6832 Body mass index (BMI) 32.0-32.9, adult: Secondary | ICD-10-CM | POA: Diagnosis not present

## 2024-11-24 DIAGNOSIS — R140 Abdominal distension (gaseous): Secondary | ICD-10-CM | POA: Diagnosis not present

## 2025-01-02 ENCOUNTER — Telehealth: Payer: Self-pay

## 2025-01-02 NOTE — Telephone Encounter (Signed)
 Auth Submission: NO AUTH NEEDED Site of care: Site of care: [facility] Payer: [employer] Medication & CPT/J Code(s) submitted: Prolia  (Denosumab ) [id number] Diagnosis Code:  Route of submission (phone, fax, portal): portal Phone # Fax # Auth type: Buy/Bill PB Units/visits requested: 60mg  x 2 doses Reference number:  Approval from: 01/02/25 to 12/14/25   Patient want to stay at [facility], she lives closer to this infusion center.

## 2025-03-06 ENCOUNTER — Ambulatory Visit
